# Patient Record
Sex: Male | Born: 1935 | Race: White | Hispanic: No | Smoking: Former smoker
Health system: Southern US, Community
[De-identification: ages and names within clinical notes are randomized; demographics above are authoritative.]

## PROBLEM LIST (undated history)

## (undated) DIAGNOSIS — E119 Type 2 diabetes mellitus without complications: Secondary | ICD-10-CM

## (undated) DIAGNOSIS — I10 Essential (primary) hypertension: Secondary | ICD-10-CM

## (undated) DIAGNOSIS — IMO0001 Reserved for inherently not codable concepts without codable children: Secondary | ICD-10-CM

## (undated) DIAGNOSIS — D649 Anemia, unspecified: Secondary | ICD-10-CM

## (undated) DIAGNOSIS — I350 Nonrheumatic aortic (valve) stenosis: Secondary | ICD-10-CM

## (undated) DIAGNOSIS — E785 Hyperlipidemia, unspecified: Secondary | ICD-10-CM

## (undated) DIAGNOSIS — I4891 Unspecified atrial fibrillation: Secondary | ICD-10-CM

## (undated) DIAGNOSIS — E7800 Pure hypercholesterolemia, unspecified: Secondary | ICD-10-CM

## (undated) DIAGNOSIS — I739 Peripheral vascular disease, unspecified: Secondary | ICD-10-CM

## (undated) DIAGNOSIS — M1990 Unspecified osteoarthritis, unspecified site: Secondary | ICD-10-CM

## (undated) HISTORY — PX: APPENDECTOMY: SHX54

---

## 2004-11-10 ENCOUNTER — Ambulatory Visit: Payer: Self-pay | Admitting: Family Medicine

## 2004-11-18 ENCOUNTER — Ambulatory Visit: Payer: Self-pay | Admitting: Family Medicine

## 2015-01-21 ENCOUNTER — Encounter: Payer: Self-pay | Admitting: Emergency Medicine

## 2015-01-21 ENCOUNTER — Emergency Department: Payer: Medicare Other

## 2015-01-21 ENCOUNTER — Inpatient Hospital Stay
Admission: EM | Admit: 2015-01-21 | Discharge: 2015-02-02 | DRG: 854 | Disposition: A | Discharge status: Skilled Nursing Facility | Payer: Medicare Other | Attending: Internal Medicine | Admitting: Internal Medicine

## 2015-01-21 ENCOUNTER — Inpatient Hospital Stay: Payer: Medicare Other

## 2015-01-21 DIAGNOSIS — E876 Hypokalemia: Secondary | ICD-10-CM | POA: Diagnosis not present

## 2015-01-21 DIAGNOSIS — L97512 Non-pressure chronic ulcer of other part of right foot with fat layer exposed: Secondary | ICD-10-CM | POA: Diagnosis present

## 2015-01-21 DIAGNOSIS — M1990 Unspecified osteoarthritis, unspecified site: Secondary | ICD-10-CM | POA: Diagnosis present

## 2015-01-21 DIAGNOSIS — E1140 Type 2 diabetes mellitus with diabetic neuropathy, unspecified: Secondary | ICD-10-CM | POA: Diagnosis present

## 2015-01-21 DIAGNOSIS — I10 Essential (primary) hypertension: Secondary | ICD-10-CM | POA: Diagnosis present

## 2015-01-21 DIAGNOSIS — E11621 Type 2 diabetes mellitus with foot ulcer: Secondary | ICD-10-CM | POA: Diagnosis present

## 2015-01-21 DIAGNOSIS — D329 Benign neoplasm of meninges, unspecified: Secondary | ICD-10-CM | POA: Diagnosis present

## 2015-01-21 DIAGNOSIS — E7800 Pure hypercholesterolemia, unspecified: Secondary | ICD-10-CM | POA: Diagnosis present

## 2015-01-21 DIAGNOSIS — R471 Dysarthria and anarthria: Secondary | ICD-10-CM | POA: Diagnosis present

## 2015-01-21 DIAGNOSIS — E785 Hyperlipidemia, unspecified: Secondary | ICD-10-CM | POA: Diagnosis present

## 2015-01-21 DIAGNOSIS — L02611 Cutaneous abscess of right foot: Secondary | ICD-10-CM | POA: Diagnosis present

## 2015-01-21 DIAGNOSIS — I70261 Atherosclerosis of native arteries of extremities with gangrene, right leg: Secondary | ICD-10-CM | POA: Diagnosis present

## 2015-01-21 DIAGNOSIS — Z79899 Other long term (current) drug therapy: Secondary | ICD-10-CM

## 2015-01-21 DIAGNOSIS — L03115 Cellulitis of right lower limb: Secondary | ICD-10-CM | POA: Diagnosis present

## 2015-01-21 DIAGNOSIS — R509 Fever, unspecified: Secondary | ICD-10-CM

## 2015-01-21 DIAGNOSIS — I350 Nonrheumatic aortic (valve) stenosis: Secondary | ICD-10-CM | POA: Diagnosis present

## 2015-01-21 DIAGNOSIS — Z87891 Personal history of nicotine dependence: Secondary | ICD-10-CM

## 2015-01-21 DIAGNOSIS — F329 Major depressive disorder, single episode, unspecified: Secondary | ICD-10-CM | POA: Diagnosis present

## 2015-01-21 DIAGNOSIS — Z7984 Long term (current) use of oral hypoglycemic drugs: Secondary | ICD-10-CM | POA: Diagnosis not present

## 2015-01-21 DIAGNOSIS — R296 Repeated falls: Secondary | ICD-10-CM | POA: Diagnosis present

## 2015-01-21 DIAGNOSIS — Z8042 Family history of malignant neoplasm of prostate: Secondary | ICD-10-CM | POA: Diagnosis not present

## 2015-01-21 DIAGNOSIS — R4781 Slurred speech: Secondary | ICD-10-CM

## 2015-01-21 DIAGNOSIS — A4101 Sepsis due to Methicillin susceptible Staphylococcus aureus: Secondary | ICD-10-CM | POA: Diagnosis present

## 2015-01-21 DIAGNOSIS — I70269 Atherosclerosis of native arteries of extremities with gangrene, unspecified extremity: Secondary | ICD-10-CM

## 2015-01-21 DIAGNOSIS — L03031 Cellulitis of right toe: Secondary | ICD-10-CM

## 2015-01-21 DIAGNOSIS — L0390 Cellulitis, unspecified: Secondary | ICD-10-CM | POA: Diagnosis present

## 2015-01-21 DIAGNOSIS — D638 Anemia in other chronic diseases classified elsewhere: Secondary | ICD-10-CM | POA: Diagnosis present

## 2015-01-21 DIAGNOSIS — I96 Gangrene, not elsewhere classified: Secondary | ICD-10-CM

## 2015-01-21 DIAGNOSIS — Z95828 Presence of other vascular implants and grafts: Secondary | ICD-10-CM

## 2015-01-21 DIAGNOSIS — H9190 Unspecified hearing loss, unspecified ear: Secondary | ICD-10-CM | POA: Diagnosis present

## 2015-01-21 DIAGNOSIS — E1152 Type 2 diabetes mellitus with diabetic peripheral angiopathy with gangrene: Secondary | ICD-10-CM | POA: Diagnosis present

## 2015-01-21 DIAGNOSIS — I739 Peripheral vascular disease, unspecified: Secondary | ICD-10-CM

## 2015-01-21 DIAGNOSIS — Z7982 Long term (current) use of aspirin: Secondary | ICD-10-CM

## 2015-01-21 DIAGNOSIS — L97509 Non-pressure chronic ulcer of other part of unspecified foot with unspecified severity: Secondary | ICD-10-CM

## 2015-01-21 DIAGNOSIS — I4891 Unspecified atrial fibrillation: Secondary | ICD-10-CM | POA: Diagnosis not present

## 2015-01-21 HISTORY — DX: Type 2 diabetes mellitus without complications: E11.9

## 2015-01-21 HISTORY — DX: Essential (primary) hypertension: I10

## 2015-01-21 HISTORY — DX: Pure hypercholesterolemia, unspecified: E78.00

## 2015-01-21 HISTORY — DX: Unspecified osteoarthritis, unspecified site: M19.90

## 2015-01-21 LAB — CBC WITH DIFFERENTIAL/PLATELET
BAND NEUTROPHILS: 5 %
BASOS PCT: 0 %
BLASTS: 0 %
Basophils Absolute: 0 10*3/uL (ref 0–0.1)
Eosinophils Absolute: 0 10*3/uL (ref 0–0.7)
Eosinophils Relative: 0 %
HCT: 35.8 % — ABNORMAL LOW (ref 40.0–52.0)
Hemoglobin: 10.9 g/dL — ABNORMAL LOW (ref 13.0–18.0)
LYMPHS PCT: 6 %
Lymphs Abs: 1.8 10*3/uL (ref 1.0–3.6)
MCH: 22.5 pg — AB (ref 26.0–34.0)
MCHC: 30.3 g/dL — ABNORMAL LOW (ref 32.0–36.0)
MCV: 74.3 fL — AB (ref 80.0–100.0)
METAMYELOCYTES PCT: 0 %
MYELOCYTES: 0 %
Monocytes Absolute: 0.6 10*3/uL (ref 0.2–1.0)
Monocytes Relative: 2 %
Neutro Abs: 28 10*3/uL — ABNORMAL HIGH (ref 1.4–6.5)
Neutrophils Relative %: 87 %
Other: 0 %
PLATELETS: 308 10*3/uL (ref 150–440)
PROMYELOCYTES ABS: 0 %
RBC: 4.81 MIL/uL (ref 4.40–5.90)
RDW: 18.4 % — AB (ref 11.5–14.5)
WBC: 30.4 10*3/uL — ABNORMAL HIGH (ref 3.8–10.6)
nRBC: 0 /100 WBC

## 2015-01-21 LAB — URINALYSIS COMPLETE WITH MICROSCOPIC (ARMC ONLY)
Bacteria, UA: NONE SEEN
Bilirubin Urine: NEGATIVE
GLUCOSE, UA: NEGATIVE mg/dL
Leukocytes, UA: NEGATIVE
Nitrite: NEGATIVE
Protein, ur: 100 mg/dL — AB
Specific Gravity, Urine: 1.024 (ref 1.005–1.030)
pH: 5 (ref 5.0–8.0)

## 2015-01-21 LAB — BASIC METABOLIC PANEL
ANION GAP: 13 (ref 5–15)
BUN: 23 mg/dL — AB (ref 6–20)
CALCIUM: 8.6 mg/dL — AB (ref 8.9–10.3)
CO2: 21 mmol/L — AB (ref 22–32)
CREATININE: 0.94 mg/dL (ref 0.61–1.24)
Chloride: 104 mmol/L (ref 101–111)
GFR calc Af Amer: >60 mL/min (ref >60)
GLUCOSE: 159 mg/dL — AB (ref 65–99)
Potassium: 4.2 mmol/L (ref 3.5–5.1)
Sodium: 138 mmol/L (ref 135–145)

## 2015-01-21 LAB — GLUCOSE, CAPILLARY: GLUCOSE-CAPILLARY: 154 mg/dL — AB (ref 65–99)

## 2015-01-21 MED ORDER — PIPERACILLIN-TAZOBACTAM 3.375 G IVPB
3.3750 g | Freq: Three times a day (TID) | INTRAVENOUS | Status: DC
  Administered 2015-01-22 – 2015-02-02 (×33): 3.375 g via INTRAVENOUS
  Filled 2015-01-21 (×38): qty 50

## 2015-01-21 MED ORDER — VANCOMYCIN HCL 10 G IV SOLR
1500.0000 mg | INTRAVENOUS | Status: DC
  Administered 2015-01-22: 1500 mg via INTRAVENOUS
  Filled 2015-01-21 (×3): qty 1500

## 2015-01-21 MED ORDER — ONDANSETRON HCL 4 MG PO TABS: 4.0000 mg | ORAL_TABLET | Freq: Four times a day (QID) | ORAL | Status: DC | PRN

## 2015-01-21 MED ORDER — FUROSEMIDE 40 MG PO TABS
40.0000 mg | ORAL_TABLET | Freq: Every day | ORAL | Status: DC
  Administered 2015-01-22 – 2015-02-02 (×10): 40 mg via ORAL
  Filled 2015-01-21 (×10): qty 1

## 2015-01-21 MED ORDER — DULOXETINE HCL 20 MG PO CPEP
20.0000 mg | ORAL_CAPSULE | Freq: Every day | ORAL | Status: DC
  Administered 2015-01-21 – 2015-02-02 (×12): 20 mg via ORAL
  Filled 2015-01-21 (×12): qty 1

## 2015-01-21 MED ORDER — ONDANSETRON HCL 4 MG/2ML IJ SOLN: 4.0000 mg | Freq: Once | INTRAMUSCULAR | Status: DC

## 2015-01-21 MED ORDER — ACETAMINOPHEN 650 MG RE SUPP
650.0000 mg | Freq: Four times a day (QID) | RECTAL | Status: DC | PRN
Start: 2015-01-21 — End: 2015-02-02

## 2015-01-21 MED ORDER — ASPIRIN EC 81 MG PO TBEC
81.0000 mg | DELAYED_RELEASE_TABLET | Freq: Every day | ORAL | Status: DC
  Administered 2015-01-21 – 2015-02-02 (×12): 81 mg via ORAL
  Filled 2015-01-21 (×12): qty 1

## 2015-01-21 MED ORDER — DOCUSATE SODIUM 100 MG PO CAPS
100.0000 mg | ORAL_CAPSULE | Freq: Every day | ORAL | Status: DC
  Administered 2015-01-21 – 2015-02-02 (×11): 100 mg via ORAL
  Filled 2015-01-21 (×12): qty 1

## 2015-01-21 MED ORDER — ONDANSETRON HCL 4 MG/2ML IJ SOLN: 4.0000 mg | Freq: Four times a day (QID) | INTRAMUSCULAR | Status: DC | PRN

## 2015-01-21 MED ORDER — INSULIN ASPART 100 UNIT/ML ~~LOC~~ SOLN
0.0000 [IU] | Freq: Three times a day (TID) | SUBCUTANEOUS | Status: DC
  Administered 2015-01-22 (×3): 1 [IU] via SUBCUTANEOUS
  Administered 2015-01-23 – 2015-01-25 (×4): 3 [IU] via SUBCUTANEOUS
  Administered 2015-01-25: 2 [IU] via SUBCUTANEOUS
  Administered 2015-01-26: 3 [IU] via SUBCUTANEOUS
  Administered 2015-01-26: 2 [IU] via SUBCUTANEOUS
  Administered 2015-01-27 (×2): 1 [IU] via SUBCUTANEOUS
  Administered 2015-01-28: 3 [IU] via SUBCUTANEOUS
  Administered 2015-01-28: 5 [IU] via SUBCUTANEOUS
  Administered 2015-01-28 – 2015-01-29 (×2): 1 [IU] via SUBCUTANEOUS
  Administered 2015-01-29: 3 [IU] via SUBCUTANEOUS
  Filled 2015-01-21: qty 3
  Filled 2015-01-21 (×4): qty 1
  Filled 2015-01-21: qty 5
  Filled 2015-01-21: qty 1
  Filled 2015-01-21 (×3): qty 3
  Filled 2015-01-21 (×2): qty 1
  Filled 2015-01-21: qty 2
  Filled 2015-01-21: qty 1
  Filled 2015-01-21: qty 3
  Filled 2015-01-21: qty 2
  Filled 2015-01-21 (×2): qty 3

## 2015-01-21 MED ORDER — PIPERACILLIN-TAZOBACTAM 3.375 G IVPB
3.3750 g | Freq: Once | INTRAVENOUS | Status: AC
  Administered 2015-01-21: 3.375 g via INTRAVENOUS
  Filled 2015-01-21: qty 50

## 2015-01-21 MED ORDER — VANCOMYCIN HCL IN DEXTROSE 1-5 GM/200ML-% IV SOLN
1000.0000 mg | Freq: Once | INTRAVENOUS | Status: AC
  Administered 2015-01-21: 1000 mg via INTRAVENOUS
  Filled 2015-01-21: qty 200

## 2015-01-21 MED ORDER — ACETAMINOPHEN 325 MG PO TABS: 650.0000 mg | ORAL_TABLET | Freq: Four times a day (QID) | ORAL | Status: DC | PRN

## 2015-01-21 MED ORDER — GI COCKTAIL ~~LOC~~: 30.0000 mL | Freq: Once | ORAL | Status: DC

## 2015-01-21 MED ORDER — VANCOMYCIN HCL 500 MG IV SOLR
500.0000 mg | INTRAVENOUS | Status: AC
  Administered 2015-01-22: 500 mg via INTRAVENOUS
  Filled 2015-01-21 (×2): qty 500

## 2015-01-21 MED ORDER — SIMVASTATIN 20 MG PO TABS
20.0000 mg | ORAL_TABLET | Freq: Every day | ORAL | Status: DC
  Administered 2015-01-21 – 2015-02-02 (×12): 20 mg via ORAL
  Filled 2015-01-21 (×12): qty 1

## 2015-01-21 MED ORDER — INSULIN ASPART 100 UNIT/ML ~~LOC~~ SOLN
0.0000 [IU] | Freq: Every day | SUBCUTANEOUS | Status: DC
  Administered 2015-01-28: 2 [IU] via SUBCUTANEOUS
  Filled 2015-01-21: qty 2

## 2015-01-21 MED ORDER — ENOXAPARIN SODIUM 40 MG/0.4ML ~~LOC~~ SOLN
40.0000 mg | SUBCUTANEOUS | Status: DC
Start: 2015-01-21 — End: 2015-01-24
  Administered 2015-01-21 – 2015-01-24 (×3): 40 mg via SUBCUTANEOUS
  Filled 2015-01-21 (×3): qty 0.4

## 2015-01-21 MED ORDER — LORAZEPAM 2 MG/ML IJ SOLN: 0.5000 mg | Freq: Once | INTRAMUSCULAR | Status: DC

## 2015-01-21 MED ORDER — SODIUM CHLORIDE 0.9 % IV BOLUS (SEPSIS): 1000.0000 mL | Freq: Once | INTRAVENOUS | Status: DC

## 2015-01-21 NOTE — ED Notes (Signed)
Admitting Dr. At bedside

## 2015-01-21 NOTE — H&P (Signed)
[facility] NAME: Spencer [name]    MR#:  [id number]  DATE OF BIRTH:  1917-12-19  DATE OF ADMISSION:  01/21/2015  PRIMARY CARE PHYSICIAN: Spencer Pink, MD   REQUESTING/REFERRING PHYSICIAN: Dr. Lisa [name]  CHIEF COMPLAINT:   Chief Complaint  Patient presents with  . Fall  . Weakness    HISTORY OF PRESENT ILLNESS:  Spencer [name]  is a 82 y.o. male with a known history of diabetes type 2 without complication, hypertension, osteoarthritis, hyperlipidemia who presents to the hospital due to recurrent falls and also noted to have right foot pain redness and swelling. She presented to the emergency room was noted to have a right foot cellulitis in a diabetic foot ulcer and hospitalist services were contacted further treatment and evaluation. Patient is very hard of hearing therefore the history is very limited. She did have a low-grade fever at the assisted living of 100.0. His any nausea, vomiting, abdominal pain, diarrhea, chest pain or any other associated symptoms. He did say that he's been having some right foot pain and swelling ongoing for the past week to 10 days but he did not notify anyone affect. He presented to the ER and was noted to have an acute cellulitis with a diabetic foot infection and hospice services were contacted further treatment and evaluation.  PAST MEDICAL HISTORY:   Past Medical History  Diagnosis Date  . Diabetes mellitus without complication ([place])   . Hypertension   . Arthritis   . High cholesterol     PAST SURGICAL HISTORY:   Past Surgical History  Procedure Laterality Date  . Appendectomy      SOCIAL HISTORY:   Social History  Substance Use Topics  . Smoking status: Former Research scientist (life sciences)  . Smokeless tobacco: Never Used  . Alcohol Use: No    FAMILY HISTORY:   Family History  Problem Relation Age of Onset  . Prostate cancer Father     DRUG ALLERGIES:  No Known Allergies  REVIEW  OF SYSTEMS:   Review of Systems  Constitutional: Negative for fever and weight loss.  HENT: Negative for congestion, nosebleeds and tinnitus.   Eyes: Negative for blurred vision, double vision and redness.  Respiratory: Negative for cough, hemoptysis and shortness of breath.   Cardiovascular: Negative for chest pain, orthopnea, leg swelling and PND.  Gastrointestinal: Negative for nausea, vomiting, abdominal pain, diarrhea and melena.  Genitourinary: Negative for dysuria, urgency and hematuria.  Musculoskeletal: Positive for falls. Negative for joint pain.  Neurological: Positive for weakness. Negative for dizziness, tingling, sensory change, focal weakness, seizures and headaches.  Endo/Heme/Allergies: Negative for polydipsia. Does not bruise/bleed easily.  Psychiatric/Behavioral: Negative for depression and memory loss. The patient is not nervous/anxious.     MEDICATIONS AT HOME:   Prior to Admission medications   Medication Sig Start Date End Date Taking? Authorizing Provider  aspirin EC 81 MG tablet Take 81 mg by mouth daily.   Yes Historical Provider, MD  celecoxib (CELEBREX) 200 MG capsule Take 200 mg by mouth daily as needed for mild pain.   Yes Historical Provider, MD  docusate sodium (COLACE) 100 MG capsule Take 100 mg by mouth daily.   Yes Historical Provider, MD  DULoxetine (CYMBALTA) 20 MG capsule Take 20 mg by mouth daily.   Yes Historical Provider, MD  furosemide (LASIX) 40 MG tablet Take 40 mg by mouth daily.   Yes Historical Provider, MD  metFORMIN (GLUCOPHAGE-XR) 500 MG 24 hr tablet  Take 500 mg by mouth daily.   Yes Historical Provider, MD  simvastatin (ZOCOR) 20 MG tablet Take 20 mg by mouth daily.   Yes Historical Provider, MD      VITAL SIGNS:  Blood pressure 133/54, pulse 74, temperature 97.5 F (36.4 C), temperature source Oral, resp. rate 20, height 6' (1.829 m), weight 95.255 kg (210 lb), SpO2 96 %.  PHYSICAL EXAMINATION:  Physical Exam  GENERAL:  82  y.o.-year-old patient lying in the bed with no acute distress.  EYES: Pupils equal, round, reactive to light and accommodation. No scleral icterus. Extraocular muscles intact.  HEENT: Head atraumatic, normocephalic. Oropharynx and nasopharynx clear. No oropharyngeal erythema, moist oral mucosa  NECK:  Supple, no jugular venous distention. No thyroid enlargement, no tenderness.  LUNGS: Normal breath sounds bilaterally, no wheezing, rales, rhonchi. No use of accessory muscles of respiration.  CARDIOVASCULAR: S1, S2 RRR.  2/6 systolic ejection murmur at the left sternal border, No rubs, gallops, clicks.  ABDOMEN: Soft, nontender, nondistended. Bowel sounds present. No organomegaly or mass.  EXTREMITIES: No pedal edema, cyanosis, or clubbing. + 2 pedal & radial pulses b/l. Right foot swelling and redness. There is a open sore with a fluid collection on the plantar aspect of the right foot.  NEUROLOGIC: Cranial nerves II through XII are intact. No focal Motor or sensory deficits appreciated b/l PSYCHIATRIC: The patient is alert and oriented x 3. Good affect.  SKIN: No obvious rash, lesion, right lower extremity cellulitis with a open sore on the right foot w/ some serosangenous drainage.    LABORATORY PANEL:   CBC  Recent Labs Lab 01/21/15 1700  WBC 30.4*  HGB 10.9*  HCT 35.8*  PLT 308   ------------------------------------------------------------------------------------------------------------------  Chemistries   Recent Labs Lab 01/21/15 1700  NA 138  K 4.2  CL 104  CO2 21*  GLUCOSE 159*  BUN 23*  CREATININE 0.94  CALCIUM 8.6*   ------------------------------------------------------------------------------------------------------------------  Cardiac Enzymes No results for input(s): TROPONINI in the last 168 hours. ------------------------------------------------------------------------------------------------------------------  RADIOLOGY:  Dg Foot Complete  Right  01/21/2015  CLINICAL DATA:  Diabetic foot ulcers with pain, swelling and redness. EXAM: RIGHT FOOT COMPLETE - 3+ VIEW COMPARISON:  None. FINDINGS: Diffuse soft tissue swelling/edema involving the foot and ankle. Extensive vascular calcifications. There are moderate degenerative changes but no definite destructive bony changes to suggest osteomyelitis. Pes cavus noted. IMPRESSION: Degenerative changes but no definite plain film findings for osteomyelitis. Diffuse soft tissue swelling/edema. Electronically Signed   By: Marijo Sanes M.D.   On: 01/21/2015 18:45     IMPRESSION AND PLAN:   82 year old male with past medical history of diabetes, hypertension, diabetic neuropathy, hyperlipidemia, history of arthritis of presents to the hospital due to falls and also noted to have a right foot cellulitis and diabetic foot ulcer.  #1 right foot cellulitis with diabetic foot ulcer-this is the cause of patient's redness swelling on the foot. -I will treat the patient with IV antibiotics with vancomycin and Zosyn. Follow blood cultures. -Patient's x-rays negative for any evidence of osteomyelitis.I will get a podiatry consult in the morning. -Continue supportive care.  #2 sepsis-patient was then given the right foot cellulitis, tachycardia, leukocytosis. -Continue IV antibiotics as mentioned above. Follow blood cultures.  #3 leukocytosis-secondary to the site of sepsis/cellulitis. -Follow WBC count after antibiotic therapy.  #4 diabetes type 2 without, dictation-hold metformin. Continue sliding scale insulin for now.  #5 Depression - cont. Cymbalta.   #6 Hyperlipidemia - cont. Simvastatin.    All  the records are reviewed and case discussed with ED provider. Management plans discussed with the patient, family and they are in agreement.  CODE STATUS: Full  TOTAL TIME TAKING CARE OF THIS PATIENT: 45 minutes.    Henreitta Leber M.D on 01/21/2015 at 8:28 PM  Between 7am to 6pm - Pager -  [phone]  After 6pm go to [email or web address] - password [email or web address] [facility]  [facility]  Office  [phone]  CC: Primary care physician; Spencer Pink, MD

## 2015-01-21 NOTE — ED Provider Notes (Signed)
[facility] Emergency Department Provider Note   ____________________________________________  Time seen: Approximately 6 PM I have reviewed the triage vital signs and the triage nursing note.  HISTORY  Chief Complaint Fall and Weakness   Historian Patient  HPI Spencer [name] is a 82 y.o. male who is hard of hearing, but lives at [facility], is here after he had a fall which she describes as losing his balance when he leaned forward. He was unable to get up and so EMS was called. After evaluating him and seeing the state of his right foot, he was brought to the ED for further evaluation. Patient states his foot has been red for about a week, and the area over the ball the foot has been greenish/whitest since yesterday. He does have some pain in that foot. He denies a fever or nausea. Nodes are moderate.    Past Medical History  Diagnosis Date  . Diabetes mellitus without complication ([place])   . Hypertension   . Arthritis   . High cholesterol     There are no active problems to display for this patient.   Past Surgical History  Procedure Laterality Date  . Appendectomy      No current outpatient prescriptions on file.  Allergies Review of patient's allergies indicates no known allergies.  History reviewed. No pertinent family history.  Social History Social History  Substance Use Topics  . Smoking status: Former Research scientist (life sciences)  . Smokeless tobacco: Never Used  . Alcohol Use: No    Review of Systems  Constitutional: Negative for fever. Eyes: Negative for visual changes. ENT: Negative for sore throat. Cardiovascular: Negative for chest pain. Respiratory: Negative for shortness of breath. Gastrointestinal: Negative for abdominal pain, vomiting and diarrhea. Genitourinary: Negative for dysuria. Musculoskeletal: Negative for back pain. Skin: Redness to right foot with swelling Neurological: Negative for headache. 10 point  Review of Systems otherwise negative ____________________________________________   PHYSICAL EXAM:  VITAL SIGNS: ED Triage Vitals  Enc Vitals Group     BP 01/21/15 1700 140/67 mmHg     Pulse Rate 01/21/15 1700 74     Resp 01/21/15 1700 22     Temp 01/21/15 1709 97.5 F (36.4 C)     Temp Source 01/21/15 1709 Oral     SpO2 01/21/15 1700 97 %     Weight 01/21/15 1709 210 lb (95.255 kg)     Height 01/21/15 1709 6' (1.829 m)     Head Cir --      Peak Flow --      Pain Score --      Pain Loc --      Pain Edu? --      Excl. in [place]? --      Constitutional: Alert and oriented. Heart of hearing. Well appearing overall and in no distress. Eyes: Conjunctivae are normal. PERRL. Normal extraocular movements. ENT   Head: Normocephalic and atraumatic.   Nose: No congestion/rhinnorhea.   Mouth/Throat: Mucous membranes are moist.   Neck: No stridor. Cardiovascular/Chest: Normal rate, regular rhythm.  No murmurs, rubs, or gallops. Respiratory: Normal respiratory effort without tachypnea nor retractions. Breath sounds are clear and equal bilaterally. No wheezes/rales/rhonchi. Gastrointestinal: Soft. No distention, no guarding, no rebound. Nontender.  Genitourinary/rectal:Deferred Musculoskeletal: Right foot cellulitis with erythema, warmth, and swelling from the foot up to the mid calf. Right ball of the foot with necrotic/gangrenous area. Neurologic:  Normal speech and language. No gross or focal neurologic deficits are appreciated. Skin:  See  musculoskeletal exam Psychiatric: Mood and affect are normal. Speech and behavior are normal. Patient exhibits appropriate insight and judgment.  ____________________________________________   EKG I, Lisa Roca, MD, the attending physician have personally viewed and interpreted all ECGs.  Undetermined irregular rhythm with nonspecific intraventricular conduction delay. Normal axis. Nonspecific ST and  T-wave ____________________________________________  LABS (pertinent positives/negatives)  Urinalysis negative for UTI Basic metabolic panel without significant abnormality  White blood count 30.4, hemoglobin 10.9 ____________________________________________  RADIOLOGY All Xrays were viewed by me. Imaging interpreted by Radiologist.  X-ray right foot: Degenerative changes but no definite plain film findings prostate myelitis. Diffuse soft tissue swelling/edema  Portable chest 1 view: No active disease __________________________________________  PROCEDURES  Procedure(s) performed: None  Critical Care performed: None  ____________________________________________   ED COURSE / ASSESSMENT AND PLAN  CONSULTATIONS: Hospitalist for admission  Pertinent labs & imaging results that were available during my care of the patient were reviewed by me and considered in my medical decision making (see chart for details).  The initial complaint was multiple falls, but no reported head injuries, nor evidence of traumatic injury on exam.  Of more concern is the right foot cellulitis with an area of necrosis/gangrene. Low-grade temperature on exam. Patient was treated with vancomycin and Zosyn IV and will be admitted for further treatment and management. He will need a podiatry consultation tomorrow with likely debridement. He does not appear to be septic, but blood cultures were sent.    Patient / Family / Caregiver informed of clinical course, medical decision-making process, and agree with plan.  ___________________________________________   FINAL CLINICAL IMPRESSION(S) / ED DIAGNOSES   Final diagnoses:  Cellulitis of right foot  Gangrene of foot ([place])              Note: This dictation was prepared with Dragon dictation. Any transcriptional errors that result from this process are unintentional   Lisa Roca, MD 01/21/15 2336

## 2015-01-21 NOTE — ED Notes (Signed)
Tried to pull Zosyn from pyxis and was unable. Called pharmacy and they are sending it to ED.

## 2015-01-21 NOTE — ED Notes (Signed)
Pt presents via [facility] from [facility]. Per EMS pt fell today and has had several recent episodes of falls. EMS reports a temp of 100 degrees. EMS states that per [place] pt has redness noted to R leg and foot as well as weakness. Pt is hard of hearing at this time and has 2 hearing aids.

## 2015-01-21 NOTE — ED Notes (Signed)
Report given to Kate, RN

## 2015-01-21 NOTE — Progress Notes (Signed)
ANTIBIOTIC CONSULT NOTE - INITIAL  Pharmacy Consult for Vancomycin/Zosyn  Indication: cellulitis, sepsis  No Known Allergies  Patient Measurements: Height: 5\' 6"  (167.6 cm) Weight: 161 lb 12.8 oz (73.392 kg) IBW/kg (Calculated) : 63.8 Adjusted Body Weight: 84.7 kg   Vital Signs: Temp: 98.6 F (37 C) (01/11 2119) Temp Source: Oral (01/11 1709) BP: 161/40 mmHg (01/11 2119) Pulse Rate: 78 (01/11 2119) Intake/Output from previous day:   Intake/Output from this shift: Total I/O In: -  Out: 175 [Urine:175]  Labs:  Recent Labs  01/21/15 1700  WBC 30.4*  HGB 10.9*  PLT 308  CREATININE 0.94   Estimated Creatinine Clearance: 42.4 mL/min (by C-G formula based on Cr of 0.94). No results for input(s): VANCOTROUGH, VANCOPEAK, VANCORANDOM, GENTTROUGH, GENTPEAK, GENTRANDOM, TOBRATROUGH, TOBRAPEAK, TOBRARND, AMIKACINPEAK, AMIKACINTROU, AMIKACIN in the last 72 hours.   Microbiology: No results found for this or any previous visit (from the past 720 hour(s)).  Medical History: Past Medical History  Diagnosis Date  . Diabetes mellitus without complication ([place])   . Hypertension   . Arthritis   . High cholesterol     Medications:  Prescriptions prior to admission  Medication Sig Dispense Refill Last Dose  . aspirin EC 81 MG tablet Take 81 mg by mouth daily.   unknown at unknown  . celecoxib (CELEBREX) 200 MG capsule Take 200 mg by mouth daily as needed for mild pain.   PRN at PRN  . docusate sodium (COLACE) 100 MG capsule Take 100 mg by mouth daily.   unknown at unknown  . DULoxetine (CYMBALTA) 20 MG capsule Take 20 mg by mouth daily.   unknown at unknown  . furosemide (LASIX) 40 MG tablet Take 40 mg by mouth daily.   unknown at unknown   . metFORMIN (GLUCOPHAGE-XR) 500 MG 24 hr tablet Take 500 mg by mouth daily.   unknown at unknown  . simvastatin (ZOCOR) 20 MG tablet Take 20 mg by mouth daily.   unknown at unknown   Assessment: No Pseudomonas risk factors noted.  CrCl =  42.4 ml/min Ke = 0.05hr-1 T1/2 = 13.9 hrs Vd = 66.7 hrs  Goal of Therapy:  Vancomycin trough level 15-20 mcg/ml  Plan:  Expected duration 7 days with resolution of temperature and/or normalization of WBC   Zosyn 3.375 gm IV X 1 given on 1/11 @ 20:00. Zosyn 3.375 gm IV Q8H EI ordered to start 1/12 @ 4:00.  Vancomycin 1 gm IV X 1 given on 1/11 @ 19:00. Vancomycin 500 mg IV X 1 ordered to be given following initial 1 gm dose to make total loading dose of 1500 mg. Vancomycin 1500 mg IV X Q18H ordered to start on 1/12 @ 4:00, ~ 9 hrs after 1st dose (stacked dosing). This pt will reach Css by 1/14 @ 19:00. Will draw 1st trough on 1/14 @ 9:30, which will be very close to Css.   Timesha Cervantez D 01/21/2015,9:38 PM

## 2015-01-22 LAB — GLUCOSE, CAPILLARY
GLUCOSE-CAPILLARY: 132 mg/dL — AB (ref 65–99)
GLUCOSE-CAPILLARY: 140 mg/dL — AB (ref 65–99)
GLUCOSE-CAPILLARY: 154 mg/dL — AB (ref 65–99)
Glucose-Capillary: 142 mg/dL — ABNORMAL HIGH (ref 65–99)

## 2015-01-22 LAB — BASIC METABOLIC PANEL
ANION GAP: 7 (ref 5–15)
BUN: 19 mg/dL (ref 6–20)
CALCIUM: 7.9 mg/dL — AB (ref 8.9–10.3)
CO2: 22 mmol/L (ref 22–32)
CREATININE: 0.87 mg/dL (ref 0.61–1.24)
Chloride: 109 mmol/L (ref 101–111)
GLUCOSE: 128 mg/dL — AB (ref 65–99)
Potassium: 4 mmol/L (ref 3.5–5.1)
Sodium: 138 mmol/L (ref 135–145)

## 2015-01-22 LAB — CBC
HCT: 31.4 % — ABNORMAL LOW (ref 40.0–52.0)
HEMOGLOBIN: 9.6 g/dL — AB (ref 13.0–18.0)
MCH: 22.5 pg — AB (ref 26.0–34.0)
MCHC: 30.7 g/dL — AB (ref 32.0–36.0)
MCV: 73.2 fL — ABNORMAL LOW (ref 80.0–100.0)
PLATELETS: 299 10*3/uL (ref 150–440)
RBC: 4.28 MIL/uL — AB (ref 4.40–5.90)
RDW: 17.6 % — ABNORMAL HIGH (ref 11.5–14.5)
WBC: 22.9 10*3/uL — ABNORMAL HIGH (ref 3.8–10.6)

## 2015-01-22 MED ORDER — ENSURE ENLIVE PO LIQD
237.0000 mL | Freq: Three times a day (TID) | ORAL | Status: DC
  Administered 2015-01-22 – 2015-02-02 (×25): 237 mL via ORAL

## 2015-01-22 MED ORDER — SODIUM CHLORIDE 0.9 % IV SOLN
1500.0000 mg | INTRAVENOUS | Status: DC
  Administered 2015-01-23: 1500 mg via INTRAVENOUS
  Filled 2015-01-22: qty 1500

## 2015-01-22 NOTE — Care Management Note (Signed)
Case Management Note  Patient Details  Name: Spencer [name] MRN: [id number] Date of Birth: 12/31/1917  Subjective/Objective:                  Met with patient to discuss discharge planning. He is admitted for cellulitis so RNCM/CSW will follow for wound care and IV ABX at discharge. He states he is "independent with one cane in his car (he drives) and one cane in the house". His PCP is Dr. Verneda Skill. He lives at [facility] alone- wife passed away in 03/17/11 per patient. He has a son that visited last night he said and he lives in [place].   Action/Plan: RNCM will continue to follow. I have updated CSW.   Expected Discharge Date:                  Expected Discharge Plan:     In-House Referral:  Clinical Social Work  Discharge planning Services  CM Consult  Post Acute Care Choice:    Choice offered to:  Patient  DME Arranged:    DME Agency:     HH Arranged:    [place] Agency:     Status of Service:  In process, will continue to follow  Medicare Important Message Given:    Date Medicare IM Given:    Medicare IM give by:    Date Additional Medicare IM Given:    Additional Medicare Important Message give by:     If discussed at [place] of Stay Meetings, dates discussed:    Additional Comments:  Marshell Garfinkel, RN 01/22/2015, 9:48 AM

## 2015-01-22 NOTE — Consult Note (Signed)
ORTHOPAEDIC CONSULTATION  REQUESTING PHYSICIAN: Bettey Costa, MD  Chief Complaint: Right great toe abscess  HPI: Spencer [name] is a 82 y.o. male who complains of  redness and swelling to his right great toe. Son presents with him today and provides the review of system. Noticed a couple days ago pain and swelling to his right great toe joint. A recent shortness of breath occurred and he was brought to the ER. At that Time they noticed the redness and swelling to his right great toe and admitted for diabetic foot infection.  Past Medical History  Diagnosis Date  . Diabetes mellitus without complication ([place])   . Hypertension   . Arthritis   . High cholesterol    Past Surgical History  Procedure Laterality Date  . Appendectomy     Social History   Social History  . Marital Status: Widowed    Spouse Name: N/A  . Number of Children: N/A  . Years of Education: N/A   Social History Main Topics  . Smoking status: Former Research scientist (life sciences)  . Smokeless tobacco: Never Used  . Alcohol Use: No  . Drug Use: No  . Sexual Activity: Not Asked   Other Topics Concern  . None   Social History Narrative   Family History  Problem Relation Age of Onset  . Prostate cancer Father    No Known Allergies Prior to Admission medications   Medication Sig Start Date End Date Taking? Authorizing Provider  aspirin EC 81 MG tablet Take 81 mg by mouth daily.   Yes Historical Provider, MD  celecoxib (CELEBREX) 200 MG capsule Take 200 mg by mouth daily as needed for mild pain.   Yes Historical Provider, MD  docusate sodium (COLACE) 100 MG capsule Take 100 mg by mouth daily.   Yes Historical Provider, MD  DULoxetine (CYMBALTA) 20 MG capsule Take 20 mg by mouth daily.   Yes Historical Provider, MD  furosemide (LASIX) 40 MG tablet Take 40 mg by mouth daily.   Yes Historical Provider, MD  metFORMIN (GLUCOPHAGE-XR) 500 MG 24 hr tablet Take 500 mg by mouth daily.   Yes Historical Provider, MD  simvastatin (ZOCOR)  20 MG tablet Take 20 mg by mouth daily.   Yes Historical Provider, MD   Dg Chest [facility] 1 View  01/21/2015  CLINICAL DATA:  Chest pain. Fever. Recent fall. Diabetes and hypertension. EXAM: PORTABLE CHEST 1 VIEW COMPARISON:  None. FINDINGS: The heart size and mediastinal contours are within normal limits. Both lungs are clear. No evidence of pneumothorax or pleural effusion. IMPRESSION: No active disease. Electronically Signed   By: Earle Gell M.D.   On: 01/21/2015 21:14   Dg Foot Complete Right  01/21/2015  CLINICAL DATA:  Diabetic foot ulcers with pain, swelling and redness. EXAM: RIGHT FOOT COMPLETE - 3+ VIEW COMPARISON:  None. FINDINGS: Diffuse soft tissue swelling/edema involving the foot and ankle. Extensive vascular calcifications. There are moderate degenerative changes but no definite destructive bony changes to suggest osteomyelitis. Pes cavus noted. IMPRESSION: Degenerative changes but no definite plain film findings for osteomyelitis. Diffuse soft tissue swelling/edema. Electronically Signed   By: Marijo Sanes M.D.   On: 01/21/2015 18:45    Positive ROS: All other systems have been reviewed and were otherwise negative with the exception of those mentioned in the HPI and as above.  12 point ROS was performed.  Physical Exam: General: Alert and oriented.  No apparent distress.  Vascular:  Left foot:Dorsalis Pedis:  present Posterior Tibial:  present  Right foot: Dorsalis Pedis:  present Posterior Tibial:  diminished secondary to edema  Neuro:absent protective sensation.  Derm: There was a noted blister with ulceration to the plantar right first MTPJ. I was able to remove and do roof the blister sharply today with a 15 blade and tissue nipper down to the normal epidermis. At that time there was noted a abscessed area at the plantar medial aspect of the first MTPJ. I was able to sharply incise this and purulent drainage was noted. I took a wound culture at this time. Excisional  debridement with a 15 blade and scissors was performed to the plantar medial right first MTPJ. This was down to the subcutaneous tissue. There was noted surrounding cellulitis to the right foot.  Ortho/MS: Left foot has full muscle strength and full range of motion to all major muscles.  Right lower extremity has noted diffuse edema.   X-rays were evaluated and negative for obvious osteomyelitis.  Assessment: Diabetic foot ulcer with abscess.  Plan: Excisional debridement with I&D of the abscess was performed at bedside today without anesthesia. He does need formal debridement as there was further residual necrotic and infected tissue. I discussed surgical procedure in detail with the father and son. The risks benefits alternatives and competitions were discussed with the patient for an consent has been given. I will see the patient tomorrow. The patient is nothing by mouth for now. Culture was sent for routine ulcer and sensitivities.    Elesa Hacker, DPM Cell [phone]   01/22/2015 5:53 PM

## 2015-01-22 NOTE — NC FL2 (Signed)
[place] LEVEL OF CARE SCREENING TOOL     IDENTIFICATION  Patient Name: Spencer [name] Birthdate: 07-10-1917 Sex: male Admission Date (Current Location): 01/21/2015  [facility] and [place] Number:  Engineering geologist and Address:  [facility], [address], [place], [place] [address]      Provider Number: [id number]  Attending Physician Name and Address:  Bettey Costa, MD  Relative Name and Phone Number:       Current Level of Care: Hospital Recommended Level of Care: [place] Prior Approval Number:    Date Approved/Denied:   PASRR Number: pending  Discharge Plan: SNF    Current Diagnoses: Patient Active Problem List   Diagnosis Date Noted  . Cellulitis 01/21/2015    Orientation RESPIRATION BLADDER Height & Weight    Self, Time, Situation, Place  Normal Incontinent   161 lbs.  BEHAVIORAL SYMPTOMS/MOOD NEUROLOGICAL BOWEL NUTRITION STATUS   (none)  (none) Incontinent Diet  AMBULATORY STATUS COMMUNICATION OF NEEDS Skin   Extensive Assist Verbally Normal                       Personal Care Assistance Level of Assistance  Bathing, Dressing, Feeding Bathing Assistance: Limited assistance Feeding assistance: Limited assistance Dressing Assistance: Limited assistance     Functional Limitations Info  Hearing   Hearing Info: Impaired      SPECIAL CARE FACTORS FREQUENCY  PT (By licensed PT)                    Contractures Contractures Info: Not present    Additional Factors Info  Code Status, Allergies Code Status Info: Full Allergies Info: NKA           Current Medications (01/22/2015):  This is the current hospital active medication list Current Facility-Administered Medications  Medication Dose Route Frequency Provider Last Rate Last Dose  . acetaminophen (TYLENOL) tablet 650 mg  650 mg Oral Q6H PRN Henreitta Leber, MD       Or  . acetaminophen (TYLENOL) suppository 650 mg  650 mg  Rectal Q6H PRN Henreitta Leber, MD      . aspirin EC tablet 81 mg  81 mg Oral Daily Henreitta Leber, MD   81 mg at 01/22/15 [id number]  . docusate sodium (COLACE) capsule 100 mg  100 mg Oral Daily Henreitta Leber, MD   100 mg at 01/22/15 [id number]  . DULoxetine (CYMBALTA) DR capsule 20 mg  20 mg Oral Daily Henreitta Leber, MD   20 mg at 01/22/15 [id number]  . enoxaparin (LOVENOX) injection 40 mg  40 mg Subcutaneous Q24H Henreitta Leber, MD   40 mg at 01/21/15 2147  . furosemide (LASIX) tablet 40 mg  40 mg Oral Daily Henreitta Leber, MD   40 mg at 01/22/15 [id number]  . insulin aspart (novoLOG) injection 0-5 Units  0-5 Units Subcutaneous QHS Henreitta Leber, MD   0 Units at 01/21/15 2200  . insulin aspart (novoLOG) injection 0-9 Units  0-9 Units Subcutaneous TID WC Henreitta Leber, MD   1 Units at 01/22/15 [phone]  . ondansetron (ZOFRAN) tablet 4 mg  4 mg Oral Q6H PRN Henreitta Leber, MD       Or  . ondansetron (ZOFRAN) injection 4 mg  4 mg Intravenous Q6H PRN Henreitta Leber, MD      . piperacillin-tazobactam (ZOSYN) IVPB 3.375 g  3.375 g Intravenous 3 times per day Sital  Mody, MD   3.375 g at 01/22/15 0545  . simvastatin (ZOCOR) tablet 20 mg  20 mg Oral Daily Henreitta Leber, MD   20 mg at 01/22/15 0923  . [START ON 01/23/2015] vancomycin (VANCOCIN) 1,500 mg in sodium chloride 0.9 % 500 mL IVPB  1,500 mg Intravenous Q24H Bettey Costa, MD         Discharge Medications: Please see discharge summary for a list of discharge medications.  Relevant Imaging Results:  Relevant Lab Results:   Additional Information SS: [id number]  Shela Leff, LCSW

## 2015-01-22 NOTE — Progress Notes (Signed)
ANTIBIOTIC CONSULT NOTE - INITIAL  Pharmacy Consult for Vancomycin/Zosyn  Indication: cellulitis, sepsis  No Known Allergies  Patient Measurements: Height: 5\' 6"  (167.6 cm) Weight: 161 lb 12.8 oz (73.392 kg) IBW/kg (Calculated) : 63.8 Adjusted Body Weight: 84.7 kg   Vital Signs: Temp: 99.1 F (37.3 C) (01/12 0742) Temp Source: Oral (01/12 0742) BP: 144/68 mmHg (01/12 0742) Pulse Rate: 80 (01/12 0742) Intake/Output from previous day: 01/11 0701 - 01/12 0700 In: -  Out: 175 [Urine:175] Intake/Output from this shift:    Labs:  Recent Labs  01/21/15 1700 01/22/15 0452  WBC 30.4* 22.9*  HGB 10.9* 9.6*  PLT 308 299  CREATININE 0.94 0.87   Estimated Creatinine Clearance: 45.8 mL/min (by C-G formula based on Cr of 0.87). No results for input(s): VANCOTROUGH, VANCOPEAK, VANCORANDOM, GENTTROUGH, GENTPEAK, GENTRANDOM, TOBRATROUGH, TOBRAPEAK, TOBRARND, AMIKACINPEAK, AMIKACINTROU, AMIKACIN in the last 72 hours.   Microbiology: No results found for this or any previous visit (from the past 720 hour(s)).  Medical History: Past Medical History  Diagnosis Date  . Diabetes mellitus without complication ([place])   . Hypertension   . Arthritis   . High cholesterol     Medications:  Prescriptions prior to admission  Medication Sig Dispense Refill Last Dose  . aspirin EC 81 MG tablet Take 81 mg by mouth daily.   unknown at unknown  . celecoxib (CELEBREX) 200 MG capsule Take 200 mg by mouth daily as needed for mild pain.   PRN at PRN  . docusate sodium (COLACE) 100 MG capsule Take 100 mg by mouth daily.   unknown at unknown  . DULoxetine (CYMBALTA) 20 MG capsule Take 20 mg by mouth daily.   unknown at unknown  . furosemide (LASIX) 40 MG tablet Take 40 mg by mouth daily.   unknown at unknown   . metFORMIN (GLUCOPHAGE-XR) 500 MG 24 hr tablet Take 500 mg by mouth daily.   unknown at unknown  . simvastatin (ZOCOR) 20 MG tablet Take 20 mg by mouth daily.   unknown at unknown    Assessment: 82 y/o F with sepsis and diabetic foot ulcer without osteomyelitis. Patient received 3000 mg of vancomycin in last 24 hours due to stacked dosing. Vancomycin currently dosed 1500 mg iv q 18 hours.   Ke= 0.045 h-1 Vd= 51.4 L  Goal of Therapy:  Vancomycin trough level 15-20 mcg/ml  Plan:  Continue Zosyn 3.375 g EI q 8 hours.   Will change vancomycin dosing to 1500 mg iv q 24 hours. Will check a trough with the 4th dose of this regimens.   Will continue to follow renal function and culture results.   Ulice Dash D 01/22/2015,9:01 AM

## 2015-01-22 NOTE — Clinical Social Work Note (Signed)
Clinical Social Work Assessment  Patient Details  Name: Spencer [name] MRN: [id number] Date of Birth: November 01, 1917  Date of referral:  01/22/15               Reason for consult:  Facility Placement                Permission sought to share information with:    Permission granted to share information::     Name::        Agency::     Relationship::     Contact Information:     Housing/Transportation Living arrangements for the past 2 months:  [place] of Information:  Adult Children Patient Interpreter Needed:  None Criminal Activity/Legal Involvement Pertinent to Current Situation/Hospitalization:  No - Comment as needed Significant Relationships:  Adult Children Lives with:  Self Do you feel safe going back to the place where you live?    Need for family participation in patient care:     Care giving concerns:  Patient resides alone at [place] [facility] [place]   Social Worker assessment / plan:  CSW notified by Spencer [name] at [facility] that patient is in their independent living section and that patient will have to transition to their healthcare building. Spencer [name] stated that patient was found in feces on the floor and that patient's apartment was not clean and food had been left out since what appeared to be Christmas time.   CSW attempted to speak with patient but he is very hard of hearing and was sleeping soundly. CSW contacted patient's son: Spencer [id number] and Spencer [name] stated that he would be in agreement with patient transitioning to the healthcare building at [facility]. Spencer [name] was talking to me as he was arriving to visit his dad. When he arrived, I showed him to patient's room so he could visit.   Employment status:  Retired Forensic scientist:  Medicare PT Recommendations:  Not assessed at this time Information / Referral to community resources:     Patient/Family's Response to care:  Patient's son expressed  appreciation for CSW assistance.  Patient/Family's Understanding of and Emotional Response to Diagnosis, Current Treatment, and Prognosis:  Patient's son is open to recommendations for transition to [facility] at discharge from hospital.  Emotional Assessment Appearance:  Appears stated age Attitude/Demeanor/Rapport:   (sleeping and unable to assess) Affect (typically observed):   (sleeping and unable to assess) Orientation:    Alcohol / Substance use:  Not Applicable Psych involvement (Current and /or in the community):  No (Comment)  Discharge Needs  Concerns to be addressed:  No discharge needs identified Readmission within the last 30 days:  No Current discharge risk:  None Barriers to Discharge:  No Barriers Identified   Spencer Leff, LCSW 01/22/2015, 11:26 AM

## 2015-01-22 NOTE — Progress Notes (Signed)
Initial Nutrition Assessment   INTERVENTION:   Meals and Snacks: Cater to patient preferences; will recommend liberalizing diet order to regular if po intake remains poor Medical Food Supplement Therapy: will recommend Ensure Enlive po TID, each supplement provides 350 kcal and 20 grams of protein, and will watch blood glucose if consuming well, may need to switch to Glucerna however Glucerna is only 10g protein Coordination of Care: will continue to assess for malnutrition as able using ASPEN/AND guidelines   NUTRITION DIAGNOSIS:   Inadequate oral intake related to acute illness as evidenced by per patient/family report.  GOAL:   Patient will meet greater than or equal to 90% of their needs  MONITOR:    (Energy Intake, Electrolyte and Renal Profile, Anthropometrics, Digestive system, Glucose Profile)  REASON FOR ASSESSMENT:   Malnutrition Screening Tool    ASSESSMENT:   Pt admitted with right foot cellulitis seconary to diabetic foot ulcer and sepsis. Pt HOH.  Past Medical History  Diagnosis Date  . Diabetes mellitus without complication ([place])   . Hypertension   . Arthritis   . High cholesterol      Diet Order:  Diet heart healthy/carb modified Room service appropriate?: Yes; Fluid consistency:: Thin    Current Nutrition: Pt reports eating bites of lunch and breakfast this am.   Food/Nutrition-Related History: Pt's son reports appetite has been less recently for example pt's daughter would fix pt chicken and he used to eat half and save half now he eats 1/3 and saves the other 2/3. Pt reports drinking Boost at home and liking.   Scheduled Medications:  . aspirin EC  81 mg Oral Daily  . docusate sodium  100 mg Oral Daily  . DULoxetine  20 mg Oral Daily  . enoxaparin (LOVENOX) injection  40 mg Subcutaneous Q24H  . feeding supplement (ENSURE ENLIVE)  237 mL Oral TID WC  . furosemide  40 mg Oral Daily  . insulin aspart  0-5 Units Subcutaneous QHS  . insulin aspart   0-9 Units Subcutaneous TID WC  . piperacillin-tazobactam (ZOSYN)  IV  3.375 g Intravenous 3 times per day  . simvastatin  20 mg Oral Daily  . [START ON 01/23/2015] vancomycin  1,500 mg Intravenous Q24H     Electrolyte/Renal Profile and Glucose Profile:   Recent Labs Lab 01/21/15 1700 01/22/15 0452  NA 138 138  K 4.2 4.0  CL 104 109  CO2 21* 22  BUN 23* 19  CREATININE 0.94 0.87  CALCIUM 8.6* 7.9*  GLUCOSE 159* 128*   Protein Profile: No results for input(s): ALBUMIN in the last 168 hours.  Gastrointestinal Profile: Last BM:   Nutrition-Focused Physical Exam Findings:  Unable to complete Nutrition-Focused physical exam at this time.    Weight Change: Pt reports weight recently of 160lbs, current measured weight of 161lbs. Pt's son reports pt lost around 10lbs in one month possibly, potentially 6% in one month   Height:   Ht Readings from Last 1 Encounters:  01/21/15 5\' 6"  (1.676 m)    Weight:   Wt Readings from Last 1 Encounters:  01/21/15 161 lb 12.8 oz (73.392 kg)     BMI:  Body mass index is 26.13 kg/(m^2).  Estimated Nutritional Needs:   Kcal:  BEE: 1302kcals, TEE: (IF 1.1-1.3)(AF 1.2) 1720-2023kcals  Protein:  73-88g protein (1.0-1.2g/kg)  Fluid:  1825-2142mL of fluid (25-61mL/kg)  EDUCATION NEEDS:   No education needs identified at this time    [place], RD, LDN Pager (  [phone] Weekend/On-Call Pager [phone]

## 2015-01-22 NOTE — Progress Notes (Signed)
[place] [facility] [place] NAME: Spencer [name]    MR#:  [id number]  DATE OF BIRTH:  08/31/1917  SUBJECTIVE:   Patient is hard of hearing   REVIEW OF SYSTEMS:    Review of Systems  Constitutional: Negative for fever, chills and malaise/fatigue.  HENT: Negative for sore throat.   Eyes: Negative for blurred vision.  Respiratory: Negative for cough, hemoptysis, shortness of breath and wheezing.   Cardiovascular: Negative for chest pain, palpitations and leg swelling.  Gastrointestinal: Negative for nausea, vomiting, abdominal pain, diarrhea and blood in stool.  Genitourinary: Negative for dysuria.  Musculoskeletal: Negative for back pain.  Skin:       Right leg cellulitis with gangrenous changes   Neurological: Negative for dizziness, tremors and headaches.  Endo/Heme/Allergies: Does not bruise/bleed easily.    Tolerating Diet:yes      DRUG ALLERGIES:  No Known Allergies  VITALS:  Blood pressure 144/68, pulse 80, temperature 99.1 F (37.3 C), temperature source Oral, resp. rate 18, height 5\' 6"  (1.676 m), weight 73.392 kg (161 lb 12.8 oz), SpO2 93 %.  PHYSICAL EXAMINATION:   Physical Exam    LABORATORY PANEL:   CBC  Recent Labs Lab 01/22/15 0452  WBC 22.9*  HGB 9.6*  HCT 31.4*  PLT 299   ------------------------------------------------------------------------------------------------------------------  Chemistries   Recent Labs Lab 01/22/15 0452  NA 138  K 4.0  CL 109  CO2 22  GLUCOSE 128*  BUN 19  CREATININE 0.87  CALCIUM 7.9*   ------------------------------------------------------------------------------------------------------------------  Cardiac Enzymes No results for input(s): TROPONINI in the last 168 hours. ------------------------------------------------------------------------------------------------------------------  RADIOLOGY:  Dg Chest Port 1 View  01/21/2015  CLINICAL DATA:   Chest pain. Fever. Recent fall. Diabetes and hypertension. EXAM: PORTABLE CHEST 1 VIEW COMPARISON:  None. FINDINGS: The heart size and mediastinal contours are within normal limits. Both lungs are clear. No evidence of pneumothorax or pleural effusion. IMPRESSION: No active disease. Electronically Signed   By: Earle Gell M.D.   On: 01/21/2015 21:14   Dg Foot Complete Right  01/21/2015  CLINICAL DATA:  Diabetic foot ulcers with pain, swelling and redness. EXAM: RIGHT FOOT COMPLETE - 3+ VIEW COMPARISON:  None. FINDINGS: Diffuse soft tissue swelling/edema involving the foot and ankle. Extensive vascular calcifications. There are moderate degenerative changes but no definite destructive bony changes to suggest osteomyelitis. Pes cavus noted. IMPRESSION: Degenerative changes but no definite plain film findings for osteomyelitis. Diffuse soft tissue swelling/edema. Electronically Signed   By: Marijo Sanes M.D.   On: 01/21/2015 18:45     ASSESSMENT AND PLAN:   82 year old male with past medical history of diabetes, hypertension, diabetic neuropathy, hyperlipidemia, history of arthritis of presents to the hospital due to falls and also noted to have a right foot cellulitis and diabetic foot ulcer.  1 Right foot cellulitis with diabetic foot ulcer: This is the cause of patient's redness swelling on the foot. Continue IV antibiotics with vancomycin and Zosyn. Follow blood cultures. Patient's x-rays negative for any evidence of osteomyelitis. Podiatry consult pending  2 Sepsis, present on admission:patient presented with tachycardia and leukocytosis on admission Continue IV antibiotics as mentioned above. Follow blood cultures.  3 leukocytosis: This is secondary to cellulitis with diabetic foot ulcer. White blood cell count has improved with antibiotics. Continue current treatment.  4 diabetes type 2 without complication: Continue sliding scale insulin and ADA diet. Metformin is on hold.  5 Depression  - cont. Cymbalta.   6 Hyperlipidemia -  cont. Simvastatin.        Management plans discussed with the patient and he is in agreement.  CODE STATUS: FULL  TOTAL TIME TAKING CARE OF THIS PATIENT: 35 minutes.     POSSIBLE D/C 4 days, DEPENDING ON CLINICAL CONDITION.   Eulis Salazar M.D on 01/22/2015 at 11:22 AM  Between 7am to 6pm - Pager - [phone] After 6pm go to [email or web address] - password [email or web address] [place] [facility]  Office  [phone]  CC: Primary care physician; [name], MD  Note: This dictation was prepared with Dragon dictation along with smaller phrase technology. Any transcriptional errors that result from this process are unintentional.

## 2015-01-23 ENCOUNTER — Inpatient Hospital Stay: Payer: Medicare Other

## 2015-01-23 ENCOUNTER — Encounter: Payer: Self-pay | Admitting: *Deleted

## 2015-01-23 ENCOUNTER — Inpatient Hospital Stay: Payer: Medicare Other | Admitting: Anesthesiology

## 2015-01-23 ENCOUNTER — Encounter
Admission: EM | Disposition: A | Discharge status: Skilled Nursing Facility | Payer: Self-pay | Source: Home / Self Care | Attending: Internal Medicine

## 2015-01-23 HISTORY — PX: IRRIGATION AND DEBRIDEMENT FOOT: SHX6602

## 2015-01-23 LAB — GLUCOSE, CAPILLARY
GLUCOSE-CAPILLARY: 115 mg/dL — AB (ref 65–99)
GLUCOSE-CAPILLARY: 201 mg/dL — AB (ref 65–99)
Glucose-Capillary: 115 mg/dL — ABNORMAL HIGH (ref 65–99)
Glucose-Capillary: 124 mg/dL — ABNORMAL HIGH (ref 65–99)
Glucose-Capillary: 173 mg/dL — ABNORMAL HIGH (ref 65–99)

## 2015-01-23 LAB — CREATININE, SERUM
CREATININE: 1.08 mg/dL (ref 0.61–1.24)
GFR calc Af Amer: >60 mL/min (ref >60)
GFR calc non Af Amer: 56 mL/min — ABNORMAL LOW (ref >60)

## 2015-01-23 LAB — PHOSPHORUS: PHOSPHORUS: 2.3 mg/dL — AB (ref 2.5–4.6)

## 2015-01-23 SURGERY — IRRIGATION AND DEBRIDEMENT FOOT
Anesthesia: General | Laterality: Right | Wound class: Dirty or Infected

## 2015-01-23 MED ORDER — LIDOCAINE-EPINEPHRINE 1 %-1:100000 IJ SOLN
INTRAMUSCULAR | Status: DC | PRN
Start: 2015-01-23 — End: 2015-01-23
  Administered 2015-01-23: 5 mL

## 2015-01-23 MED ORDER — ONDANSETRON HCL 4 MG/2ML IJ SOLN: 4.0000 mg | Freq: Once | INTRAMUSCULAR | Status: DC | PRN

## 2015-01-23 MED ORDER — LACTATED RINGERS IV SOLN
INTRAVENOUS | Status: DC | PRN
Start: 2015-01-23 — End: 2015-01-23
  Administered 2015-01-23: 10:00:00 via INTRAVENOUS

## 2015-01-23 MED ORDER — LIDOCAINE HCL 1 % IJ SOLN
INTRAMUSCULAR | Status: DC | PRN
  Administered 2015-01-23: 5 mL

## 2015-01-23 MED ORDER — LIDOCAINE-EPINEPHRINE 1 %-1:100000 IJ SOLN
INTRAMUSCULAR | Status: AC
  Filled 2015-01-23: qty 1

## 2015-01-23 MED ORDER — PROPOFOL 500 MG/50ML IV EMUL
INTRAVENOUS | Status: DC | PRN
  Administered 2015-01-23: 10 ug/kg/min via INTRAVENOUS

## 2015-01-23 MED ORDER — LIDOCAINE HCL (CARDIAC) 20 MG/ML IV SOLN
INTRAVENOUS | Status: DC | PRN
  Administered 2015-01-23: 20 mg via INTRAVENOUS

## 2015-01-23 MED ORDER — VANCOMYCIN HCL IN DEXTROSE 750-5 MG/150ML-% IV SOLN: 750.0000 mg | INTRAVENOUS | Status: DC

## 2015-01-23 MED ORDER — MORPHINE SULFATE (PF) 2 MG/ML IV SOLN
1.0000 mg | INTRAVENOUS | Status: DC | PRN
  Administered 2015-01-27: 1 mg via INTRAVENOUS
  Filled 2015-01-23 (×2): qty 1

## 2015-01-23 MED ORDER — FENTANYL CITRATE (PF) 100 MCG/2ML IJ SOLN
INTRAMUSCULAR | Status: DC | PRN
  Administered 2015-01-23: 25 ug via INTRAVENOUS
  Administered 2015-01-23: 50 ug via INTRAVENOUS

## 2015-01-23 MED ORDER — BUPIVACAINE HCL (PF) 0.5 % IJ SOLN
INTRAMUSCULAR | Status: AC
  Filled 2015-01-23: qty 30

## 2015-01-23 MED ORDER — FENTANYL CITRATE (PF) 100 MCG/2ML IJ SOLN: 25.0000 ug | INTRAMUSCULAR | Status: DC | PRN

## 2015-01-23 MED ORDER — BUPIVACAINE HCL 0.5 % IJ SOLN
INTRAMUSCULAR | Status: DC | PRN
  Administered 2015-01-23: 12 mL

## 2015-01-23 MED ORDER — HYDROCODONE-ACETAMINOPHEN 5-325 MG PO TABS
1.0000 | ORAL_TABLET | Freq: Four times a day (QID) | ORAL | Status: DC | PRN
  Administered 2015-01-23 – 2015-01-31 (×8): 1 via ORAL
  Filled 2015-01-23 (×8): qty 1

## 2015-01-23 MED ORDER — LIDOCAINE HCL (PF) 1 % IJ SOLN
INTRAMUSCULAR | Status: AC
  Filled 2015-01-23: qty 30

## 2015-01-23 SURGICAL SUPPLY — 54 items
BANDAGE ACE 4X5 VEL STRL LF (GAUZE/BANDAGES/DRESSINGS) ×2 IMPLANT
BANDAGE STRETCH 3X4.1 STRL (GAUZE/BANDAGES/DRESSINGS) ×2 IMPLANT
BLADE OSC/SAGITTAL MD 5.5X18 (BLADE) IMPLANT
BLADE OSCILLATING/SAGITTAL (BLADE)
BLADE SW THK.38XMED LNG THN (BLADE) IMPLANT
BNDG COHESIVE 4X5 TAN STRL (GAUZE/BANDAGES/DRESSINGS) ×2 IMPLANT
BNDG COHESIVE 6X5 TAN STRL LF (GAUZE/BANDAGES/DRESSINGS) ×2 IMPLANT
BNDG ESMARK 4X12 TAN STRL LF (GAUZE/BANDAGES/DRESSINGS) IMPLANT
BNDG GAUZE 4.5X4.1 6PLY STRL (MISCELLANEOUS) ×2 IMPLANT
CANISTER SUCT 1200ML W/VALVE (MISCELLANEOUS) ×2 IMPLANT
CANISTER SUCT 3000ML (MISCELLANEOUS) ×2 IMPLANT
CUFF TOURN 18 STER (MISCELLANEOUS) IMPLANT
CUFF TOURN DUAL PL 12 NO SLV (MISCELLANEOUS) IMPLANT
DRAPE FLUOR MINI C-ARM 54X84 (DRAPES) IMPLANT
DRAPE XRAY CASSETTE 23X24 (DRAPES) ×2 IMPLANT
DRESSING ALLEVYN 4X4 (MISCELLANEOUS) ×2 IMPLANT
DURAPREP 26ML APPLICATOR (WOUND CARE) ×2 IMPLANT
GAUZE PACKING 1/4X5YD (GAUZE/BANDAGES/DRESSINGS) IMPLANT
GAUZE PACKING IODOFORM 1X5 (MISCELLANEOUS) IMPLANT
GAUZE PETRO XEROFOAM 1X8 (MISCELLANEOUS) ×2 IMPLANT
GAUZE SPONGE 4X4 12PLY STRL (GAUZE/BANDAGES/DRESSINGS) ×2 IMPLANT
GAUZE STRETCH 2X75IN STRL (MISCELLANEOUS) ×2 IMPLANT
GLOVE BIO SURGEON STRL SZ7.5 (GLOVE) ×2 IMPLANT
GLOVE INDICATOR 8.0 STRL GRN (GLOVE) ×2 IMPLANT
GOWN STRL REUS W/TWL MED LVL3 (GOWN DISPOSABLE) ×4 IMPLANT
HANDPIECE SUCTION TUBG SURGILV (MISCELLANEOUS) IMPLANT
HANDPIECE VERSAJET DEBRIDEMENT (MISCELLANEOUS) ×2 IMPLANT
IV NS 1000ML (IV SOLUTION) ×1
IV NS 1000ML BAXH (IV SOLUTION) ×1 IMPLANT
KIT RM TURNOVER STRD PROC AR (KITS) ×2 IMPLANT
LABEL OR SOLS (LABEL) ×2 IMPLANT
NEEDLE FILTER BLUNT 18X 1/2SAF (NEEDLE) ×1
NEEDLE FILTER BLUNT 18X1 1/2 (NEEDLE) ×1 IMPLANT
NEEDLE HYPO 25X1 1.5 SAFETY (NEEDLE) ×2 IMPLANT
NS IRRIG 500ML POUR BTL (IV SOLUTION) ×2 IMPLANT
PACK EXTREMITY ARMC (MISCELLANEOUS) ×2 IMPLANT
PAD ABD DERMACEA PRESS 5X9 (GAUZE/BANDAGES/DRESSINGS) ×2 IMPLANT
PAD GROUND ADULT SPLIT (MISCELLANEOUS) ×2 IMPLANT
RASP SM TEAR CROSS CUT (RASP) IMPLANT
SOL .9 NS 3000ML IRR  AL (IV SOLUTION) ×1
SOL .9 NS 3000ML IRR UROMATIC (IV SOLUTION) ×1 IMPLANT
SOL PREP PVP 2OZ (MISCELLANEOUS) ×2
SOLUTION PREP PVP 2OZ (MISCELLANEOUS) ×1 IMPLANT
STOCKINETTE IMPERVIOUS 9X36 MD (GAUZE/BANDAGES/DRESSINGS) ×2 IMPLANT
SUT ETHILON 2 0 FS 18 (SUTURE) IMPLANT
SUT ETHILON 4-0 (SUTURE) ×1
SUT ETHILON 4-0 FS2 18XMFL BLK (SUTURE) ×1
SUT VIC AB 3-0 SH 27 (SUTURE)
SUT VIC AB 3-0 SH 27X BRD (SUTURE) IMPLANT
SUT VIC AB 4-0 FS2 27 (SUTURE) IMPLANT
SUTURE ETHLN 4-0 FS2 18XMF BLK (SUTURE) ×1 IMPLANT
SWAB CULTURE AMIES ANAERIB BLU (MISCELLANEOUS) IMPLANT
SYR 3ML LL SCALE MARK (SYRINGE) IMPLANT
SYRINGE 10CC LL (SYRINGE) ×2 IMPLANT

## 2015-01-23 NOTE — Op Note (Signed)
Operative note   Surgeon:Ghali Morissette Lawyer: None    Preop diagnosis: Abscess right foot    Postop diagnosis: Same    Procedure: Incision and drainage abscess right foot    EBL: Minimal    Anesthesia:local and IV sedation    Hemostasis: Epinephrine infiltrated along incision site    Specimen: Wound culture    Complications: None    Operative indications: 82 year old gentleman with an abscess noted to his right great toe joint with marked purulent drainage and cellulitis. Presents today for surgery. The risks benefits alternatives and, indications were discussed with the patient and family and consent has been given    Procedure:  Prior to starting the case in the OR a Doppler was used to evaluate his pulses to his lower extremity. There were monophasic dorsalis pedis and posterior tibial pulses on the right lower term.  Patient was brought into the OR and placed on the operating table in thesupine position. After anesthesia was obtained theright lower extremity was prepped and draped in usual sterile fashion.  Attention was directed to the plantar medial aspect of the right first MTPJ where the abscessed region was noted. An area of superficial skin necrosis was noted inferior to this site was a change from last evening to this morning. The wound was probed and an area distal and lateral to the wound and proximal into the arch was noted to have purulent drainage. Incision was made down to the fascial layer both plantar as well as into the medial arch. The long flexor tendon and plantar fascia was noted. Further dissection was carried into the central and proximal arch region on the right medial foot.. Purulent drainage was noted along this entire incision site. A wound culture was performed. At This time the wound was flushed and excisional debridement was performed with versa jet down to the fascial layer and tendon region. No further infection was noted. The plantar first MTPJ  capsule was intact and no exposure of bone was noted. The wound was packed with iodoform packing and closure was performed with areas left open at the proximal central and distal aspect of the incision site.    Patient tolerated the procedure and anesthesia well.  Was transported from the OR to the PACU with all vital signs stable. Will be readmitted to the floor.

## 2015-01-23 NOTE — Anesthesia Preprocedure Evaluation (Signed)
Anesthesia Evaluation  Patient identified by MRN, date of birth, ID band Patient awake    Reviewed: Allergy & Precautions, H&P , NPO status , Patient's Chart, lab work & pertinent test results, reviewed documented beta blocker date and time   Airway Mallampati: II  TM Distance: >3 FB Neck ROM: full    Dental no notable dental hx.    Pulmonary neg pulmonary ROS, former smoker,    Pulmonary exam normal breath sounds clear to auscultation       Cardiovascular Exercise Tolerance: Good hypertension, negative cardio ROS   Rhythm:regular Rate:Normal     Neuro/Psych negative neurological ROS  negative psych ROS   GI/Hepatic negative GI ROS, Neg liver ROS,   Endo/Other  negative endocrine ROSdiabetes  Renal/GU negative Renal ROS  negative genitourinary   Musculoskeletal   Abdominal   Peds  Hematology negative hematology ROS (+)   Anesthesia Other Findings   Reproductive/Obstetrics negative OB ROS                             Anesthesia Physical Anesthesia Plan  ASA: III  Anesthesia Plan: MAC   Post-op Pain Management:    Induction:   Airway Management Planned:   Additional Equipment:   Intra-op Plan:   Post-operative Plan:   Informed Consent: I have reviewed the patients History and Physical, chart, labs and discussed the procedure including the risks, benefits and alternatives for the proposed anesthesia with the patient or authorized representative who has indicated his/her understanding and acceptance.   Dental Advisory Given  Plan Discussed with: CRNA  Anesthesia Plan Comments:         Anesthesia Quick Evaluation

## 2015-01-23 NOTE — Progress Notes (Signed)
Called [facility] to schedule PICC line, this patient is 3rd in line for PICC placement today.

## 2015-01-23 NOTE — Progress Notes (Signed)
Patient returned from Irrigation and Debridement done by Dr. Vickki Muff around lunchtime.  Patient speech was slightly slurred in recovery and following and CT was ordered.  Speech has improved and patient is much more alert in comparison to this morning.  Family at bedside.

## 2015-01-23 NOTE — Progress Notes (Signed)
Called Dr. Benjie Karvonen to advise cardiac monitoring showed questionable a-fib or wondering atrial pacer.  Patient EKG on 01/21/2015 did not show afib, unsure if new onset.

## 2015-01-23 NOTE — Plan of Care (Signed)
Problem: Skin Integrity: Goal: Risk for impaired skin integrity will decrease Outcome: Progressing Has Irrigation and Debridement today.

## 2015-01-23 NOTE — Transfer of Care (Signed)
Immediate Anesthesia Transfer of Care Note  Patient: Spencer [name]  Procedure(s) Performed: Procedure(s): IRRIGATION AND DEBRIDEMENT FOOT (Right)  Patient Location: PACU  Anesthesia Type:General  Level of Consciousness: awake, alert  and oriented  Airway & Oxygen Therapy: Patient Spontanous Breathing and Patient connected to nasal cannula oxygen  Post-op Assessment: Report given to RN and Post -op Vital signs reviewed and stable  Post vital signs: Reviewed and stable  Last Vitals:  Filed Vitals:   01/23/15 0929 01/23/15 1054  BP: 140/72 130/60  Pulse: 73 82  Temp: 37.3 C 37 C  Resp: 16 20    Complications: No apparent anesthesia complications

## 2015-01-23 NOTE — Consult Note (Signed)
[place]  CARDIOLOGY CONSULT NOTE  Patient ID: Spencer [name] MRN: [id number] DOB/AGE: [birth year] 82 y.o.  Admit date: 01/21/2015 Referring Physician Dr. Benjie Karvonen Primary Physician Dr. Kary Kos Primary Cardiologist Dr. Saralyn Pilar Reason for Consultation afib  HPI: Pt is a 82 yo male with history of moderate aortic stenosis with ava  1.1 and mean gradient 32.3.  Who also has dm, htn and hyperlipidemia. He was admitted after debridement of a diaeteic foot ulcer today and noted to have irregular heart rate c/w afib. Admission ekg showed similar findings. Pt is a difficult historian but his family does not recall any history of afib. He had some slurred speech today and head ct was negative for acute cva. Pt is somewhat unsteady on his feet and has some falls but has not hit his head recently. His chads 2 vasc score is at least 6. Poor peripheral circulation with vascular surgery consult pending.   ROS Review of Systems - History obtained from child and chart review General ROS: positive for  - foot pain Respiratory ROS: positive for - shortness of breath Cardiovascular ROS: positive for - irregular heartbeat Gastrointestinal ROS: no abdominal pain, change in bowel habits, or black or bloody stools Neurological ROS: positive for - speech problems   Past Medical History  Diagnosis Date  . Diabetes mellitus without complication ([place])   . Hypertension   . Arthritis   . High cholesterol     Family History  Problem Relation Age of Onset  . Prostate cancer Father     Social History   Social History  . Marital Status: Widowed    Spouse Name: N/A  . Number of Children: N/A  . Years of Education: N/A   Occupational History  . Not on file.   Social History Main Topics  . Smoking status: Former Research scientist (life sciences)  . Smokeless tobacco: Never Used  . Alcohol Use: No  . Drug Use: No  . Sexual Activity: Not on file   Other Topics Concern  . Not on file    Social History Narrative    Past Surgical History  Procedure Laterality Date  . Appendectomy    . Irrigation and debridement foot Right 01/23/2015    Procedure: IRRIGATION AND DEBRIDEMENT FOOT;  Surgeon: Samara Deist, DPM;  Location: [facility] ORS;  Service: Podiatry;  Laterality: Right;     Prescriptions prior to admission  Medication Sig Dispense Refill Last Dose  . aspirin EC 81 MG tablet Take 81 mg by mouth daily.   unknown at unknown  . celecoxib (CELEBREX) 200 MG capsule Take 200 mg by mouth daily as needed for mild pain.   PRN at PRN  . docusate sodium (COLACE) 100 MG capsule Take 100 mg by mouth daily.   unknown at unknown  . DULoxetine (CYMBALTA) 20 MG capsule Take 20 mg by mouth daily.   unknown at unknown  . metFORMIN (GLUCOPHAGE-XR) 500 MG 24 hr tablet Take 500 mg by mouth daily.   unknown at unknown  . simvastatin (ZOCOR) 20 MG tablet Take 20 mg by mouth daily.   unknown at unknown  . furosemide (LASIX) 40 MG tablet Take 40 mg by mouth daily. Reported on 01/23/2015   Not Taking at unknown     Physical Exam: Blood pressure 142/53, pulse 78, temperature 98.4 F (36.9 C), temperature source Oral, resp. rate 18, height 5\' 6"  (1.676 m), weight 73.029 kg (161 lb), SpO2 91 %.    General appearance: cooperative Resp: clear to  auscultation bilaterally Cardio: irregularly irregular rhythm GI: soft, non-tender; bowel sounds normal; no masses,  no organomegaly Extremities: s/p debridement Pulses: poorly palpable pulses in le Neurologic: Grossly normal Incision/Wound: Labs:   Lab Results  Component Value Date   WBC 22.9* 01/22/2015   HGB 9.6* 01/22/2015   HCT 31.4* 01/22/2015   MCV 73.2* 01/22/2015   PLT 299 01/22/2015    Recent Labs Lab 01/22/15 0452 01/23/15 0523  NA 138  --   K 4.0  --   CL 109  --   CO2 22  --   BUN 19  --   CREATININE 0.87 1.08  CALCIUM 7.9*  --   GLUCOSE 128*  --    No results found for: CKTOTAL, CKMB, CKMBINDEX, TROPONINI    Radiology:  Head ct negative for acute cva, mri pending.  EKG: Probable afib with controlled vr.  ASSESSMENT AND PLAN:  82 yo male with moderate aortic stenosis, dm, htn and pvd s/p debridement of diabetic foot ulcer noted to have afib with controlled vr. Not previously described. CHADS2VASc score is at least 6 for age, htn, tia,dm and pvd. Discussed risk and benefits of chronic anticoagulation with patient and family member. No absolute contraindications for anticoagulation. Would start eliquis a 5 mg bid. Pt is greataer than 82 but has normal renal function and weight greater than 60 kg. Discussed with dr. Vickki Muff. OK to start from his standpoint but will await vascular surgery evaluation prior to starting in case an invasive procedure is required. Rate is controlled at present.  Signed: Teodoro Spray MD, [facility] 01/23/2015, 4:46 PM

## 2015-01-23 NOTE — Progress Notes (Signed)
Patient prepped and left for surgery.  Bilateral hearing aids left in place so that patient could hear in preop.  Glasses remained in room.  No family present when left for surgery.  Same day surgery and OR notified of low grade temp, patient npo and has not met criteria for tylenol as of yet.  They will advise Dr. Vickki Muff.

## 2015-01-23 NOTE — Care Management Important Message (Signed)
Important Message  Patient Details  Name: Spencer [name] MRN: [id number] Date of Birth: Mar 21, 1917   Medicare Important Message Given:  Yes    [name] 01/23/2015, 8:57 AM

## 2015-01-23 NOTE — Progress Notes (Signed)
[facility] [place] NAME: Spencer [name]    MR#:  [id number]  DATE OF BIRTH:  06-30-1917  SUBJECTIVE:  Patient had bedside debridement  plan is for the patient to go to or today for further debridement.  REVIEW OF SYSTEMS:    Review of Systems  Constitutional: Negative for fever, chills and malaise/fatigue.  HENT: Negative for sore throat.   Eyes: Negative for blurred vision.  Respiratory: Negative for cough, hemoptysis, shortness of breath and wheezing.   Cardiovascular: Negative for chest pain, palpitations and leg swelling.  Gastrointestinal: Negative for nausea, vomiting, abdominal pain, diarrhea and blood in stool.  Genitourinary: Negative for dysuria.  Musculoskeletal: Negative for back pain.  Skin:       RLE cellulitis wrapped  Neurological: Negative for dizziness, tremors and headaches.  Endo/Heme/Allergies: Does not bruise/bleed easily.    Tolerating Diet:yes      DRUG ALLERGIES:  No Known Allergies  VITALS:  Blood pressure 130/60, pulse 82, temperature 98.6 F (37 C), temperature source Axillary, resp. rate 20, height 5\' 6"  (1.676 m), weight 73.029 kg (161 lb), SpO2 97 %.  PHYSICAL EXAMINATION:   Physical Exam  Constitutional: He is oriented to person, place, and time and well-developed, well-nourished, and in no distress. No distress.  HENT:  Head: Normocephalic.  Eyes: No scleral icterus.  Neck: Normal range of motion. Neck supple. No JVD present. No tracheal deviation present.  Cardiovascular: Normal rate, regular rhythm and normal heart sounds.  Exam reveals no gallop and no friction rub.   No murmur heard. Pulmonary/Chest: Effort normal and breath sounds normal. No respiratory distress. He has no wheezes. He has no rales. He exhibits no tenderness.  Abdominal: Soft. Bowel sounds are normal. He exhibits no distension and no mass. There is no tenderness. There is no rebound and no guarding.   Musculoskeletal: Normal range of motion. He exhibits no edema.  Neurological: He is alert and oriented to person, place, and time.  Skin: Skin is warm. No rash noted. No erythema.  His foot is wrapped  Psychiatric: Affect and judgment normal.      LABORATORY PANEL:   CBC  Recent Labs Lab 01/22/15 0452  WBC 22.9*  HGB 9.6*  HCT 31.4*  PLT 299   ------------------------------------------------------------------------------------------------------------------  Chemistries   Recent Labs Lab 01/22/15 0452 01/23/15 0523  NA 138  --   K 4.0  --   CL 109  --   CO2 22  --   GLUCOSE 128*  --   BUN 19  --   CREATININE 0.87 1.08  CALCIUM 7.9*  --    ------------------------------------------------------------------------------------------------------------------  Cardiac Enzymes No results for input(s): TROPONINI in the last 168 hours. ------------------------------------------------------------------------------------------------------------------  RADIOLOGY:  Dg Chest Port 1 View  01/21/2015  CLINICAL DATA:  Chest pain. Fever. Recent fall. Diabetes and hypertension. EXAM: PORTABLE CHEST 1 VIEW COMPARISON:  None. FINDINGS: The heart size and mediastinal contours are within normal limits. Both lungs are clear. No evidence of pneumothorax or pleural effusion. IMPRESSION: No active disease. Electronically Signed   By: Earle Gell M.D.   On: 01/21/2015 21:14   Dg Foot Complete Right  01/21/2015  CLINICAL DATA:  Diabetic foot ulcers with pain, swelling and redness. EXAM: RIGHT FOOT COMPLETE - 3+ VIEW COMPARISON:  None. FINDINGS: Diffuse soft tissue swelling/edema involving the foot and ankle. Extensive vascular calcifications. There are moderate degenerative changes but no definite destructive bony changes to suggest osteomyelitis. Pes cavus noted. IMPRESSION:  Degenerative changes but no definite plain film findings for osteomyelitis. Diffuse soft tissue swelling/edema.  Electronically Signed   By: Marijo Sanes M.D.   On: 01/21/2015 18:45     ASSESSMENT AND PLAN:   82 year old male with past medical history of diabetes, hypertension, diabetic neuropathy, hyperlipidemia, history of arthritis of presents to the hospital due to falls and also noted to have a right foot cellulitis and diabetic foot ulcer.  1 Right foot cellulitis with diabetic foot ulcer/abscess: This is the cause of patient's redness swelling on the foot. Continue IV antibiotics with vancomycin and Zosyn.  Blood cultures are negative today. Wound culture was taken yesterday during bedside I and D. Patient's x-rays negative for any evidence of osteomyelitis. Podiatry consult appreciated. Plan is for patient to go to the operating room for further debridement.  2 Sepsis, present on admission:patient presented with tachycardia and leukocytosis on admission Continue IV antibiotics as mentioned above. Follow up on blood cultures.  3 leukocytosis: This is secondary to cellulitis with diabetic foot ulcer. White blood cell count has improved with antibiotics. Continue current treatment.  4 diabetes type 2 without complication: Continue sliding scale insulin and ADA diet. Metformin is on hold.  5 Depression - cont. Cymbalta.   6 Hyperlipidemia - cont. Simvastatin.        Management plans discussed with the patient and he is in agreement.  CODE STATUS: FULL  TOTAL TIME TAKING CARE OF THIS PATIENT: 28 minutes.     POSSIBLE D/C 2-3 days, DEPENDING ON CLINICAL CONDITION.   Evona Westra M.D on 01/23/2015 at 10:58 AM  Between 7am to 6pm - Pager - [phone] After 6pm go to [email or web address] - password [email or web address] [facility]  Office  [phone]  CC: Primary care physician; [name], MD  Note: This dictation was prepared with Dragon dictation along with smaller phrase technology. Any transcriptional errors that result from this process are unintentional.

## 2015-01-23 NOTE — Anesthesia Procedure Notes (Signed)
Date/Time: 01/23/2015 10:02 AM Performed by: Johnna Acosta Pre-anesthesia Checklist: Patient identified, Emergency Drugs available, Suction available and Patient being monitored Patient Re-evaluated:Patient Re-evaluated prior to inductionOxygen Delivery Method: Nasal cannula

## 2015-01-23 NOTE — Consult Note (Signed)
[facility] Infectious Disease     Reason for Consult:Foot infection   Referring Physician: Bettey Costa Date of Admission:  01/21/2015   Active Problems:   Cellulitis  HPI: Spencer [name] is a 82 y.o. male with DM, PN admitted with redness swelling of R great toe.  Underwent debridement by Dr Vickki Muff. [place] pending.   Past Medical History  Diagnosis Date  . Diabetes mellitus without complication ([place])   . Hypertension   . Arthritis   . High cholesterol    Past Surgical History  Procedure Laterality Date  . Appendectomy    . Irrigation and debridement foot Right 01/23/2015    Procedure: IRRIGATION AND DEBRIDEMENT FOOT;  Surgeon: Samara Deist, DPM;  Location: [facility] ORS;  Service: Podiatry;  Laterality: Right;   Social History  Substance Use Topics  . Smoking status: Former Research scientist (life sciences)  . Smokeless tobacco: Never Used  . Alcohol Use: No   Family History  Problem Relation Age of Onset  . Prostate cancer Father     Allergies: No Known Allergies  Current antibiotics: Antibiotics Given (last 72 hours)    Date/Time Action Medication Dose Rate   01/22/15 0023 Given   vancomycin (VANCOCIN) 500 mg in sodium chloride 0.9 % 100 mL IVPB 500 mg 100 mL/hr   01/22/15 6213 Given   vancomycin (VANCOCIN) 1,500 mg in sodium chloride 0.9 % 500 mL IVPB 1,500 mg 250 mL/hr   01/22/15 0545 Given   piperacillin-tazobactam (ZOSYN) IVPB 3.375 g 3.375 g 12.5 mL/hr   01/22/15 1341 Given   piperacillin-tazobactam (ZOSYN) IVPB 3.375 g 3.375 g 12.5 mL/hr   01/22/15 2122 Given   piperacillin-tazobactam (ZOSYN) IVPB 3.375 g 3.375 g 12.5 mL/hr   01/23/15 0317 Given   vancomycin (VANCOCIN) 1,500 mg in sodium chloride 0.9 % 500 mL IVPB 1,500 mg 250 mL/hr   01/23/15 0518 Given   piperacillin-tazobactam (ZOSYN) IVPB 3.375 g 3.375 g 12.5 mL/hr   01/23/15 1427 Given   piperacillin-tazobactam (ZOSYN) IVPB 3.375 g 3.375 g 12.5 mL/hr      MEDICATIONS: . aspirin EC  81 mg Oral Daily  . docusate sodium   100 mg Oral Daily  . DULoxetine  20 mg Oral Daily  . enoxaparin (LOVENOX) injection  40 mg Subcutaneous Q24H  . feeding supplement (ENSURE ENLIVE)  237 mL Oral TID WC  . furosemide  40 mg Oral Daily  . insulin aspart  0-5 Units Subcutaneous QHS  . insulin aspart  0-9 Units Subcutaneous TID WC  . piperacillin-tazobactam (ZOSYN)  IV  3.375 g Intravenous 3 times per day  . simvastatin  20 mg Oral Daily  . [START ON 01/24/2015] vancomycin  750 mg Intravenous Q24H    Review of Systems - 11 systems reviewed and negative per HPI   OBJECTIVE: Temp:  [97.4 F (36.3 C)-99.9 F (37.7 C)] 98.6 F (37 C) (01/13 1932) Pulse Rate:  [64-87] 79 (01/13 1932) Resp:  [16-20] 20 (01/13 1932) BP: (126-156)/(53-72) 139/56 mmHg (01/13 1932) SpO2:  [90 %-98 %] 90 % (01/13 1932) Weight:  [73.029 kg (161 lb)] 73.029 kg (161 lb) (01/13 0929) Physical Exam  Constitutional: He is oriented to person, place, and time. Frail HENT: perrla  Mouth/Throat: Oropharynx is clear and dry. No oropharyngeal exudate.  Cardiovascular: Normal rate, 3/6 sm  Pulmonary/Chest: bil rhonchi  Abdominal: Soft. Bowel sounds are normal. He exhibits no distension. There is no tenderness.  Lymphadenopathy:  He has no cervical adenopathy.  Neurological: He is alert and oriented to person, place, and  time.  Skin: Skin is warm and dry. No rash noted. No erythema.  Psychiatric: He has a normal mood and affect. His behavior is normal.  Ext r foot wrapped  LABS: Results for orders placed or performed during the hospital encounter of 01/21/15 (from the past 48 hour(s))  Basic metabolic panel     Status: Abnormal   Collection Time: 01/22/15  4:52 AM  Result Value Ref Range   Sodium 138 135 - 145 mmol/L   Potassium 4.0 3.5 - 5.1 mmol/L   Chloride 109 101 - 111 mmol/L   CO2 22 22 - 32 mmol/L   Glucose, Bld 128 (H) 65 - 99 mg/dL   BUN 19 6 - 20 mg/dL   Creatinine, Ser 0.87 0.61 - 1.24 mg/dL   Calcium 7.9 (L) 8.9 - 10.3 mg/dL   GFR  calc non Af Amer >60 >60 mL/min   GFR calc Af Amer >60 >60 mL/min    Comment: (NOTE) The eGFR has been calculated using the CKD EPI equation. This calculation has not been validated in all clinical situations. eGFR's persistently <60 mL/min signify possible Chronic Kidney Disease.    Anion gap 7 5 - 15  CBC     Status: Abnormal   Collection Time: 01/22/15  4:52 AM  Result Value Ref Range   WBC 22.9 (H) 3.8 - 10.6 K/uL   RBC 4.28 (L) 4.40 - 5.90 MIL/uL   Hemoglobin 9.6 (L) 13.0 - 18.0 g/dL   HCT 31.4 (L) 40.0 - 52.0 %   MCV 73.2 (L) 80.0 - 100.0 fL   MCH 22.5 (L) 26.0 - 34.0 pg   MCHC 30.7 (L) 32.0 - 36.0 g/dL   RDW 17.6 (H) 11.5 - 14.5 %   Platelets 299 150 - 440 K/uL  Glucose, capillary     Status: Abnormal   Collection Time: 01/22/15  8:30 AM  Result Value Ref Range   Glucose-Capillary 132 (H) 65 - 99 mg/dL   Comment 1 Notify RN   Glucose, capillary     Status: Abnormal   Collection Time: 01/22/15 11:11 AM  Result Value Ref Range   Glucose-Capillary 142 (H) 65 - 99 mg/dL   Comment 1 Notify RN   Glucose, capillary     Status: Abnormal   Collection Time: 01/22/15  3:30 PM  Result Value Ref Range   Glucose-Capillary 140 (H) 65 - 99 mg/dL   Comment 1 Notify RN   Wound culture     Status: None (Preliminary result)   Collection Time: 01/22/15  5:50 PM  Result Value Ref Range   Specimen Description ABSCESS    Special Requests Immunocompromised    Gram Stain RARE WBC SEEN FEW GRAM POSITIVE COCCI     Culture NO GROWTH < 24 HOURS    Report Status PENDING   Glucose, capillary     Status: Abnormal   Collection Time: 01/22/15  8:59 PM  Result Value Ref Range   Glucose-Capillary 154 (H) 65 - 99 mg/dL  Creatinine, serum     Status: Abnormal   Collection Time: 01/23/15  5:23 AM  Result Value Ref Range   Creatinine, Ser 1.08 0.61 - 1.24 mg/dL   GFR calc non Af Amer 56 (L) >60 mL/min   GFR calc Af Amer >60 >60 mL/min    Comment: (NOTE) The eGFR has been calculated using the  CKD EPI equation. This calculation has not been validated in all clinical situations. eGFR's persistently <60 mL/min signify possible Chronic Kidney Disease.  Glucose, capillary     Status: Abnormal   Collection Time: 01/23/15  7:35 AM  Result Value Ref Range   Glucose-Capillary 115 (H) 65 - 99 mg/dL   Comment 1 Notify RN   Glucose, capillary     Status: Abnormal   Collection Time: 01/23/15 11:42 AM  Result Value Ref Range   Glucose-Capillary 124 (H) 65 - 99 mg/dL  Glucose, capillary     Status: Abnormal   Collection Time: 01/23/15 12:21 PM  Result Value Ref Range   Glucose-Capillary 115 (H) 65 - 99 mg/dL   Comment 1 Notify RN   Phosphorus     Status: Abnormal   Collection Time: 01/23/15  4:20 PM  Result Value Ref Range   Phosphorus 2.3 (L) 2.5 - 4.6 mg/dL  Glucose, capillary     Status: Abnormal   Collection Time: 01/23/15  4:24 PM  Result Value Ref Range   Glucose-Capillary 201 (H) 65 - 99 mg/dL   Comment 1 Notify RN   Glucose, capillary     Status: Abnormal   Collection Time: 01/23/15  8:17 PM  Result Value Ref Range   Glucose-Capillary 173 (H) 65 - 99 mg/dL   Comment 1 Notify RN    No components found for: ESR, C REACTIVE PROTEIN MICRO: Recent Results (from the past 720 hour(s))  Culture, blood (routine x 2)     Status: None (Preliminary result)   Collection Time: 01/21/15  6:48 PM  Result Value Ref Range Status   Specimen Description BLOOD RIGHT WRIST  Final   Special Requests BAA,ANA,AER,6ML  Final   Culture NO GROWTH 2 DAYS  Final   Report Status PENDING  Incomplete  Culture, blood (routine x 2)     Status: None (Preliminary result)   Collection Time: 01/21/15  6:48 PM  Result Value Ref Range Status   Specimen Description BLOOD LEFT ARM  Final   Special Requests BAA,ANA,AER,7ML  Final   Culture NO GROWTH 2 DAYS  Final   Report Status PENDING  Incomplete  Wound culture     Status: None (Preliminary result)   Collection Time: 01/22/15  5:50 PM  Result Value  Ref Range Status   Specimen Description ABSCESS  Final   Special Requests Immunocompromised  Final   Gram Stain RARE WBC SEEN FEW GRAM POSITIVE COCCI   Final   Culture NO GROWTH < 24 HOURS  Final   Report Status PENDING  Incomplete    IMAGING: Ct Head Wo Contrast  01/23/2015  CLINICAL DATA:  Slurred speech and decreased cognitive function recently. EXAM: CT HEAD WITHOUT CONTRAST TECHNIQUE: Contiguous axial images were obtained from the base of the skull through the vertex without intravenous contrast. COMPARISON:  None currently available FINDINGS: Skull and Sinuses:Negative for fracture or destructive process. The visualized mastoids, middle ears, and imaged paranasal sinuses are clear. Visualized orbits: Left cataract resection. Brain: No evidence of acute infarction, hemorrhage, hydrocephalus, or swelling. No extra-axial collection. There is moderate generalized atrophy with commensurate ventriculomegaly. Hazy low-density in the cerebral white matter consistent with chronic small vessel disease, with superimposed small vessel infarcts in the inferior right occipital cortex and bifrontal white matter. Lobulated soft tissue density at the upper anterior falx measuring up to 16 mm (including the superior sagittal sinus). Neighboring calcification could be dural ossification or lesional. There is no brain contact. No associated bone changes. This usually reflects meningioma in a patient without malignancy history. IMPRESSION: 1. No acute finding. 2. Moderate atrophy and chronic small vessel  disease for age. 3. 13 mm mass at the anterior falx, usually reflecting meningioma. No associated mass effect. Electronically Signed   By: Monte Fantasia M.D.   On: 01/23/2015 12:19   Mr Brain Wo Contrast  01/23/2015  CLINICAL DATA:  Multiple falls, slurred speech, decreased cognitive function. EXAM: MRI HEAD WITHOUT CONTRAST TECHNIQUE: Multiplanar, multiecho pulse sequences of the brain and surrounding structures  were obtained without intravenous contrast. COMPARISON:  CT head earlier in the day. FINDINGS: No evidence for acute stroke, acute hemorrhage, or extra-axial fluid. Marked ventricular enlargement, which on first glance, has features suggestive of normal pressure hydrocephalus, with markedly dilated atria of the lateral ventricles, prominent temporal horns, and periventricular white matter signal abnormality. Given the cortical atrophy, other white matter lesions suggestive of chronic microvascular ischemic change, and chronic lacunar infarctions present, ventricular dilatation is favored to represent central atrophy and hydrocephalus ex vacuo rather than normal pressure hydrocephalus. No midline abnormality. Cervical spondylosis is suspected, with disc space narrowing and cord displacement at C4-C5. Flow voids are maintained in the carotid, basilar, and both vertebral arteries. Noncontrast examination was performed. An anterior falcine extra-axial mass, 14 x 14 mm cross-section is redemonstrated. The mass has invaded the superior sagittal sinus but otherwise appears non worrisome. No osseous erosion. This is consistent with an incidental meningioma. LEFT cataract extraction. No layering fluid in the sinuses. Unremarkable mastoids. Extracranial soft tissues unremarkable. IMPRESSION: Global atrophy with chronic microvascular ischemic change, moderately advanced. No acute intracranial findings. 14 x 14 mm extra-axial mass in the midline frontal falx. Despite the noncontrast exam, one can be reasonably certain that this represents a meningioma, an incidental finding. Suspected cervical spondylosis, incompletely evaluated on this motion degraded brain study. When the patient is better able to hold still, and if there are signs and symptoms of cervical spondylosis or myelopathy, consider MRI cervical spine for further evaluation on an elective basis. Electronically Signed   By: Staci Righter M.D.   On: 01/23/2015 19:15    Dg Chest Port 1 View  01/23/2015  CLINICAL DATA:  Central catheter placement EXAM: PORTABLE CHEST 1 VIEW COMPARISON:  Study obtained earlier in the day FINDINGS: Central catheter tip is in the superior vena cava. No pneumothorax. There is patchy airspace consolidation in each lung base. Lungs elsewhere clear. Heart is mildly enlarged with pulmonary vascular within normal limits. No adenopathy. There is degenerative change in each shoulder with superior migration of each humeral head. IMPRESSION: Central catheter tip in superior vena cava. No pneumothorax. Patchy infiltrate in the lung bases, likely multifocal pneumonia. Lungs otherwise clear. Stable cardiac prominence. Probable chronic rotator cuff tears bilaterally. Electronically Signed   By: Lowella Grip III M.D.   On: 01/23/2015 21:31   Dg Chest Port 1 View  01/23/2015  CLINICAL DATA:  PICC line placement.  Cellulitis. EXAM: PORTABLE CHEST 1 VIEW COMPARISON:  01/21/2015 FINDINGS: New right arm PICC line is seen with tip overlying the proximal right atrium. Low lung volumes are seen with mild bibasilar atelectasis versus developing infiltrates. No evidence of pleural effusion. Heart size is within normal limits. IMPRESSION: Right arm PICC line tip overlies the proximal right atrium. Low lung volumes with development of mild bibasilar atelectasis versus infiltrates. Electronically Signed   By: Earle Gell M.D.   On: 01/23/2015 21:12   Dg Chest Port 1 View  01/21/2015  CLINICAL DATA:  Chest pain. Fever. Recent fall. Diabetes and hypertension. EXAM: PORTABLE CHEST 1 VIEW COMPARISON:  None. FINDINGS: The heart size and  mediastinal contours are within normal limits. Both lungs are clear. No evidence of pneumothorax or pleural effusion. IMPRESSION: No active disease. Electronically Signed   By: Earle Gell M.D.   On: 01/21/2015 21:14   Dg Foot Complete Right  01/21/2015  CLINICAL DATA:  Diabetic foot ulcers with pain, swelling and redness. EXAM:  RIGHT FOOT COMPLETE - 3+ VIEW COMPARISON:  None. FINDINGS: Diffuse soft tissue swelling/edema involving the foot and ankle. Extensive vascular calcifications. There are moderate degenerative changes but no definite destructive bony changes to suggest osteomyelitis. Pes cavus noted. IMPRESSION: Degenerative changes but no definite plain film findings for osteomyelitis. Diffuse soft tissue swelling/edema. Electronically Signed   By: Marijo Sanes M.D.   On: 01/21/2015 18:45    Assessment:   Damere Brandenburg is a 82 y.o. male with  DM (A1c 6.9) associated with PN admitted with R foot infection no s/p debridement 1/13with extensive soft tissue infection but no exposed bone.  He will likely need 2-4 weeks IV therapy followed by oral tail  Recommendations Place picc - discussed with pt and family   Will tailor 2 -4 weeks IV abx based on culture results Continue vanco and zosyn for now.   Thank you very much for allowing me to participate in the care of this patient. Please call with questions.   Cheral Marker. Ola Spurr, MD

## 2015-01-23 NOTE — Progress Notes (Signed)
Discussed case with family. Family has noted a decreased cognitive function and possible slurring of speech recently.  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

## 2015-01-23 NOTE — Progress Notes (Signed)
Patient family concerned that patient is very lethargic and slurring while he speaks.  They mentioned that he was prescibed medication for nerve pain and they ruled one of them out because it was causing him to sleep too much, but they could not recall which one it was.    Called Dr. Kary Kos [phone] and spoke with Marisue Humble, RN.  Patient was last seen September 2016.   She advised that patient had refill date for Cymbalta 12/29/2014 but did not have any refills for Celebrex so she thinks he is only taking the Cymbalta.

## 2015-01-23 NOTE — Progress Notes (Addendum)
ANTIBIOTIC CONSULT NOTE - INITIAL  Pharmacy Consult for Vancomycin/Zosyn  Indication: cellulitis, sepsis  No Known Allergies  Patient Measurements: Height: 5\' 6"  (167.6 cm) Weight: 161 lb 12.8 oz (73.392 kg) IBW/kg (Calculated) : 63.8 Adjusted Body Weight: 84.7 kg   Vital Signs: Temp: 99.9 F (37.7 C) (01/13 0734) Temp Source: Axillary (01/13 0734) BP: 136/56 mmHg (01/13 0734) Pulse Rate: 78 (01/13 0734) Intake/Output from previous day: 01/12 0701 - 01/13 0700 In: 360 [P.O.:360] Out: 250 [Urine:250] Intake/Output from this shift:    Labs:  Recent Labs  01/21/15 1700 01/22/15 0452 01/23/15 0523  WBC 30.4* 22.9*  --   HGB 10.9* 9.6*  --   PLT 308 299  --   CREATININE 0.94 0.87 1.08   Estimated Creatinine Clearance: 36.9 mL/min (by C-G formula based on Cr of 1.08). No results for input(s): VANCOTROUGH, VANCOPEAK, VANCORANDOM, GENTTROUGH, GENTPEAK, GENTRANDOM, TOBRATROUGH, TOBRAPEAK, TOBRARND, AMIKACINPEAK, AMIKACINTROU, AMIKACIN in the last 72 hours.   Microbiology: Recent Results (from the past 720 hour(s))  Culture, blood (routine x 2)     Status: None (Preliminary result)   Collection Time: 01/21/15  6:48 PM  Result Value Ref Range Status   Specimen Description BLOOD RIGHT WRIST  Final   Special Requests BAA,ANA,AER,6ML  Final   Culture NO GROWTH 2 DAYS  Final   Report Status PENDING  Incomplete  Culture, blood (routine x 2)     Status: None (Preliminary result)   Collection Time: 01/21/15  6:48 PM  Result Value Ref Range Status   Specimen Description BLOOD LEFT ARM  Final   Special Requests BAA,ANA,AER,7ML  Final   Culture NO GROWTH 2 DAYS  Final   Report Status PENDING  Incomplete    Medical History: Past Medical History  Diagnosis Date  . Diabetes mellitus without complication ([place])   . Hypertension   . Arthritis   . High cholesterol     Medications:  Prescriptions prior to admission  Medication Sig Dispense Refill Last Dose  . aspirin EC 81  MG tablet Take 81 mg by mouth daily.   unknown at unknown  . celecoxib (CELEBREX) 200 MG capsule Take 200 mg by mouth daily as needed for mild pain.   PRN at PRN  . docusate sodium (COLACE) 100 MG capsule Take 100 mg by mouth daily.   unknown at unknown  . DULoxetine (CYMBALTA) 20 MG capsule Take 20 mg by mouth daily.   unknown at unknown  . furosemide (LASIX) 40 MG tablet Take 40 mg by mouth daily.   unknown at unknown   . metFORMIN (GLUCOPHAGE-XR) 500 MG 24 hr tablet Take 500 mg by mouth daily.   unknown at unknown  . simvastatin (ZOCOR) 20 MG tablet Take 20 mg by mouth daily.   unknown at unknown   Assessment: 82 y/o F with sepsis and diabetic foot ulcer without osteomyelitis. Patient received 3000 mg of vancomycin in last 24 hours due to stacked dosing. Vancomycin currently dosed 1500 mg iv q 18 hours.   Ke= 0.031 h-1 Vd= 45.4 L  Goal of Therapy:  Vancomycin trough level 15-20 mcg/ml  Plan:  Continue Zosyn 3.375 g EI q 8 hours.   SCr is increasing. Will recheck in AM. For now, will decrease dose to 750 mg iv q 24 hours and plan on checking a level before tomorrow's dose to r/o elevated level. Vancomycin dose is held on the [facility] so will need to release hold if level is < 20 mcg/ml.   Will continue  to follow renal function and culture results.   Ulice Dash D 01/23/2015,8:58 AM

## 2015-01-24 LAB — CBC
HEMATOCRIT: 32.8 % — AB (ref 40.0–52.0)
Hemoglobin: 10 g/dL — ABNORMAL LOW (ref 13.0–18.0)
MCH: 22.3 pg — ABNORMAL LOW (ref 26.0–34.0)
MCHC: 30.6 g/dL — AB (ref 32.0–36.0)
MCV: 72.8 fL — ABNORMAL LOW (ref 80.0–100.0)
Platelets: 338 10*3/uL (ref 150–440)
RBC: 4.5 MIL/uL (ref 4.40–5.90)
RDW: 17.5 % — AB (ref 11.5–14.5)
WBC: 22.6 10*3/uL — ABNORMAL HIGH (ref 3.8–10.6)

## 2015-01-24 LAB — BASIC METABOLIC PANEL
Anion gap: 6 (ref 5–15)
BUN: 21 mg/dL — AB (ref 6–20)
CALCIUM: 7.7 mg/dL — AB (ref 8.9–10.3)
CO2: 25 mmol/L (ref 22–32)
CREATININE: 0.81 mg/dL (ref 0.61–1.24)
Chloride: 109 mmol/L (ref 101–111)
GFR calc non Af Amer: >60 mL/min (ref >60)
Glucose, Bld: 136 mg/dL — ABNORMAL HIGH (ref 65–99)
Potassium: 3.7 mmol/L (ref 3.5–5.1)
SODIUM: 140 mmol/L (ref 135–145)

## 2015-01-24 LAB — APTT: aPTT: 43 seconds — ABNORMAL HIGH (ref 24–36)

## 2015-01-24 LAB — MAGNESIUM: MAGNESIUM: 2.3 mg/dL (ref 1.7–2.4)

## 2015-01-24 LAB — GLUCOSE, CAPILLARY
GLUCOSE-CAPILLARY: 212 mg/dL — AB (ref 65–99)
GLUCOSE-CAPILLARY: 175 mg/dL — AB (ref 65–99)
Glucose-Capillary: 147 mg/dL — ABNORMAL HIGH (ref 65–99)
Glucose-Capillary: 226 mg/dL — ABNORMAL HIGH (ref 65–99)

## 2015-01-24 LAB — PROTIME-INR
INR: 1.59
PROTHROMBIN TIME: 19 s — AB (ref 11.4–15.0)

## 2015-01-24 LAB — HEPARIN LEVEL (UNFRACTIONATED): HEPARIN UNFRACTIONATED: 0.94 [IU]/mL — AB (ref 0.30–0.70)

## 2015-01-24 LAB — SEDIMENTATION RATE: Sed Rate: 88 mm/hr — ABNORMAL HIGH (ref 0–20)

## 2015-01-24 LAB — VANCOMYCIN, RANDOM: Vancomycin Rm: 10 ug/mL

## 2015-01-24 LAB — C-REACTIVE PROTEIN: CRP: 26.3 mg/dL — ABNORMAL HIGH (ref <1.0)

## 2015-01-24 MED ORDER — VANCOMYCIN HCL IN DEXTROSE 750-5 MG/150ML-% IV SOLN
750.0000 mg | INTRAVENOUS | Status: DC
  Administered 2015-01-24 – 2015-01-25 (×2): 750 mg via INTRAVENOUS
  Filled 2015-01-24 (×2): qty 150

## 2015-01-24 MED ORDER — HEPARIN (PORCINE) IN NACL 100-0.45 UNIT/ML-% IJ SOLN
1300.0000 [IU]/h | INTRAMUSCULAR | Status: DC
  Administered 2015-01-25: 1100 [IU]/h via INTRAVENOUS
  Administered 2015-01-25: 900 [IU]/h via INTRAVENOUS
  Administered 2015-01-26: 1200 [IU]/h via INTRAVENOUS
  Filled 2015-01-24 (×6): qty 250

## 2015-01-24 MED ORDER — APIXABAN 5 MG PO TABS
5.0000 mg | ORAL_TABLET | Freq: Two times a day (BID) | ORAL | Status: DC
  Administered 2015-01-24: 5 mg via ORAL
  Filled 2015-01-24: qty 1

## 2015-01-24 NOTE — Progress Notes (Signed)
ANTIBIOTIC CONSULT NOTE - INITIAL  Pharmacy Consult for Vancomycin/Zosyn  Indication: cellulitis, sepsis  No Known Allergies  Patient Measurements: Height: 5\' 6"  (167.6 cm) Weight: 161 lb (73.029 kg) IBW/kg (Calculated) : 63.8 Adjusted Body Weight: 84.7 kg   Vital Signs: Temp: 99.5 F (37.5 C) (01/14 0438) Temp Source: Oral (01/14 0438) BP: 155/52 mmHg (01/14 0438) Pulse Rate: 78 (01/14 0438) Intake/Output from previous day: 01/13 0701 - 01/14 0700 In: 750 [I.V.:450; IV Piggyback:300] Out: 103 [Urine:100; Blood:3] Intake/Output from this shift: Total I/O In: 300 [IV Piggyback:300] Out: 100 [Urine:100]  Labs:  Recent Labs  01/21/15 1700 01/22/15 0452 01/23/15 0523 01/24/15 0329  WBC 30.4* 22.9*  --  22.6*  HGB 10.9* 9.6*  --  10.0*  PLT 308 299  --  338  CREATININE 0.94 0.87 1.08 0.81   Estimated Creatinine Clearance: 49.2 mL/min (by C-G formula based on Cr of 0.81).  Recent Labs  01/24/15 0329  [facility] 10     Microbiology: Recent Results (from the past 720 hour(s))  Culture, blood (routine x 2)     Status: None (Preliminary result)   Collection Time: 01/21/15  6:48 PM  Result Value Ref Range Status   Specimen Description BLOOD RIGHT WRIST  Final   Special Requests BAA,ANA,AER,6ML  Final   Culture NO GROWTH 2 DAYS  Final   Report Status PENDING  Incomplete  Culture, blood (routine x 2)     Status: None (Preliminary result)   Collection Time: 01/21/15  6:48 PM  Result Value Ref Range Status   Specimen Description BLOOD LEFT ARM  Final   Special Requests BAA,ANA,AER,7ML  Final   Culture NO GROWTH 2 DAYS  Final   Report Status PENDING  Incomplete  Wound culture     Status: None (Preliminary result)   Collection Time: 01/22/15  5:50 PM  Result Value Ref Range Status   Specimen Description ABSCESS  Final   Special Requests Immunocompromised  Final   Gram Stain RARE WBC SEEN FEW GRAM POSITIVE COCCI   Final   Culture NO GROWTH < 24 HOURS  Final   Report Status PENDING  Incomplete    Medical History: Past Medical History  Diagnosis Date  . Diabetes mellitus without complication ([place])   . Hypertension   . Arthritis   . High cholesterol     Medications:  Prescriptions prior to admission  Medication Sig Dispense Refill Last Dose  . aspirin EC 81 MG tablet Take 81 mg by mouth daily.   unknown at unknown  . celecoxib (CELEBREX) 200 MG capsule Take 200 mg by mouth daily as needed for mild pain.   PRN at PRN  . docusate sodium (COLACE) 100 MG capsule Take 100 mg by mouth daily.   unknown at unknown  . DULoxetine (CYMBALTA) 20 MG capsule Take 20 mg by mouth daily.   unknown at unknown  . metFORMIN (GLUCOPHAGE-XR) 500 MG 24 hr tablet Take 500 mg by mouth daily.   unknown at unknown  . simvastatin (ZOCOR) 20 MG tablet Take 20 mg by mouth daily.   unknown at unknown  . furosemide (LASIX) 40 MG tablet Take 40 mg by mouth daily. Reported on 01/23/2015   Not Taking at unknown    Assessment: 82 y/o F with sepsis and diabetic foot ulcer without osteomyelitis. Patient received 3000 mg of vancomycin in last 24 hours due to stacked dosing. Vancomycin currently dosed 1500 mg iv q 18 hours.   Ke= 0.031 h-1 Vd= 45.4 L  Goal of Therapy:  Vancomycin trough level 15-20 mcg/ml  Plan:  Continue Zosyn 3.375 g EI q 8 hours.   SCr is increasing. Will recheck in AM. For now, will decrease dose to 750 mg iv q 24 hours and plan on checking a level before tomorrow's dose to r/o elevated level. Vancomycin dose is held on the [facility] so will need to release hold if level is < 20 mcg/ml.   Will continue to follow renal function and culture results.   1/14 AM level 10.  Resumed vanc 750 mg IV q 24 hours. Will schedule level before 3rd dose to evaluate clearance.  Khiya Friese S 01/24/2015,4:46 AM

## 2015-01-24 NOTE — Progress Notes (Signed)
Daily Progress Note   Subjective  - 1 Day Post-Op  F/u right foot I & D.  Pain is mild.  Objective Filed Vitals:   01/23/15 1932 01/23/15 2339 01/24/15 0438 01/24/15 0728  BP: 139/56 145/57 155/52 143/62  Pulse: 79 71 78 82  Temp: 98.6 F (37 C) 98.2 F (36.8 C) 99.5 F (37.5 C) 98.9 F (37.2 C)  TempSrc: Oral Oral Oral Oral  Resp: 20 20 22 18   Height:      Weight:      SpO2: 90% 93% 91% 90%    Physical Exam: Still some erythema diffusely to dorsal right foot.  Medial 1st mtpj eschar slightly larger and incision has noted dusky appearance along arch.  No purulence today and packing removed and repacked at abscess site.  Laboratory CBC    Component Value Date/Time   WBC 22.6* 01/24/2015 0329   HGB 10.0* 01/24/2015 0329   HCT 32.8* 01/24/2015 0329   PLT 338 01/24/2015 0329    BMET    Component Value Date/Time   NA 140 01/24/2015 0329   K 3.7 01/24/2015 0329   CL 109 01/24/2015 0329   CO2 25 01/24/2015 0329   GLUCOSE 136* 01/24/2015 0329   BUN 21* 01/24/2015 0329   CREATININE 0.81 01/24/2015 0329   CALCIUM 7.7* 01/24/2015 0329   GFRNONAA >60 01/24/2015 0329   GFRAA >60 01/24/2015 0329    Assessment/Planning: Abscess right 1st mtpj with extension to medial arch and areas of necrosis along incision and medial 1st mtpj.    WBC remains elevated.  Awaiting cultures   Concerned of continued progression necrosis along medial arch incision.  D/W vascular and will likely plan for angio early next week.  Will continue to monitor and d/w pt and family he is at high risk of future debridement and possible amputation.  Samara Deist A  01/24/2015, 11:11 AM

## 2015-01-24 NOTE — Consult Note (Addendum)
Reason for Consult: Foot wound, diminished peripheral circulation Referring Physician: Dr. Venita Sheffield [place] is an 82 y.o. male.  HPI: Pleasant gentleman with history of DM presented with progressive right foot/ great toe pain that developed into an abscess. He states the pain began a few days ago and he noted a discoloration at the base of the right great toe that worsened. He denies claudication or rest pain. He is quite independent and is able to ambulate with no or little assist. He notes occasional foot pain and neuropathy that has been followed by Podiatry in the past. He denies neurologic or CVA symptoms.  Past Medical History  Diagnosis Date  . Diabetes mellitus without complication ([place])   . Hypertension   . Arthritis   . High cholesterol     Past Surgical History  Procedure Laterality Date  . Appendectomy    . Irrigation and debridement foot Right 01/23/2015    Procedure: IRRIGATION AND DEBRIDEMENT FOOT;  Surgeon: Samara Deist, DPM;  Location: [facility] ORS;  Service: Podiatry;  Laterality: Right;    Family History  Problem Relation Age of Onset  . Prostate cancer Father     Social History:  reports that he has quit smoking. He has never used smokeless tobacco. He reports that he does not drink alcohol or use illicit drugs.  Allergies: No Known Allergies  Medications: I have reviewed the patient's current medications.  Results for orders placed or performed during the hospital encounter of 01/21/15 (from the past 48 hour(s))  Glucose, capillary     Status: Abnormal   Collection Time: 01/22/15 11:11 AM  Result Value Ref Range   Glucose-Capillary 142 (H) 65 - 99 mg/dL   Comment 1 Notify RN   Glucose, capillary     Status: Abnormal   Collection Time: 01/22/15  3:30 PM  Result Value Ref Range   Glucose-Capillary 140 (H) 65 - 99 mg/dL   Comment 1 Notify RN   Wound culture     Status: None (Preliminary result)   Collection Time: 01/22/15  5:50 PM  Result Value Ref  Range   Specimen Description ABSCESS    Special Requests Immunocompromised    Gram Stain RARE WBC SEEN FEW GRAM POSITIVE COCCI     Culture      RARE GROWTH UNIDENTIFIED ORGANISM IDENTIFICATION TO FOLLOW    Report Status PENDING   Glucose, capillary     Status: Abnormal   Collection Time: 01/22/15  8:59 PM  Result Value Ref Range   Glucose-Capillary 154 (H) 65 - 99 mg/dL  Creatinine, serum     Status: Abnormal   Collection Time: 01/23/15  5:23 AM  Result Value Ref Range   Creatinine, Ser 1.08 0.61 - 1.24 mg/dL   GFR calc non Af Amer 56 (L) >60 mL/min   GFR calc Af Amer >60 >60 mL/min    Comment: (NOTE) The eGFR has been calculated using the CKD EPI equation. This calculation has not been validated in all clinical situations. eGFR's persistently <60 mL/min signify possible Chronic Kidney Disease.   Glucose, capillary     Status: Abnormal   Collection Time: 01/23/15  7:35 AM  Result Value Ref Range   Glucose-Capillary 115 (H) 65 - 99 mg/dL   Comment 1 Notify RN   Culture, routine-abscess     Status: None (Preliminary result)   Collection Time: 01/23/15 10:32 AM  Result Value Ref Range   Specimen Description WOUND    Special Requests NONE    Gram  Stain PENDING    Culture      LIGHT GROWTH STAPHYLOCOCCUS AUREUS SUSCEPTIBILITIES TO FOLLOW    Report Status PENDING   Glucose, capillary     Status: Abnormal   Collection Time: 01/23/15 11:42 AM  Result Value Ref Range   Glucose-Capillary 124 (H) 65 - 99 mg/dL  Glucose, capillary     Status: Abnormal   Collection Time: 01/23/15 12:21 PM  Result Value Ref Range   Glucose-Capillary 115 (H) 65 - 99 mg/dL   Comment 1 Notify RN   Phosphorus     Status: Abnormal   Collection Time: 01/23/15  4:20 PM  Result Value Ref Range   Phosphorus 2.3 (L) 2.5 - 4.6 mg/dL  Glucose, capillary     Status: Abnormal   Collection Time: 01/23/15  4:24 PM  Result Value Ref Range   Glucose-Capillary 201 (H) 65 - 99 mg/dL   Comment 1 Notify RN    Glucose, capillary     Status: Abnormal   Collection Time: 01/23/15  8:17 PM  Result Value Ref Range   Glucose-Capillary 173 (H) 65 - 99 mg/dL   Comment 1 Notify RN   Vancomycin, random     Status: None   Collection Time: 01/24/15  3:29 AM  Result Value Ref Range   Vancomycin Rm 10 ug/mL    Comment:        Random Vancomycin therapeutic range is dependent on dosage and time of specimen collection. A peak range is 20.0-40.0 ug/mL A trough range is 5.0-15.0 ug/mL          CBC     Status: Abnormal   Collection Time: 01/24/15  3:29 AM  Result Value Ref Range   WBC 22.6 (H) 3.8 - 10.6 K/uL   RBC 4.50 4.40 - 5.90 MIL/uL   Hemoglobin 10.0 (L) 13.0 - 18.0 g/dL   HCT 32.8 (L) 40.0 - 52.0 %   MCV 72.8 (L) 80.0 - 100.0 fL   MCH 22.3 (L) 26.0 - 34.0 pg   MCHC 30.6 (L) 32.0 - 36.0 g/dL   RDW 17.5 (H) 11.5 - 14.5 %   Platelets 338 150 - 440 K/uL  Basic metabolic panel     Status: Abnormal   Collection Time: 01/24/15  3:29 AM  Result Value Ref Range   Sodium 140 135 - 145 mmol/L   Potassium 3.7 3.5 - 5.1 mmol/L   Chloride 109 101 - 111 mmol/L   CO2 25 22 - 32 mmol/L   Glucose, Bld 136 (H) 65 - 99 mg/dL   BUN 21 (H) 6 - 20 mg/dL   Creatinine, Ser 0.81 0.61 - 1.24 mg/dL   Calcium 7.7 (L) 8.9 - 10.3 mg/dL   GFR calc non Af Amer >60 >60 mL/min   GFR calc Af Amer >60 >60 mL/min    Comment: (NOTE) The eGFR has been calculated using the CKD EPI equation. This calculation has not been validated in all clinical situations. eGFR's persistently <60 mL/min signify possible Chronic Kidney Disease.    Anion gap 6 5 - 15  Sedimentation rate     Status: Abnormal   Collection Time: 01/24/15  3:29 AM  Result Value Ref Range   Sed Rate 88 (H) 0 - 20 mm/hr  Magnesium     Status: None   Collection Time: 01/24/15  3:29 AM  Result Value Ref Range   Magnesium 2.3 1.7 - 2.4 mg/dL  Glucose, capillary     Status: Abnormal   Collection Time: 01/24/15  7:29 AM  Result Value Ref Range    Glucose-Capillary 147 (H) 65 - 99 mg/dL   Comment 1 Notify RN     Ct Head Wo Contrast  01/23/2015  CLINICAL DATA:  Slurred speech and decreased cognitive function recently. EXAM: CT HEAD WITHOUT CONTRAST TECHNIQUE: Contiguous axial images were obtained from the base of the skull through the vertex without intravenous contrast. COMPARISON:  None currently available FINDINGS: Skull and Sinuses:Negative for fracture or destructive process. The visualized mastoids, middle ears, and imaged paranasal sinuses are clear. Visualized orbits: Left cataract resection. Brain: No evidence of acute infarction, hemorrhage, hydrocephalus, or swelling. No extra-axial collection. There is moderate generalized atrophy with commensurate ventriculomegaly. Hazy low-density in the cerebral white matter consistent with chronic small vessel disease, with superimposed small vessel infarcts in the inferior right occipital cortex and bifrontal white matter. Lobulated soft tissue density at the upper anterior falx measuring up to 16 mm (including the superior sagittal sinus). Neighboring calcification could be dural ossification or lesional. There is no brain contact. No associated bone changes. This usually reflects meningioma in a patient without malignancy history. IMPRESSION: 1. No acute finding. 2. Moderate atrophy and chronic small vessel disease for age. 3. 13 mm mass at the anterior falx, usually reflecting meningioma. No associated mass effect. Electronically Signed   By: Monte Fantasia M.D.   On: 01/23/2015 12:19   Mr Brain Wo Contrast  01/23/2015  CLINICAL DATA:  Multiple falls, slurred speech, decreased cognitive function. EXAM: MRI HEAD WITHOUT CONTRAST TECHNIQUE: Multiplanar, multiecho pulse sequences of the brain and surrounding structures were obtained without intravenous contrast. COMPARISON:  CT head earlier in the day. FINDINGS: No evidence for acute stroke, acute hemorrhage, or extra-axial fluid. Marked ventricular  enlargement, which on first glance, has features suggestive of normal pressure hydrocephalus, with markedly dilated atria of the lateral ventricles, prominent temporal horns, and periventricular white matter signal abnormality. Given the cortical atrophy, other white matter lesions suggestive of chronic microvascular ischemic change, and chronic lacunar infarctions present, ventricular dilatation is favored to represent central atrophy and hydrocephalus ex vacuo rather than normal pressure hydrocephalus. No midline abnormality. Cervical spondylosis is suspected, with disc space narrowing and cord displacement at C4-C5. Flow voids are maintained in the carotid, basilar, and both vertebral arteries. Noncontrast examination was performed. An anterior falcine extra-axial mass, 14 x 14 mm cross-section is redemonstrated. The mass has invaded the superior sagittal sinus but otherwise appears non worrisome. No osseous erosion. This is consistent with an incidental meningioma. LEFT cataract extraction. No layering fluid in the sinuses. Unremarkable mastoids. Extracranial soft tissues unremarkable. IMPRESSION: Global atrophy with chronic microvascular ischemic change, moderately advanced. No acute intracranial findings. 14 x 14 mm extra-axial mass in the midline frontal falx. Despite the noncontrast exam, one can be reasonably certain that this represents a meningioma, an incidental finding. Suspected cervical spondylosis, incompletely evaluated on this motion degraded brain study. When the patient is better able to hold still, and if there are signs and symptoms of cervical spondylosis or myelopathy, consider MRI cervical spine for further evaluation on an elective basis. Electronically Signed   By: Staci Righter M.D.   On: 01/23/2015 19:15   Dg Chest Port 1 View  01/23/2015  CLINICAL DATA:  Central catheter placement EXAM: PORTABLE CHEST 1 VIEW COMPARISON:  Study obtained earlier in the day FINDINGS: Central catheter  tip is in the superior vena cava. No pneumothorax. There is patchy airspace consolidation in each lung base. Lungs elsewhere clear. Heart is  mildly enlarged with pulmonary vascular within normal limits. No adenopathy. There is degenerative change in each shoulder with superior migration of each humeral head. IMPRESSION: Central catheter tip in superior vena cava. No pneumothorax. Patchy infiltrate in the lung bases, likely multifocal pneumonia. Lungs otherwise clear. Stable cardiac prominence. Probable chronic rotator cuff tears bilaterally. Electronically Signed   By: Lowella Grip III M.D.   On: 01/23/2015 21:31   Dg Chest Port 1 View  01/23/2015  CLINICAL DATA:  PICC line placement.  Cellulitis. EXAM: PORTABLE CHEST 1 VIEW COMPARISON:  01/21/2015 FINDINGS: New right arm PICC line is seen with tip overlying the proximal right atrium. Low lung volumes are seen with mild bibasilar atelectasis versus developing infiltrates. No evidence of pleural effusion. Heart size is within normal limits. IMPRESSION: Right arm PICC line tip overlies the proximal right atrium. Low lung volumes with development of mild bibasilar atelectasis versus infiltrates. Electronically Signed   By: Earle Gell M.D.   On: 01/23/2015 21:12    Review of Systems  Constitutional: Negative.   HENT: Negative.   Eyes: Negative.   Respiratory: Negative.   Cardiovascular: Negative.   Gastrointestinal: Negative.   Genitourinary: Negative.   Musculoskeletal:       Right foot surgical incisional pain  Skin: Negative.   Neurological:       Intermittent slurred speech  Endo/Heme/Allergies: Negative.   Psychiatric/Behavioral: Negative.    Blood pressure 143/62, pulse 82, temperature 98.9 F (37.2 C), temperature source Oral, resp. rate 18, height 5' 6"  (1.676 m), weight 73.029 kg (161 lb), SpO2 90 %. Physical Exam  Vitals reviewed. Constitutional: He is oriented to person, place, and time. He appears well-developed.  HENT:   Head: Normocephalic.  Eyes: Pupils are equal, round, and reactive to light.  Neck: Normal range of motion. Neck supple.  Cardiovascular: Exam reveals no gallop and no friction rub.   No murmur heard. irregular  Respiratory: Effort normal and breath sounds normal.  GI: Soft. He exhibits no distension and no mass. There is no tenderness.  Musculoskeletal:  Ext: warm. Right foot with surgical dressing. Good capillary refill at toes. Monophasic PT. Decreased sensation-baseline  Lymphadenopathy:    He has no cervical adenopathy.  Neurological: He is alert and oriented to person, place, and time.  Intermittent slurred speech during exam with quick resolution  Skin: Skin is warm. No rash noted. No erythema.  Psychiatric: He has a normal mood and affect. His behavior is normal.    Vascular: No carotid bruit 2+, 2+ radial, 2+ femoral, biphasic popliteals, left- +DP biphasic, right- +PT monophasic, good capillary refill  Assessment/Plan:  Diabetic Right foot Abscess s/p I and D  The most pressing concern is his slurred speech. Carotid duplex performed in October 2016 revealed mild stenosis bilaterally. CT and MRI of the head were normal. Cardiac evaluation reveals AS with new AFib, he should begin anticoagulation now.  The patient does have diminished peripheral circulation however, his foot infection is mostly secondary to DM. There is no evidence of limb ischemia or concern that his wound would not heal. Nonetheless he will require outpatient Vascular evaluation for any potential intervention.     Esco, Miechia A 01/24/2015, 10:05 AM   Discussion with and further evaluation per Podiatry. The foot wound has worsened significantly since yesterday. Will plan for angiogram with possible intervention on Tuesday per Dr. Delana Meyer. Extensive discussion with family regarding current plan and potential outcomes.

## 2015-01-24 NOTE — Progress Notes (Signed)
Spoke with Dr. Vickki Muff and Dr. Lorenso Courier regarding patient's foot looking worse per Dr. Vickki Muff. Vascular wants to do an angiogram possibly Tuesday per Dr. Lorenso Courier. Heparin drip needs to be started tomorrow since patient already received dose of Eliquis this morning. Updated Dr. Benjie Karvonen per MD request - she will contact pharmacy regarding heparin drip for tomorrow.

## 2015-01-24 NOTE — Progress Notes (Addendum)
ANTICOAGULATION CONSULT NOTE - Follow Up Consult  Pharmacy Consult for heparin Indication: atrial fibrillation  No Known Allergies  Patient Measurements: Height: 5\' 6"  (167.6 cm) Weight: 161 lb (73.029 kg) IBW/kg (Calculated) : 63.8  Vital Signs: Temp: 98.4 F (36.9 C) (01/14 1931) Temp Source: Oral (01/14 1931) BP: 138/66 mmHg (01/14 1931) Pulse Rate: 79 (01/14 1931)  Labs:  Recent Labs  01/22/15 0452 01/23/15 0523 01/24/15 0329 01/24/15 2301  HGB 9.6*  --  10.0*  --   HCT 31.4*  --  32.8*  --   PLT 299  --  338  --   APTT  --   --   --  43*  LABPROT  --   --   --  19.0*  INR  --   --   --  1.59  HEPARINUNFRC  --   --   --  0.94*  CREATININE 0.87 1.08 0.81  --     Estimated Creatinine Clearance: 49.2 mL/min (by C-G formula based on Cr of 0.81).   Medical History: Past Medical History  Diagnosis Date  . Diabetes mellitus without complication ([place])   . Hypertension   . Arthritis   . High cholesterol     Medications:  Scheduled:  . aspirin EC  81 mg Oral Daily  . docusate sodium  100 mg Oral Daily  . DULoxetine  20 mg Oral Daily  . feeding supplement (ENSURE ENLIVE)  237 mL Oral TID WC  . furosemide  40 mg Oral Daily  . insulin aspart  0-5 Units Subcutaneous QHS  . insulin aspart  0-9 Units Subcutaneous TID WC  . piperacillin-tazobactam (ZOSYN)  IV  3.375 g Intravenous 3 times per day  . simvastatin  20 mg Oral Daily  . vancomycin  750 mg Intravenous Q24H    Assessment: Pharmacy consulted to start heparin in a 82 yo male with atrial fibrillation due to angiogram scheduled in the next couple days.  Patient received last dose of Lovenox 40 mg subq on 1/14 at ~0100 and last dose of apixaban 5mg  at 1051 on 1/14.    SCr: 0.81, Wt: 73 kg, Hgb: 10 plt 338 INR 1.59  aPTT 43  Anti-Xa 0.94  Goal of Therapy:  Monitor SCr and CBC per anticoagulation policy   Plan:  Discontinued Lovenox 40 mg subq daily. Pt received one dose of apixaban 5mg  at 1051 on 1/14.    Will start heparin drip 12 hours later with baseline aPTT, INR, and heparin level drawn prior to initiation.   Initial rate 900 units/hr. No bolus due to Xa still elevated due to Eliquis dose. First aPTT and anti-Xa 6 hours after start of infusion.  1/15 AM aPTT 58, heparin level 0.68. Increase to 1000 units/hr and recheck aPTT and heparin level in 6 hours.  Pharmacy will continue to follow.   Saifan Rayford S 01/24/2015,11:39 PM

## 2015-01-24 NOTE — Progress Notes (Signed)
ANTICOAGULATION CONSULT NOTE - Initial Consult  Pharmacy Consult for apixaban (Eliquis) Indication: atrial fibrillation  No Known Allergies  Patient Measurements: Height: 5\' 6"  (167.6 cm) Weight: 161 lb (73.029 kg) IBW/kg (Calculated) : 63.8  Vital Signs: Temp: 98.9 F (37.2 C) (01/14 0728) Temp Source: Oral (01/14 0728) BP: 143/62 mmHg (01/14 0728) Pulse Rate: 82 (01/14 0728)  Labs:  Recent Labs  01/21/15 1700 01/22/15 0452 01/23/15 0523 01/24/15 0329  HGB 10.9* 9.6*  --  10.0*  HCT 35.8* 31.4*  --  32.8*  PLT 308 299  --  338  CREATININE 0.94 0.87 1.08 0.81    Estimated Creatinine Clearance: 49.2 mL/min (by C-G formula based on Cr of 0.81).   Medical History: Past Medical History  Diagnosis Date  . Diabetes mellitus without complication ([place])   . Hypertension   . Arthritis   . High cholesterol     Medications:  Scheduled:  . apixaban  5 mg Oral BID  . aspirin EC  81 mg Oral Daily  . docusate sodium  100 mg Oral Daily  . DULoxetine  20 mg Oral Daily  . feeding supplement (ENSURE ENLIVE)  237 mL Oral TID WC  . furosemide  40 mg Oral Daily  . insulin aspart  0-5 Units Subcutaneous QHS  . insulin aspart  0-9 Units Subcutaneous TID WC  . piperacillin-tazobactam (ZOSYN)  IV  3.375 g Intravenous 3 times per day  . simvastatin  20 mg Oral Daily  . vancomycin  750 mg Intravenous Q24H    Assessment: Pharmacy consulted to start apixaban in a 82 yo male with atrial fibrillation.  Patient received last dose of Lovenox 40 mg subq on 1/14 at ~0100.  SCr: 0.81, Wt: 73 kg, Hgb: 10  Goal of Therapy:  Monitor SCr and CBC per anticoagulation policy   Plan:  Discontinued Lovenox 40 mg subq daily. Will start patient on apixaban 5 mg po BID based on age, weight, and renal function.   Pharmacy will continue to follow.   Ry Moody G 01/24/2015,10:36 AM

## 2015-01-24 NOTE — Progress Notes (Signed)
sw Dr Benjie Karvonen concerning renewing telemetry and response to continue tele due to pt in A Fib rhythum

## 2015-01-24 NOTE — Progress Notes (Signed)
Physical Therapy Treatment Patient Details Name: Spencer [name] MRN: [id number] DOB: 04-20-1917 Today's Date: 01/24/2015    History of Present Illness Pt here with R LE cellulitis, having significant circulatory issues, will likely have an angioplasty next week.    PT Comments    Pt very hard of hearing making some communication difficult but he is willing and ready to work with PT and shows very good effort. Apparently he was very independent at baseline using a cane.  Pt is able to maintain WBing on R with standing and 2 labored side hops, but he becomes very fatigue with this effort - O2 dropping to 88%.    Follow Up Recommendations  SNF     Equipment Recommendations  Rolling walker with 5" wheels    Recommendations for Other Services       Precautions / Restrictions Precautions Precautions: Fall Restrictions Weight Bearing Restrictions: Yes RLE Weight Bearing: Non weight bearing    Mobility  Bed Mobility Overal bed mobility: Modified Independent             General bed mobility comments: Pt needs some extra time getting to EOB, but does not need assist  Transfers Overall transfer level: Needs assistance Equipment used: Rolling walker (2 wheeled) Transfers: Sit to/from Stand Sit to Stand: Min assist            Ambulation/Gait             General Gait Details: Pt unable to ambulate, he was able to take 2 very small, unsure side hop steps at EOB with assist and heavy UE use on walker - though he was able to maintain WBing on the R. Pt becomes very fatigued with the effort.   Stairs            Wheelchair Mobility    Modified Rankin (Stroke Patients Only)       Balance                                    Cognition Arousal/Alertness: Awake/alert Behavior During Therapy: WFL for tasks assessed/performed Overall Cognitive Status: Within Functional Limits for tasks assessed                      Exercises       General Comments        Pertinent Vitals/Pain Pain Assessment:  (not rated, moderate pain, states it was very bad yesterday)    Home Living Family/patient expects to be discharged to:: [place]: [name] - single point      Prior Function Level of Independence: Independent      Comments: Pt was very active, driving, taking care of himself w/o issue.   PT Goals (current goals can now be found in the care plan section) Acute Rehab PT Goals Patient Stated Goal: "Stop this foot from hurting" PT Goal Formulation: With patient Time For Goal Achievement: 02/07/15 Potential to Achieve Goals: Fair    Frequency  Min 2X/week    PT Plan      Co-evaluation             End of Session Equipment Utilized During Treatment: Gait belt Activity Tolerance: Patient limited by fatigue Patient left: with call bell/phone within reach;with bed alarm set;with family/visitor present     Time: [id number] PT Time Calculation (min) (  ACUTE ONLY): 25 min  Charges:                       G Codes:     Wayne Both, PT, DPT [phone]  Kreg Shropshire 01/24/2015, 5:54 PM

## 2015-01-24 NOTE — Progress Notes (Signed)
ANTICOAGULATION CONSULT NOTE - Follow Up Consult  Pharmacy Consult for heparin Indication: atrial fibrillation  No Known Allergies  Patient Measurements: Height: 5\' 6"  (167.6 cm) Weight: 161 lb (73.029 kg) IBW/kg (Calculated) : 63.8  Vital Signs: Temp: 98.3 F (36.8 C) (01/14 1118) Temp Source: Oral (01/14 1118) BP: 142/51 mmHg (01/14 1118) Pulse Rate: 81 (01/14 1118)  Labs:  Recent Labs  01/21/15 1700 01/22/15 0452 01/23/15 0523 01/24/15 0329  HGB 10.9* 9.6*  --  10.0*  HCT 35.8* 31.4*  --  32.8*  PLT 308 299  --  338  CREATININE 0.94 0.87 1.08 0.81    Estimated Creatinine Clearance: 49.2 mL/min (by C-G formula based on Cr of 0.81).   Medical History: Past Medical History  Diagnosis Date  . Diabetes mellitus without complication ([place])   . Hypertension   . Arthritis   . High cholesterol     Medications:  Scheduled:  . aspirin EC  81 mg Oral Daily  . docusate sodium  100 mg Oral Daily  . DULoxetine  20 mg Oral Daily  . feeding supplement (ENSURE ENLIVE)  237 mL Oral TID WC  . furosemide  40 mg Oral Daily  . insulin aspart  0-5 Units Subcutaneous QHS  . insulin aspart  0-9 Units Subcutaneous TID WC  . piperacillin-tazobactam (ZOSYN)  IV  3.375 g Intravenous 3 times per day  . simvastatin  20 mg Oral Daily  . vancomycin  750 mg Intravenous Q24H    Assessment: Pharmacy consulted to start heparin in a 82 yo male with atrial fibrillation due to angiogram scheduled in the next couple days.  Patient received last dose of Lovenox 40 mg subq on 1/14 at ~0100 and last dose of apixaban 5mg  at 1051 on 1/14.    SCr: 0.81, Wt: 73 kg, Hgb: 10  Goal of Therapy:  Monitor SCr and CBC per anticoagulation policy   Plan:  Discontinued Lovenox 40 mg subq daily. Pt received one dose of apixaban 5mg  at 1051 on 1/14.   Will start heparin drip 12 hours later with baseline aPTT, INR, and heparin level drawn prior to initiation.   Pharmacy will continue to follow.    Vena Rua 01/24/2015,11:20 AM

## 2015-01-24 NOTE — Progress Notes (Addendum)
[facility] NAME: Ryn Skubal    MR#:  [id number]  DATE OF BIRTH:  [birth year]  SUBJECTIVE:   No acute issues overnight. Patient reports no pain. Patient does not have slurred speech. REVIEW OF SYSTEMS:    Review of Systems  Constitutional: Negative for fever, chills and malaise/fatigue.  HENT: Negative for sore throat.   Eyes: Negative for blurred vision.  Respiratory: Negative for cough, hemoptysis, shortness of breath and wheezing.   Cardiovascular: Negative for chest pain, palpitations and leg swelling.  Gastrointestinal: Negative for nausea, vomiting, abdominal pain, diarrhea and blood in stool.  Genitourinary: Negative for dysuria.  Musculoskeletal: Negative for back pain.  Skin:       RLE cellulitis wrapped  Neurological: Negative for dizziness, tremors and headaches.  Endo/Heme/Allergies: Does not bruise/bleed easily.    Tolerating Diet:yes      DRUG ALLERGIES:  No Known Allergies  VITALS:  Blood pressure 143/62, pulse 82, temperature 98.9 F (37.2 C), temperature source Oral, resp. rate 18, height 5\' 6"  (1.676 m), weight 73.029 kg (161 lb), SpO2 90 %.  PHYSICAL EXAMINATION:   Physical Exam  Constitutional: He is oriented to person, place, and time and well-developed, well-nourished, and in no distress. No distress.  HENT:  Head: Normocephalic.  Eyes: No scleral icterus.  Neck: Normal range of motion. Neck supple. No JVD present. No tracheal deviation present.  Cardiovascular: Normal rate, regular rhythm and normal heart sounds.  Exam reveals no gallop and no friction rub.   No murmur heard. Pulmonary/Chest: Effort normal and breath sounds normal. No respiratory distress. He has no wheezes. He has no rales. He exhibits no tenderness.  Abdominal: Soft. Bowel sounds are normal. He exhibits no distension and no mass. There is no tenderness. There is no rebound and no guarding.  Musculoskeletal:  Normal range of motion. He exhibits no edema.  Neurological: He is alert and oriented to person, place, and time.  Skin: Skin is warm. No rash noted. No erythema.  His foot is wrapped  Psychiatric: Affect and judgment normal.      LABORATORY PANEL:   CBC  Recent Labs Lab 01/24/15 0329  WBC 22.6*  HGB 10.0*  HCT 32.8*  PLT 338   ------------------------------------------------------------------------------------------------------------------  Chemistries   Recent Labs Lab 01/24/15 0329  NA 140  K 3.7  CL 109  CO2 25  GLUCOSE 136*  BUN 21*  CREATININE 0.81  CALCIUM 7.7*  MG 2.3   ------------------------------------------------------------------------------------------------------------------  Cardiac Enzymes No results for input(s): TROPONINI in the last 168 hours. ------------------------------------------------------------------------------------------------------------------  RADIOLOGY:  Ct Head Wo Contrast  01/23/2015  CLINICAL DATA:  Slurred speech and decreased cognitive function recently. EXAM: CT HEAD WITHOUT CONTRAST TECHNIQUE: Contiguous axial images were obtained from the base of the skull through the vertex without intravenous contrast. COMPARISON:  None currently available FINDINGS: Skull and Sinuses:Negative for fracture or destructive process. The visualized mastoids, middle ears, and imaged paranasal sinuses are clear. Visualized orbits: Left cataract resection. Brain: No evidence of acute infarction, hemorrhage, hydrocephalus, or swelling. No extra-axial collection. There is moderate generalized atrophy with commensurate ventriculomegaly. Hazy low-density in the cerebral white matter consistent with chronic small vessel disease, with superimposed small vessel infarcts in the inferior right occipital cortex and bifrontal white matter. Lobulated soft tissue density at the upper anterior falx measuring up to 16 mm (including the superior sagittal sinus).  Neighboring calcification could be dural ossification or lesional. There is no brain contact. No  associated bone changes. This usually reflects meningioma in a patient without malignancy history. IMPRESSION: 1. No acute finding. 2. Moderate atrophy and chronic small vessel disease for age. 3. 13 mm mass at the anterior falx, usually reflecting meningioma. No associated mass effect. Electronically Signed   By: Monte Fantasia M.D.   On: 01/23/2015 12:19   Mr Brain Wo Contrast  01/23/2015  CLINICAL DATA:  Multiple falls, slurred speech, decreased cognitive function. EXAM: MRI HEAD WITHOUT CONTRAST TECHNIQUE: Multiplanar, multiecho pulse sequences of the brain and surrounding structures were obtained without intravenous contrast. COMPARISON:  CT head earlier in the day. FINDINGS: No evidence for acute stroke, acute hemorrhage, or extra-axial fluid. Marked ventricular enlargement, which on first glance, has features suggestive of normal pressure hydrocephalus, with markedly dilated atria of the lateral ventricles, prominent temporal horns, and periventricular white matter signal abnormality. Given the cortical atrophy, other white matter lesions suggestive of chronic microvascular ischemic change, and chronic lacunar infarctions present, ventricular dilatation is favored to represent central atrophy and hydrocephalus ex vacuo rather than normal pressure hydrocephalus. No midline abnormality. Cervical spondylosis is suspected, with disc space narrowing and cord displacement at C4-C5. Flow voids are maintained in the carotid, basilar, and both vertebral arteries. Noncontrast examination was performed. An anterior falcine extra-axial mass, 14 x 14 mm cross-section is redemonstrated. The mass has invaded the superior sagittal sinus but otherwise appears non worrisome. No osseous erosion. This is consistent with an incidental meningioma. LEFT cataract extraction. 82 No layering fluid in the sinuses. Unremarkable mastoids.  Extracranial soft tissues unremarkable. IMPRESSION: Global atrophy with chronic microvascular ischemic change, moderately advanced. No acute intracranial findings. 14 x 14 mm extra-axial mass in the midline frontal falx. Despite the noncontrast exam, one can be reasonably certain that this represents a meningioma, an incidental finding. Suspected cervical spondylosis, incompletely evaluated on this motion degraded brain study. When the patient is better able to hold still, and if there are signs and symptoms of cervical spondylosis or myelopathy, consider MRI cervical spine for further evaluation on an elective basis. Electronically Signed   By: Staci Righter M.D.   On: 01/23/2015 19:15   Dg Chest Port 1 View  01/23/2015  CLINICAL DATA:  Central catheter placement EXAM: PORTABLE CHEST 1 VIEW COMPARISON:  Study obtained earlier in the day FINDINGS: Central catheter tip is in the superior vena cava. No pneumothorax. There is patchy airspace consolidation in each lung base. Lungs elsewhere clear. Heart is mildly enlarged with pulmonary vascular within normal limits. No adenopathy. There is degenerative change in each shoulder with superior migration of each humeral head. IMPRESSION: Central catheter tip in superior vena cava. No pneumothorax. Patchy infiltrate in the lung bases, likely multifocal pneumonia. Lungs otherwise clear. Stable cardiac prominence. Probable chronic rotator cuff tears bilaterally. Electronically Signed   By: Lowella Grip III M.D.   On: 01/23/2015 21:31   Dg Chest Port 1 View  01/23/2015  CLINICAL DATA:  PICC line placement.  Cellulitis. EXAM: PORTABLE CHEST 1 VIEW COMPARISON:  01/21/2015 FINDINGS: New right arm PICC line is seen with tip overlying the proximal right atrium. Low lung volumes are seen with mild bibasilar atelectasis versus developing infiltrates. No evidence of pleural effusion. Heart size is within normal limits. IMPRESSION: Right arm PICC line tip overlies the  proximal right atrium. Low lung volumes with development of mild bibasilar atelectasis versus infiltrates. Electronically Signed   By: Earle Gell M.D.   On: 01/23/2015 21:12     ASSESSMENT AND PLAN:  82 year old male with past medical history of diabetes, hypertension, diabetic neuropathy, hyperlipidemia, history of arthritis of presents to the hospital due to falls and also noted to have a right foot cellulitis and diabetic foot ulcer.  1 Right foot cellulitis with diabetic foot ulcer/abscess:  Continue IV antibiotics with vancomycin and Zosyn. Patient now has PICC line placed and will need 2-4 weeks of IV antibiotics. Wound cultures are pending. Blood cultures are negative to date.yesterday during bedside I and D. Patient's x-rays negative for any evidence of osteomyelitis. Appreciate podiatry and ID consultation. Vascular surgery consultation placed to evaluate for peripheral arterial disease.  2 Sepsis, present on admission:patient presented with tachycardia and leukocytosis on admission Continue IV antibiotics as mentioned above.   3 leukocytosis: This is secondary to cellulitis with diabetic foot ulcer.  4 diabetes type 2 without complication: Continue sliding scale insulin and ADA diet. Metformin is on hold.  5 Depression - cont. Cymbalta.   6 Hyperlipidemia - cont. Simvastatin.   7. Slurred speech: Patient's family was stating the patient had slurred speech. CT head was negative as well as MRI. It does not appear on examination that patient has slurred speech and he has no other neurological deficits.  8. New onset atrial fibrillation: Patient does not appear to be in atrial fibrillation at this time. Cartilage is seen and evaluated patient. They're recommending ELIQUIS This will  be started after Vascular surgery consultation   9. 9. Meningioma: Patient reports that he has known about this meningioma. Neurology consultation for slurred speech and  meningioma.      Management plans discussed with the patient and he is in agreement.  CODE STATUS: FULL  TOTAL TIME TAKING CARE OF THIS PATIENT: 28 minutes.     POSSIBLE D/C 2-3 days, DEPENDING ON CLINICAL CONDITION.   Josepha Barbier M.D on 01/24/2015 at 8:52 AM  Between 7am to 6pm - Pager - [phone] After 6pm go to [email or web address] - password [email or web address] [facility]  Office  [phone]  CC: Primary care physician; [name], MD  Note: This dictation was prepared with Dragon dictation along with smaller phrase technology. Any transcriptional errors that result from this process are unintentional.

## 2015-01-25 ENCOUNTER — Inpatient Hospital Stay: Payer: Medicare Other

## 2015-01-25 DIAGNOSIS — R471 Dysarthria and anarthria: Secondary | ICD-10-CM

## 2015-01-25 LAB — BASIC METABOLIC PANEL
Anion gap: 8 (ref 5–15)
BUN: 19 mg/dL (ref 6–20)
CHLORIDE: 106 mmol/L (ref 101–111)
CO2: 25 mmol/L (ref 22–32)
CREATININE: 0.87 mg/dL (ref 0.61–1.24)
Calcium: 7.6 mg/dL — ABNORMAL LOW (ref 8.9–10.3)
GFR calc non Af Amer: >60 mL/min (ref >60)
GLUCOSE: 155 mg/dL — AB (ref 65–99)
Potassium: 3.5 mmol/L (ref 3.5–5.1)
SODIUM: 139 mmol/L (ref 135–145)

## 2015-01-25 LAB — APTT
APTT: 58 s — AB (ref 24–36)
APTT: 59 s — AB (ref 24–36)
aPTT: 58 seconds — ABNORMAL HIGH (ref 24–36)

## 2015-01-25 LAB — CBC
HEMATOCRIT: 32.4 % — AB (ref 40.0–52.0)
Hemoglobin: 10 g/dL — ABNORMAL LOW (ref 13.0–18.0)
MCH: 22 pg — ABNORMAL LOW (ref 26.0–34.0)
MCHC: 30.8 g/dL — AB (ref 32.0–36.0)
MCV: 71.6 fL — ABNORMAL LOW (ref 80.0–100.0)
Platelets: 356 10*3/uL (ref 150–440)
RBC: 4.53 MIL/uL (ref 4.40–5.90)
RDW: 17.7 % — AB (ref 11.5–14.5)
WBC: 20.2 10*3/uL — AB (ref 3.8–10.6)

## 2015-01-25 LAB — GLUCOSE, CAPILLARY
GLUCOSE-CAPILLARY: 105 mg/dL — AB (ref 65–99)
Glucose-Capillary: 134 mg/dL — ABNORMAL HIGH (ref 65–99)
Glucose-Capillary: 207 mg/dL — ABNORMAL HIGH (ref 65–99)
Glucose-Capillary: 162 mg/dL — ABNORMAL HIGH (ref 65–99)

## 2015-01-25 LAB — HEPARIN LEVEL (UNFRACTIONATED)
HEPARIN UNFRACTIONATED: 0.68 [IU]/mL (ref 0.30–0.70)
HEPARIN UNFRACTIONATED: 0.37 [IU]/mL (ref 0.30–0.70)
Heparin Unfractionated: 0.53 IU/mL (ref 0.30–0.70)

## 2015-01-25 NOTE — Progress Notes (Signed)
ANTICOAGULATION CONSULT NOTE - Follow Up Consult  Pharmacy Consult for heparin Indication: atrial fibrillation  No Known Allergies  Patient Measurements: Height: 5\' 6"  (167.6 cm) Weight: 161 lb (73.029 kg) IBW/kg (Calculated) : 63.8  Vital Signs: Temp: 100.1 F (37.8 C) (01/15 1121) Temp Source: Oral (01/15 1121) BP: 128/60 mmHg (01/15 1121) Pulse Rate: 83 (01/15 1121)  Labs:  Recent Labs  01/23/15 0523 01/24/15 0329 01/24/15 2301 01/25/15 0549 01/25/15 1252  HGB  --  10.0*  --  10.0*  --   HCT  --  32.8*  --  32.4*  --   PLT  --  338  --  356  --   APTT  --   --  43* 58* 58*  LABPROT  --   --  19.0*  --   --   INR  --   --  1.59  --   --   HEPARINUNFRC  --   --  0.94* 0.68 0.53  CREATININE 1.08 0.81  --  0.87  --     Estimated Creatinine Clearance: 45.8 mL/min (by C-G formula based on Cr of 0.87).   Medical History: Past Medical History  Diagnosis Date  . Diabetes mellitus without complication ([place])   . Hypertension   . Arthritis   . High cholesterol     Medications:  Scheduled:  . aspirin EC  81 mg Oral Daily  . docusate sodium  100 mg Oral Daily  . DULoxetine  20 mg Oral Daily  . feeding supplement (ENSURE ENLIVE)  237 mL Oral TID WC  . furosemide  40 mg Oral Daily  . insulin aspart  0-5 Units Subcutaneous QHS  . insulin aspart  0-9 Units Subcutaneous TID WC  . piperacillin-tazobactam (ZOSYN)  IV  3.375 g Intravenous 3 times per day  . simvastatin  20 mg Oral Daily    Assessment: Pharmacy consulted to start heparin in a 82 yo male with atrial fibrillation due to angiogram scheduled in the next couple days.  Patient received last dose of Lovenox 40 mg subq on 1/14 at ~0100 and last dose of apixaban 5mg  at 1051 on 1/14.    SCr: 0.81, Wt: 73 kg, Hgb: 10 plt 338 INR 1.59  aPTT 43  Anti-Xa 0.94  Goal of Therapy:  Monitor SCr and CBC per anticoagulation policy   Plan:  Discontinued Lovenox 40 mg subq daily. Pt received one dose of apixaban 5mg  at  1051 on 1/14.   Will start heparin drip 12 hours later with baseline aPTT, INR, and heparin level drawn prior to initiation.   Initial rate 900 units/hr. No bolus due to Xa still elevated due to Eliquis dose. First aPTT and anti-Xa 6 hours after start of infusion.  1/15 AM aPTT 58, heparin level 0.68. Increase to 1000 units/hr and recheck aPTT and heparin level in 6 hours.  1/15 aPTT at 1252=58. Heparin level=0.53. Will increase to 1100 units/hr. Recheck aPTT/HL in 6 hrs.   When HL and aPTT correlate switch to HL.   Pharmacy will continue to follow.   Edom Schmuhl A 01/25/2015,2:09 PM

## 2015-01-25 NOTE — Anesthesia Postprocedure Evaluation (Signed)
Anesthesia Post Note  Patient: Spencer [name]  Procedure(s) Performed: Procedure(s) (LRB): IRRIGATION AND DEBRIDEMENT FOOT (Right)  Patient location during evaluation: PACU Anesthesia Type: General Level of consciousness: awake and alert Pain management: pain level controlled Vital Signs Assessment: post-procedure vital signs reviewed and stable Respiratory status: spontaneous breathing, nonlabored ventilation, respiratory function stable and patient connected to nasal cannula oxygen Cardiovascular status: blood pressure returned to baseline and stable Postop Assessment: no signs of nausea or vomiting Anesthetic complications: no    Last Vitals:  Filed Vitals:   01/25/15 1121 01/25/15 1528  BP: 128/60 142/56  Pulse: 83 86  Temp: 37.8 C 38.1 C  Resp: 18 18    Last Pain:  Filed Vitals:   01/25/15 1528  PainSc: 0-No pain                 Molli Barrows

## 2015-01-25 NOTE — Progress Notes (Signed)
[facility] NAME: Spencer [name]    MR#:  [id number]  DATE OF BIRTH:  10/31/1917  SUBJECTIVE:   Patient voices no complaints. It appears the patient had a temperature of 100.4 this a.m. REVIEW OF SYSTEMS:    Review of Systems  Constitutional: Positive for fever. Negative for chills and malaise/fatigue.  HENT: Negative for sore throat.   Eyes: Negative for blurred vision.  Respiratory: Negative for cough, hemoptysis, shortness of breath and wheezing.   Cardiovascular: Negative for chest pain, palpitations and leg swelling.  Gastrointestinal: Negative for nausea, vomiting, abdominal pain, diarrhea and blood in stool.  Genitourinary: Negative for dysuria.  Musculoskeletal: Negative for back pain.  Skin:       RLE cellulitis wrapped  Neurological: Negative for dizziness, tremors and headaches.  Endo/Heme/Allergies: Does not bruise/bleed easily.    Tolerating Diet:yes      DRUG ALLERGIES:  No Known Allergies  VITALS:  Blood pressure 127/84, pulse 82, temperature 100.4 F (38 C), temperature source Oral, resp. rate 18, height 5\' 6"  (1.676 m), weight 73.029 kg (161 lb), SpO2 91 %.  PHYSICAL EXAMINATION:   Physical Exam  Constitutional: He is oriented to person, place, and time and well-developed, well-nourished, and in no distress. No distress.  HENT:  Head: Normocephalic.  Eyes: No scleral icterus.  Neck: Normal range of motion. Neck supple. No JVD present. No tracheal deviation present.  Cardiovascular: Normal rate, regular rhythm and normal heart sounds.  Exam reveals no gallop and no friction rub.   No murmur heard. Pulmonary/Chest: Effort normal and breath sounds normal. No respiratory distress. He has no wheezes. He has no rales. He exhibits no tenderness.  Abdominal: Soft. Bowel sounds are normal. He exhibits no distension and no mass. There is no tenderness. There is no rebound and no guarding.   Musculoskeletal: Normal range of motion. He exhibits no edema.  Neurological: He is alert and oriented to person, place, and time.  Skin: Skin is warm. No rash noted. No erythema.  His foot is wrapped  Psychiatric: Affect and judgment normal.      LABORATORY PANEL:   CBC  Recent Labs Lab 01/25/15 0549  WBC 20.2*  HGB 10.0*  HCT 32.4*  PLT 356   ------------------------------------------------------------------------------------------------------------------  Chemistries   Recent Labs Lab 01/24/15 0329 01/25/15 0549  NA 140 139  K 3.7 3.5  CL 109 106  CO2 25 25  GLUCOSE 136* 155*  BUN 21* 19  CREATININE 0.81 0.87  CALCIUM 7.7* 7.6*  MG 2.3  --    ------------------------------------------------------------------------------------------------------------------  Cardiac Enzymes No results for input(s): TROPONINI in the last 168 hours. ------------------------------------------------------------------------------------------------------------------  RADIOLOGY:  Ct Head Wo Contrast  01/23/2015  CLINICAL DATA:  Slurred speech and decreased cognitive function recently. EXAM: CT HEAD WITHOUT CONTRAST TECHNIQUE: Contiguous axial images were obtained from the base of the skull through the vertex without intravenous contrast. COMPARISON:  None currently available FINDINGS: Skull and Sinuses:Negative for fracture or destructive process. The visualized mastoids, middle ears, and imaged paranasal sinuses are clear. Visualized orbits: Left cataract resection. Brain: No evidence of acute infarction, hemorrhage, hydrocephalus, or swelling. No extra-axial collection. There is moderate generalized atrophy with commensurate ventriculomegaly. Hazy low-density in the cerebral white matter consistent with chronic small vessel disease, with superimposed small vessel infarcts in the inferior right occipital cortex and bifrontal white matter. Lobulated soft tissue density at the upper anterior  falx measuring up to 16 mm (including the superior  sagittal sinus). Neighboring calcification could be dural ossification or lesional. There is no brain contact. No associated bone changes. This usually reflects meningioma in a patient without malignancy history. IMPRESSION: 1. No acute finding. 2. Moderate atrophy and chronic small vessel disease for age. 3. 13 mm mass at the anterior falx, usually reflecting meningioma. No associated mass effect. Electronically Signed   By: Monte Fantasia M.D.   On: 01/23/2015 12:19   Mr Brain Wo Contrast  01/23/2015  CLINICAL DATA:  Multiple falls, slurred speech, decreased cognitive function. EXAM: MRI HEAD WITHOUT CONTRAST TECHNIQUE: Multiplanar, multiecho pulse sequences of the brain and surrounding structures were obtained without intravenous contrast. COMPARISON:  CT head earlier in the day. FINDINGS: No evidence for acute stroke, acute hemorrhage, or extra-axial fluid. Marked ventricular enlargement, which on first glance, has features suggestive of normal pressure hydrocephalus, with markedly dilated atria of the lateral ventricles, prominent temporal horns, and periventricular white matter signal abnormality. Given the cortical atrophy, other white matter lesions suggestive of chronic microvascular ischemic change, and chronic lacunar infarctions present, ventricular dilatation is favored to represent central atrophy and hydrocephalus ex vacuo rather than normal pressure hydrocephalus. No midline abnormality. Cervical spondylosis is suspected, with disc space narrowing and cord displacement at C4-C5. Flow voids are maintained in the carotid, basilar, and both vertebral arteries. Noncontrast examination was performed. An anterior falcine extra-axial mass, 14 x 14 mm cross-section is redemonstrated. The mass has invaded the superior sagittal sinus but otherwise appears non worrisome. No osseous erosion. This is consistent with an incidental meningioma. LEFT cataract  extraction. No layering fluid in the sinuses. Unremarkable mastoids. Extracranial soft tissues unremarkable. IMPRESSION: Global atrophy with chronic microvascular ischemic change, moderately advanced. No acute intracranial findings. 14 x 14 mm extra-axial mass in the midline frontal falx. Despite the noncontrast exam, one can be reasonably certain that this represents a meningioma, an incidental finding. Suspected cervical spondylosis, incompletely evaluated on this motion degraded brain study. When the patient is better able to hold still, and if there are signs and symptoms of cervical spondylosis or myelopathy, consider MRI cervical spine for further evaluation on an elective basis. Electronically Signed   By: Staci Righter M.D.   On: 01/23/2015 19:15   Dg Chest Port 1 View  01/23/2015  CLINICAL DATA:  Central catheter placement EXAM: PORTABLE CHEST 1 VIEW COMPARISON:  Study obtained earlier in the day FINDINGS: Central catheter tip is in the superior vena cava. No pneumothorax. There is patchy airspace consolidation in each lung base. Lungs elsewhere clear. Heart is mildly enlarged with pulmonary vascular within normal limits. No adenopathy. There is degenerative change in each shoulder with superior migration of each humeral head. IMPRESSION: Central catheter tip in superior vena cava. No pneumothorax. Patchy infiltrate in the lung bases, likely multifocal pneumonia. Lungs otherwise clear. Stable cardiac prominence. Probable chronic rotator cuff tears bilaterally. Electronically Signed   By: Lowella Grip III M.D.   On: 01/23/2015 21:31   Dg Chest Port 1 View  01/23/2015  CLINICAL DATA:  PICC line placement.  Cellulitis. EXAM: PORTABLE CHEST 1 VIEW COMPARISON:  01/21/2015 FINDINGS: New right arm PICC line is seen with tip overlying the proximal right atrium. Low lung volumes are seen with mild bibasilar atelectasis versus developing infiltrates. No evidence of pleural effusion. Heart size is within  normal limits. IMPRESSION: Right arm PICC line tip overlies the proximal right atrium. Low lung volumes with development of mild bibasilar atelectasis versus infiltrates. Electronically Signed   By: Jenny Reichmann  Kris Hartmann M.D.   On: 01/23/2015 21:12     ASSESSMENT AND PLAN:   81 year old male with past medical history of diabetes, hypertension, diabetic neuropathy, hyperlipidemia, history of arthritis of presents to the hospital due to falls and also noted to have a right foot cellulitis and diabetic foot ulcer.  1 Right foot cellulitis with diabetic foot ulcer/abscess: Wound culture is growing MSSA. Patient is currently on Zosyn and vancomycin. I will discontinue vancomycin. I will continue with Zosyn due to the extent of his abscess. ID is also following the patient and we'll assist with antibiotic coverage. Patient's x-rays negative for any evidence of osteomyelitis. Appreciate podiatry, vascular and ID consultation. Plan for angiogram either tomorrow or Tuesday.   2 Sepsis, present on admission:patient presented with tachycardia and leukocytosis on admission Continue IV antibiotics as mentioned above.   3 leukocytosis: This is secondary to cellulitis with diabetic foot ulcer.  4 diabetes type 2 without complication: Continue sliding scale insulin and ADA diet. Metformin is on hold.  5 Depression - cont. Cymbalta.   6 Hyperlipidemia - cont. Simvastatin.   7. Slurred speech: Patient's family was stating the patient had slurred speech. CT head was negative as well as MRI. It does not appear on examination that patient has slurred speech and he has no other neurological deficits.  8. New onset atrial fibrillation: Patient does not appear to be in atrial fibrillation at this time. Cardiology has recommended using anticoagulation. Patient is planned for angiogram this week and therefore is currently on heparin drip. They're recommending ELIQUIS  Plan was discussed with after surgery.   9. .  Meningioma: Patient reports that he has known about this meningioma. Neurology consultation for slurred speech and meningioma. MRI of brain was negative for stroke.  10. Fever: I will order chest x-ray to evaluate for atelectasis. If patient has another fever then we'll order blood cultures.     Management plans discussed with the patient and he is in agreement.  CODE STATUS: FULL  TOTAL TIME TAKING CARE OF THIS PATIENT: 28 minutes.  Physical therapy is recommending skilled nursing facility.   POSSIBLE D/C 4-6 days, DEPENDING ON CLINICAL CONDITION.   Jarissa Sheriff M.D on 01/25/2015 at 9:33 AM  Between 7am to 6pm - Pager - [phone] After 6pm go to [email or web address] - password [email or web address] [facility]  Office  [phone]  CC: Primary care physician; [name], MD  Note: This dictation was prepared with Dragon dictation along with smaller phrase technology. Any transcriptional errors that result from this process are unintentional.

## 2015-01-25 NOTE — Progress Notes (Addendum)
ANTICOAGULATION CONSULT NOTE - Follow Up Consult  Pharmacy Consult for heparin Indication: atrial fibrillation  No Known Allergies  Patient Measurements: Height: 5\' 6"  (167.6 cm) Weight: 161 lb (73.029 kg) IBW/kg (Calculated) : 63.8  Vital Signs: Temp: 98.3 F (36.8 C) (01/15 1947) Temp Source: Oral (01/15 1947) BP: 153/83 mmHg (01/15 1947) Pulse Rate: 79 (01/15 1947)  Labs:  Recent Labs  01/23/15 0523 01/24/15 0329  01/24/15 2301 01/25/15 0549 01/25/15 1252 01/25/15 2005  HGB  --  10.0*  --   --  10.0*  --   --   HCT  --  32.8*  --   --  32.4*  --   --   PLT  --  338  --   --  356  --   --   APTT  --   --   < > 43* 58* 58* 59*  LABPROT  --   --   --  19.0*  --   --   --   INR  --   --   --  1.59  --   --   --   HEPARINUNFRC  --   --   < > 0.94* 0.68 0.53 0.37  CREATININE 1.08 0.81  --   --  0.87  --   --   < > = values in this interval not displayed.  Estimated Creatinine Clearance: 45.8 mL/min (by C-G formula based on Cr of 0.87).   Medical History: Past Medical History  Diagnosis Date  . Diabetes mellitus without complication ([place])   . Hypertension   . Arthritis   . High cholesterol     Medications:  Scheduled:  . aspirin EC  81 mg Oral Daily  . docusate sodium  100 mg Oral Daily  . DULoxetine  20 mg Oral Daily  . feeding supplement (ENSURE ENLIVE)  237 mL Oral TID WC  . furosemide  40 mg Oral Daily  . insulin aspart  0-5 Units Subcutaneous QHS  . insulin aspart  0-9 Units Subcutaneous TID WC  . piperacillin-tazobactam (ZOSYN)  IV  3.375 g Intravenous 3 times per day  . simvastatin  20 mg Oral Daily    Assessment: Pharmacy consulted to start heparin in a 82 yo male with atrial fibrillation due to angiogram scheduled in the next couple days.  Patient received last dose of Lovenox 40 mg subq on 1/14 at ~0100 and last dose of apixaban 5mg  at 1051 on 1/14.    SCr: 0.81, Wt: 73 kg, Hgb: 10 plt 338 INR 1.59  aPTT 43  Anti-Xa 0.94  Goal of Therapy:   Monitor SCr and CBC per anticoagulation policy   Plan:  Discontinued Lovenox 40 mg subq daily. Pt received one dose of apixaban 5mg  at 1051 on 1/14.   Will start heparin drip 12 hours later with baseline aPTT, INR, and heparin level drawn prior to initiation.   Initial rate 900 units/hr. No bolus due to Xa still elevated due to Eliquis dose. First aPTT and anti-Xa 6 hours after start of infusion.  1/15 AM aPTT 58, heparin level 0.68. Increase to 1000 units/hr and recheck aPTT and heparin level in 6 hours.  1/15 aPTT at 1252=58. Heparin level=0.53. Will increase to 1100 units/hr. Recheck aPTT/HL in 6 hrs.   When HL and aPTT correlate switch to HL.  1/15 20:00 anti-Xa 0.37, aPTT 59. Increase to 1200 units/hr and recheck aPTT and heparin level in 6 hours.  1/16 04:00 anti-Xa 0.39, aPTT 74.  Will continue to follow and adjust by heparin level only. Recheck in 6 hours to confirm.  Pharmacy will continue to follow.   Alianna Wurster S 01/25/2015,9:40 PM

## 2015-01-25 NOTE — Progress Notes (Signed)
Daily Progress Note   Subjective  - 2 Days Post-Op  F.u right foot.    Objective Filed Vitals:   01/24/15 1931 01/24/15 2347 01/25/15 0458 01/25/15 0744  BP: 138/66 143/59 153/60 127/84  Pulse: 79 80 84 82  Temp: 98.4 F (36.9 C) 98.8 F (37.1 C) 98.6 F (37 C) 100.4 F (38 C)  TempSrc: Oral Oral Oral Oral  Resp: 18 20 20 18   Height:      Weight:      SpO2: 93% 90% 90% 91%    Physical Exam: Wound with continued necrosis to medial 1st mtpj and along incision site. Scant purulence from proximal portion of incision. Erythema is somewhat improved to dorsal foot.  Laboratory CBC    Component Value Date/Time   WBC 20.2* 01/25/2015 0549   HGB 10.0* 01/25/2015 0549   HCT 32.4* 01/25/2015 0549   PLT 356 01/25/2015 0549    BMET    Component Value Date/Time   NA 139 01/25/2015 0549   K 3.5 01/25/2015 0549   CL 106 01/25/2015 0549   CO2 25 01/25/2015 0549   GLUCOSE 155* 01/25/2015 0549   BUN 19 01/25/2015 0549   CREATININE 0.87 01/25/2015 0549   CALCIUM 7.6* 01/25/2015 0549   GFRNONAA >60 01/25/2015 0549   GFRAA >60 01/25/2015 0549    Assessment/Planning:    Planning for revascularization early next week.  May likely need re-i & D after re-vasc.  WBC remains elevated.  Cx pan sensitive staph and on Zosyn   Samara Deist A  01/25/2015, 10:51 AM

## 2015-01-25 NOTE — Consult Note (Signed)
CC: slurry speech   HPI: Spencer [name] is an 82 y.o. male  male with past medical history of diabetes, hypertension, diabetic neuropathy, hyperlipidemia, history of arthritis of presents to the hospital due to falls and also noted to have a right foot cellulitis and diabetic foot ulcer. Found to be septic on antibiotics Neurology consulted for dysarthria which comes and goes as per family.    Past Medical History  Diagnosis Date  . Diabetes mellitus without complication ([place])   . Hypertension   . Arthritis   . High cholesterol     Past Surgical History  Procedure Laterality Date  . Appendectomy    . Irrigation and debridement foot Right 01/23/2015    Procedure: IRRIGATION AND DEBRIDEMENT FOOT;  Surgeon: Samara Deist, DPM;  Location: [facility] ORS;  Service: Podiatry;  Laterality: Right;    Family History  Problem Relation Age of Onset  . Prostate cancer Father     Social History:  reports that he has quit smoking. He has never used smokeless tobacco. He reports that he does not drink alcohol or use illicit drugs.  No Known Allergies  Medications: I have reviewed the patient's current medications.  ROS: Unable to obtain due to confusion   Physical Examination: Blood pressure 127/84, pulse 82, temperature 100.4 F (38 C), temperature source Oral, resp. rate 18, height 5\' 6"  (1.676 m), weight 161 lb (73.029 kg), SpO2 91 %.   Neurological Examination Mental Status: Alert, hard of hearing. Cranial Nerves: II: Discs flat bilaterally; Visual fields grossly normal, pupils equal, round, reactive to light and accommodation III,IV, VI: ptosis not present, extra-ocular motions intact bilaterally V,VII: smile symmetric, facial light touch sensation normal bilaterally VIII: hearing normal bilaterally IX,X: gag reflex present XI: bilateral shoulder shrug XII: midline tongue extension Motor: Right : Upper extremity   4+/5    Left:     Upper extremity   4+/5  Lower extremity    4/5     Lower extremity   4/5 Tone and bulk:normal tone throughout; no atrophy noted Sensory: Pinprick and light touch intact throughout, bilaterally Deep Tendon Reflexes: 1+ and symmetric throughout Plantars: Right: downgoing   Left: downgoing Cerebellar: normal finger-to-nose, normal rapid alternating movements and normal heel-to-shin test Gait: not tested       Laboratory Studies:   Basic Metabolic Panel:  Recent Labs Lab 01/21/15 1700 01/22/15 0452 01/23/15 0523 01/23/15 1620 01/24/15 0329 01/25/15 0549  NA 138 138  --   --  140 139  K 4.2 4.0  --   --  3.7 3.5  CL 104 109  --   --  109 106  CO2 21* 22  --   --  25 25  GLUCOSE 159* 128*  --   --  136* 155*  BUN 23* 19  --   --  21* 19  CREATININE 0.94 0.87 1.08  --  0.81 0.87  CALCIUM 8.6* 7.9*  --   --  7.7* 7.6*  MG  --   --   --   --  2.3  --   PHOS  --   --   --  2.3*  --   --     Liver Function Tests: No results for input(s): AST, ALT, ALKPHOS, BILITOT, PROT, ALBUMIN in the last 168 hours. No results for input(s): LIPASE, AMYLASE in the last 168 hours. No results for input(s): AMMONIA in the last 168 hours.  CBC:  Recent Labs Lab 01/21/15 1700 01/22/15 0452 01/24/15 0329  01/25/15 0549  WBC 30.4* 22.9* 22.6* 20.2*  NEUTROABS 28.0*  --   --   --   HGB 10.9* 9.6* 10.0* 10.0*  HCT 35.8* 31.4* 32.8* 32.4*  MCV 74.3* 73.2* 72.8* 71.6*  PLT 308 299 338 356    Cardiac Enzymes: No results for input(s): CKTOTAL, CKMB, CKMBINDEX, TROPONINI in the last 168 hours.  BNP: Invalid input(s): POCBNP  CBG:  Recent Labs Lab 01/24/15 0729 01/24/15 1119 01/24/15 1556 01/24/15 2126 01/25/15 0746  GLUCAP 147* 212* 226* 175* 134*    Microbiology: Results for orders placed or performed during the hospital encounter of 01/21/15  Culture, blood (routine x 2)     Status: None (Preliminary result)   Collection Time: 01/21/15  6:48 PM  Result Value Ref Range Status   Specimen Description BLOOD RIGHT WRIST   Final   Special Requests BAA,ANA,AER,6ML  Final   Culture NO GROWTH 3 DAYS  Final   Report Status PENDING  Incomplete  Culture, blood (routine x 2)     Status: None (Preliminary result)   Collection Time: 01/21/15  6:48 PM  Result Value Ref Range Status   Specimen Description BLOOD LEFT ARM  Final   Special Requests BAA,ANA,AER,7ML  Final   Culture NO GROWTH 3 DAYS  Final   Report Status PENDING  Incomplete  Wound culture     Status: None (Preliminary result)   Collection Time: 01/22/15  5:50 PM  Result Value Ref Range Status   Specimen Description ABSCESS  Final   Special Requests Immunocompromised  Final   Gram Stain RARE WBC SEEN FEW GRAM POSITIVE COCCI   Final   Culture   Final    RARE GROWTH STAPHYLOCOCCUS AUREUS SUSCEPTIBILITIES TO FOLLOW    Report Status PENDING  Incomplete  Culture, routine-abscess     Status: None (Preliminary result)   Collection Time: 01/23/15 10:32 AM  Result Value Ref Range Status   Specimen Description WOUND  Final   Special Requests NONE  Final   Gram Stain PENDING  Incomplete   Culture LIGHT GROWTH STAPHYLOCOCCUS AUREUS  Final   Report Status PENDING  Incomplete   Organism ID, Bacteria STAPHYLOCOCCUS AUREUS  Final      Susceptibility   Staphylococcus aureus - MIC*    CIPROFLOXACIN <=0.5 SENSITIVE Sensitive     ERYTHROMYCIN <=0.25 SENSITIVE Sensitive     GENTAMICIN <=0.5 SENSITIVE Sensitive     OXACILLIN <=0.25 SENSITIVE Sensitive     TRIMETH/SULFA <=10 SENSITIVE Sensitive     CLINDAMYCIN <=0.25 SENSITIVE Sensitive     CEFOXITIN SCREEN NEGATIVE Sensitive     Inducible Clindamycin NEGATIVE Sensitive     TETRACYCLINE Value in next row Sensitive      SENSITIVE<=1    RIFAMPIN Value in next row Sensitive      SENSITIVE<=0.5    VANCOMYCIN Value in next row Sensitive      SENSITIVE1    * LIGHT GROWTH STAPHYLOCOCCUS AUREUS    Coagulation Studies:  Recent Labs  01/24/15 2301  LABPROT 19.0*  INR 1.59    Urinalysis:  Recent Labs Lab  01/21/15 1949  COLORURINE AMBER*  LABSPEC 1.024  PHURINE 5.0  GLUCOSEU NEGATIVE  HGBUR 1+*  BILIRUBINUR NEGATIVE  KETONESUR TRACE*  PROTEINUR 100*  NITRITE NEGATIVE  LEUKOCYTESUR NEGATIVE    Lipid Panel:  No results found for: CHOL, TRIG, HDL, CHOLHDL, VLDL, LDLCALC  HgbA1C: No results found for: HGBA1C  Urine Drug Screen:  No results found for: LABOPIA, COCAINSCRNUR, LABBENZ, AMPHETMU, THCU, LABBARB  Alcohol Level: No results for input(s): ETH in the last 168 hours.   Imaging: Ct Head Wo Contrast  01/23/2015  CLINICAL DATA:  Slurred speech and decreased cognitive function recently. EXAM: CT HEAD WITHOUT CONTRAST TECHNIQUE: Contiguous axial images were obtained from the base of the skull through the vertex without intravenous contrast. COMPARISON:  None currently available FINDINGS: Skull and Sinuses:Negative for fracture or destructive process. The visualized mastoids, middle ears, and imaged paranasal sinuses are clear. Visualized orbits: Left cataract resection. Brain: No evidence of acute infarction, hemorrhage, hydrocephalus, or swelling. No extra-axial collection. There is moderate generalized atrophy with commensurate ventriculomegaly. Hazy low-density in the cerebral white matter consistent with chronic small vessel disease, with superimposed small vessel infarcts in the inferior right occipital cortex and bifrontal white matter. Lobulated soft tissue density at the upper anterior falx measuring up to 16 mm (including the superior sagittal sinus). Neighboring calcification could be dural ossification or lesional. There is no brain contact. No associated bone changes. This usually reflects meningioma in a patient without malignancy history. IMPRESSION: 1. No acute finding. 2. Moderate atrophy and chronic small vessel disease for age. 3. 13 mm mass at the anterior falx, usually reflecting meningioma. No associated mass effect. Electronically Signed   By: Monte Fantasia M.D.   On:  01/23/2015 12:19   Mr Brain Wo Contrast  01/23/2015  CLINICAL DATA:  Multiple falls, slurred speech, decreased cognitive function. EXAM: MRI HEAD WITHOUT CONTRAST TECHNIQUE: Multiplanar, multiecho pulse sequences of the brain and surrounding structures were obtained without intravenous contrast. COMPARISON:  CT head earlier in the day. FINDINGS: No evidence for acute stroke, acute hemorrhage, or extra-axial fluid. Marked ventricular enlargement, which on first glance, has features suggestive of normal pressure hydrocephalus, with markedly dilated atria of the lateral ventricles, prominent temporal horns, and periventricular white matter signal abnormality. Given the cortical atrophy, other white matter lesions suggestive of chronic microvascular ischemic change, and chronic lacunar infarctions present, ventricular dilatation is favored to represent central atrophy and hydrocephalus ex vacuo rather than normal pressure hydrocephalus. No midline abnormality. Cervical spondylosis is suspected, with disc space narrowing and cord displacement at C4-C5. Flow voids are maintained in the carotid, basilar, and both vertebral arteries. Noncontrast examination was performed. An anterior falcine extra-axial mass, 14 x 14 mm cross-section is redemonstrated. The mass has invaded the superior sagittal sinus but otherwise appears non worrisome. No osseous erosion. This is consistent with an incidental meningioma. LEFT cataract extraction. No layering fluid in the sinuses. Unremarkable mastoids. Extracranial soft tissues unremarkable. IMPRESSION: Global atrophy with chronic microvascular ischemic change, moderately advanced. No acute intracranial findings. 14 x 14 mm extra-axial mass in the midline frontal falx. Despite the noncontrast exam, one can be reasonably certain that this represents a meningioma, an incidental finding. Suspected cervical spondylosis, incompletely evaluated on this motion degraded brain study. When the  patient is better able to hold still, and if there are signs and symptoms of cervical spondylosis or myelopathy, consider MRI cervical spine for further evaluation on an elective basis. Electronically Signed   By: Staci Righter M.D.   On: 01/23/2015 19:15   Dg Chest Port 1 View  01/23/2015  CLINICAL DATA:  Central catheter placement EXAM: PORTABLE CHEST 1 VIEW COMPARISON:  Study obtained earlier in the day FINDINGS: Central catheter tip is in the superior vena cava. No pneumothorax. There is patchy airspace consolidation in each lung base. Lungs elsewhere clear. Heart is mildly enlarged with pulmonary vascular within normal limits. No adenopathy. There is  degenerative change in each shoulder with superior migration of each humeral head. IMPRESSION: Central catheter tip in superior vena cava. No pneumothorax. Patchy infiltrate in the lung bases, likely multifocal pneumonia. Lungs otherwise clear. Stable cardiac prominence. Probable chronic rotator cuff tears bilaterally. Electronically Signed   By: Lowella Grip III M.D.   On: 01/23/2015 21:31   Dg Chest Port 1 View  01/23/2015  CLINICAL DATA:  PICC line placement.  Cellulitis. EXAM: PORTABLE CHEST 1 VIEW COMPARISON:  01/21/2015 FINDINGS: New right arm PICC line is seen with tip overlying the proximal right atrium. Low lung volumes are seen with mild bibasilar atelectasis versus developing infiltrates. No evidence of pleural effusion. Heart size is within normal limits. IMPRESSION: Right arm PICC line tip overlies the proximal right atrium. Low lung volumes with development of mild bibasilar atelectasis versus infiltrates. Electronically Signed   By: Earle Gell M.D.   On: 01/23/2015 21:12     Assessment/Plan:  82 y.o. male  male with past medical history of diabetes, hypertension, diabetic neuropathy, hyperlipidemia, history of arthritis of presents to the hospital due to falls and also noted to have a right foot cellulitis and diabetic foot ulcer.  Found to be septic on antibiotics Neurology consulted for dysarthria which comes and goes as per family.    para falcine meningioma which is incidental finding not contributing to current mental status  Likely delirium in setting of infection Blinds open during the day No further testing from neurological stand point S/p discussion with family at bedside Leotis Pain  01/25/2015, 10:59 AM

## 2015-01-26 LAB — CBC
HCT: 31.8 % — ABNORMAL LOW (ref 40.0–52.0)
Hemoglobin: 9.7 g/dL — ABNORMAL LOW (ref 13.0–18.0)
MCH: 21.8 pg — ABNORMAL LOW (ref 26.0–34.0)
MCHC: 30.5 g/dL — ABNORMAL LOW (ref 32.0–36.0)
MCV: 71.6 fL — AB (ref 80.0–100.0)
PLATELETS: 357 10*3/uL (ref 150–440)
RBC: 4.44 MIL/uL (ref 4.40–5.90)
RDW: 18 % — ABNORMAL HIGH (ref 11.5–14.5)
WBC: 17.2 10*3/uL — ABNORMAL HIGH (ref 3.8–10.6)

## 2015-01-26 LAB — GLUCOSE, CAPILLARY
GLUCOSE-CAPILLARY: 112 mg/dL — AB (ref 65–99)
GLUCOSE-CAPILLARY: 201 mg/dL — AB (ref 65–99)
GLUCOSE-CAPILLARY: 188 mg/dL — AB (ref 65–99)
Glucose-Capillary: 169 mg/dL — ABNORMAL HIGH (ref 65–99)

## 2015-01-26 LAB — CULTURE, ROUTINE-ABSCESS

## 2015-01-26 LAB — BASIC METABOLIC PANEL
ANION GAP: 7 (ref 5–15)
BUN: 18 mg/dL (ref 6–20)
CALCIUM: 7.5 mg/dL — AB (ref 8.9–10.3)
CO2: 28 mmol/L (ref 22–32)
Chloride: 103 mmol/L (ref 101–111)
Creatinine, Ser: 0.87 mg/dL (ref 0.61–1.24)
Glucose, Bld: 117 mg/dL — ABNORMAL HIGH (ref 65–99)
POTASSIUM: 3.5 mmol/L (ref 3.5–5.1)
SODIUM: 138 mmol/L (ref 135–145)

## 2015-01-26 LAB — WOUND CULTURE

## 2015-01-26 LAB — APTT: APTT: 74 s — AB (ref 24–36)

## 2015-01-26 LAB — HEPARIN LEVEL (UNFRACTIONATED)
HEPARIN UNFRACTIONATED: 0.39 [IU]/mL (ref 0.30–0.70)
HEPARIN UNFRACTIONATED: 0.46 [IU]/mL (ref 0.30–0.70)

## 2015-01-26 NOTE — Progress Notes (Signed)
[name] PROGRESS NOTE Date of Admission:  01/21/2015     ID: Spencer [name] is a 82 y.o. male with  Le abscess, DM and PVD Active Problems:   Cellulitis   Subjective: No fevers, seen by vascular  ROS  Eleven systems are reviewed and negative except per hpi  Medications:  Antibiotics Given (last 72 hours)    Date/Time Action Medication Dose Rate   01/24/15 0123 Given   piperacillin-tazobactam (ZOSYN) IVPB 3.375 g 3.375 g 12.5 mL/hr   01/24/15 0506 Given   vancomycin (VANCOCIN) IVPB 750 mg/150 ml premix 750 mg 150 mL/hr   01/24/15 0612 Given   piperacillin-tazobactam (ZOSYN) IVPB 3.375 g 3.375 g 12.5 mL/hr   01/24/15 1434 Given   piperacillin-tazobactam (ZOSYN) IVPB 3.375 g 3.375 g 12.5 mL/hr   01/24/15 2127 Given   piperacillin-tazobactam (ZOSYN) IVPB 3.375 g 3.375 g 12.5 mL/hr   01/25/15 0433 Given   vancomycin (VANCOCIN) IVPB 750 mg/150 ml premix 750 mg 150 mL/hr   01/25/15 0602 Given   piperacillin-tazobactam (ZOSYN) IVPB 3.375 g 3.375 g 12.5 mL/hr   01/25/15 1440 Given   piperacillin-tazobactam (ZOSYN) IVPB 3.375 g 3.375 g 12.5 mL/hr   01/25/15 2116 Given   piperacillin-tazobactam (ZOSYN) IVPB 3.375 g 3.375 g 12.5 mL/hr   01/26/15 0610 Given   piperacillin-tazobactam (ZOSYN) IVPB 3.375 g 3.375 g 12.5 mL/hr   01/26/15 1418 Given   piperacillin-tazobactam (ZOSYN) IVPB 3.375 g 3.375 g 12.5 mL/hr     . aspirin EC  81 mg Oral Daily  . docusate sodium  100 mg Oral Daily  . DULoxetine  20 mg Oral Daily  . feeding supplement (ENSURE ENLIVE)  237 mL Oral TID WC  . furosemide  40 mg Oral Daily  . insulin aspart  0-5 Units Subcutaneous QHS  . insulin aspart  0-9 Units Subcutaneous TID WC  . piperacillin-tazobactam (ZOSYN)  IV  3.375 g Intravenous 3 times per day  . simvastatin  20 mg Oral Daily    Objective: Vital signs in last 24 hours: Temp:  [97.6 F (36.4 C)-98.3 F (36.8 C)] 98.1 F (36.7 C) (01/16 1526) Pulse Rate:  [65-81] 79 (01/16  1526) Resp:  [16-22] 18 (01/16 1526) BP: (131-153)/(57-83) 131/57 mmHg (01/16 1526) SpO2:  [90 %-94 %] 94 % (01/16 1526) Constitutional: He is oriented to person, place, and time. Frail HENT: perrla  Mouth/Throat: Oropharynx is clear and dry. No oropharyngeal exudate.  Cardiovascular: Normal rate, 3/6 sm  Pulmonary/Chest: bil rhonchi  Abdominal: Soft. Bowel sounds are normal. He exhibits no distension. There is no tenderness.  Lymphadenopathy:  He has no cervical adenopathy.  Neurological: He is alert and oriented to person, place, and time.  Skin: Skin is warm and dry. No rash noted. No erythema.  Psychiatric: He has a normal mood and affect. His behavior is normal.  Ext r foot wrapped Lab Results  Recent Labs  01/25/15 0549 01/26/15 0348  WBC 20.2* 17.2*  HGB 10.0* 9.7*  HCT 32.4* 31.8*  NA 139 138  K 3.5 3.5  CL 106 103  CO2 25 28  BUN 19 18  CREATININE 0.87 0.87    Microbiology: @micro @ Studies/Results: Dg Chest 1 View  01/25/2015  CLINICAL DATA:  RIGHT foot cellulitis, diabetic ulcer, fever today EXAM: CHEST 1 VIEW COMPARISON:  Portable exam 1006 hours compared to 01/23/2015 FINDINGS: RIGHT arm PICC line with tip projecting over SVC. Enlargement of cardiac silhouette. Atherosclerotic calcification aorta. Slight pulmonary vascular congestion. Bibasilar opacities question atelectasis versus  infiltrate little changed from previous exam. Upper lungs clear. No gross pleural effusion or pneumothorax. BILATERAL glenohumeral degenerative changes and chronic rotator cuff tears. IMPRESSION: Enlargement of cardiac silhouette. Bronchitic changes with bibasilar opacities question atelectasis versus consolidation, little changed. Electronically Signed   By: Lavonia Dana M.D.   On: 01/25/2015 12:10   Dg Foot Complete Right  01/25/2015  CLINICAL DATA:  Wound with continue necrosis at medial aspect of first MTP joint along incision site, diabetic foot ulcer, hypertension, underwent  irrigation and debridement of foot on 01/23/2015 EXAM: RIGHT FOOT COMPLETE - 3+ VIEW COMPARISON:  01/21/2015 FINDINGS: Dressing artifacts at medial aspect of RIGHT foot. Osseous demineralization. Joint space narrowing of first MTP joint and IP joint great toe. Decreased soft tissue swelling versus preoperative exam at medial forefoot. Scattered foci of soft tissue gas consistent with history of surgery and ulcer. No definite acute fracture, dislocation or bone destruction. Scattered small vessel vascular calcifications. IMPRESSION: Decreased soft tissue swelling at RIGHT foot with scattered foci of soft tissue gas which may be related to known ulcer or to interval surgery. No definite acute bony abnormalities. Osseous demineralization with degenerative changes at great toe. Electronically Signed   By: Lavonia Dana M.D.   On: 01/25/2015 13:39    Assessment/Plan: Spencer [name] is a 82 y.o. male with DM (A1c 6.9) associated with PN admitted with R foot infection no s/p debridement 1/13with extensive soft tissue infection but no exposed bone. He will likely need 2-4 weeks IV therapy followed by oral tail Cx with MSSA. Wound GS showed GNR as well  Per Dr Vickki Muff - Planning for revascularization early next week.  May likely need re-i & D after re-vasc. WBC remains elevated. Cx pan sensitive staph and on Zosyn Recommendations Cont zosyn Agree with revasc and possible need for repeat debridement.  Thank you very much for the consult. Will follow with you.  [place], [place]   01/26/2015, 3:59 PM

## 2015-01-26 NOTE — Progress Notes (Signed)
Per RN in progression rounds patient is getting PICC line today and will need I.V Abx. Per MD note patient will be ready in 4-6 days. Clinical Education officer, museum (CSW) updated Cabin crew at [facility]. Per Seth Bake patient is an independent living resident at [facility] and can come to SNF side when stable. CSW attempted to meet with patient however he was asleep at bedside. CSW will continue to follow and assist as needed.   Blima Rich, LCSW [phone]

## 2015-01-26 NOTE — Progress Notes (Signed)
[facility]  Daily Progress Note   Subjective  - 3 Days Post-Op  The patient notes some pain yesterday but today he reports he has been quite comfortable  Objective Filed Vitals:   01/26/15 0540 01/26/15 0731 01/26/15 1104 01/26/15 1526  BP: 144/75 136/62 138/58 131/57  Pulse: 80 65 81 79  Temp: 97.6 F (36.4 C) 97.8 F (36.6 C) 97.6 F (36.4 C) 98.1 F (36.7 C)  TempSrc: Oral Oral Oral Oral  Resp: 22 16 18 18   Height:      Weight:      SpO2: 92% 93% 94% 94%    Intake/Output Summary (Last 24 hours) at 01/26/15 1833 Last data filed at 01/26/15 1807  Gross per 24 hour  Intake    360 ml  Output    950 ml  Net   -590 ml    PULM  Normal effort , no use of accessory muscles CV  No JVD, RRR Abd      No distended, nontender VASC  pedal pulses are nonpalpable bilaterally right foot surgical site is dressed  Laboratory CBC    Component Value Date/Time   WBC 17.2* 01/26/2015 0348   HGB 9.7* 01/26/2015 0348   HCT 31.8* 01/26/2015 0348   PLT 357 01/26/2015 0348    BMET    Component Value Date/Time   NA 138 01/26/2015 0348   K 3.5 01/26/2015 0348   CL 103 01/26/2015 0348   CO2 28 01/26/2015 0348   GLUCOSE 117* 01/26/2015 0348   BUN 18 01/26/2015 0348   CREATININE 0.87 01/26/2015 0348   CALCIUM 7.5* 01/26/2015 0348   GFRNONAA >60 01/26/2015 0348   GFRAA >60 01/26/2015 0348    Assessment/Planning: POD #3 s/p incision and drainage of the right foot   At this time he does not have palpable pulses he has significant tissue loss and therefore angiography will be performed with the hope of intervention and restoration of adequate perfusion for healing. Arrangements will be made for tomorrow    Katha Cabal  01/26/2015, 6:33 PM

## 2015-01-26 NOTE — Progress Notes (Signed)
ANTICOAGULATION CONSULT NOTE - Follow Up Consult  Pharmacy Consult for heparin Indication: atrial fibrillation  No Known Allergies  Patient Measurements: Height: 5\' 6"  (167.6 cm) Weight: 161 lb (73.029 kg) IBW/kg (Calculated) : 63.8  Vital Signs: Temp: 97.6 F (36.4 C) (01/16 1104) Temp Source: Oral (01/16 1104) BP: 138/58 mmHg (01/16 1104) Pulse Rate: 81 (01/16 1104)  Labs:  Recent Labs  01/24/15 0329  01/24/15 2301 01/25/15 0549 01/25/15 1252 01/25/15 2005 01/26/15 0348 01/26/15 0958  HGB 10.0*  --   --  10.0*  --   --  9.7*  --   HCT 32.8*  --   --  32.4*  --   --  31.8*  --   PLT 338  --   --  356  --   --  357  --   APTT  --   < > 43* 58* 58* 59* 74*  --   LABPROT  --   --  19.0*  --   --   --   --   --   INR  --   --  1.59  --   --   --   --   --   HEPARINUNFRC  --   < > 0.94* 0.68 0.53 0.37 0.39 0.46  CREATININE 0.81  --   --  0.87  --   --  0.87  --   < > = values in this interval not displayed.  Estimated Creatinine Clearance: 45.8 mL/min (by C-G formula based on Cr of 0.87).   Medical History: Past Medical History  Diagnosis Date  . Diabetes mellitus without complication ([place])   . Hypertension   . Arthritis   . High cholesterol     Medications:  Scheduled:  . aspirin EC  81 mg Oral Daily  . docusate sodium  100 mg Oral Daily  . DULoxetine  20 mg Oral Daily  . feeding supplement (ENSURE ENLIVE)  237 mL Oral TID WC  . furosemide  40 mg Oral Daily  . insulin aspart  0-5 Units Subcutaneous QHS  . insulin aspart  0-9 Units Subcutaneous TID WC  . piperacillin-tazobactam (ZOSYN)  IV  3.375 g Intravenous 3 times per day  . simvastatin  20 mg Oral Daily    Assessment: Pharmacy consulted to start heparin in a 82 yo male with atrial fibrillation due to angiogram scheduled in the next couple days.  Patient received last dose of Lovenox 40 mg subq on 1/14 at ~0100 and last dose of apixaban 5mg  at 1051 on 1/14.    SCr: 0.81, Wt: 73 kg, Hgb: 10 plt  338 INR 1.59  aPTT 43  Anti-Xa 0.94  Goal of Therapy:  Monitor SCr and CBC per anticoagulation policy   Plan:  Discontinued Lovenox 40 mg subq daily. Pt received one dose of apixaban 5mg  at 1051 on 1/14.   Will start heparin drip 12 hours later with baseline aPTT, INR, and heparin level drawn prior to initiation.   Initial rate 900 units/hr. No bolus due to Xa still elevated due to Eliquis dose. First aPTT and anti-Xa 6 hours after start of infusion.  1/15 AM aPTT 58, heparin level 0.68. Increase to 1000 units/hr and recheck aPTT and heparin level in 6 hours.  1/15 aPTT at 1252=58. Heparin level=0.53. Will increase to 1100 units/hr. Recheck aPTT/HL in 6 hrs.   When HL and aPTT correlate switch to HL.  1/15 20:00 anti-Xa 0.37, aPTT 59. Increase to 1200 units/hr and recheck  aPTT and heparin level in 6 hours.  1/16 04:00 anti-Xa 0.39, aPTT 74. Will continue to follow and adjust by heparin level only. Recheck in 6 hours to confirm.  1/16 10:00 anti-Xa 0.46, will continue with current rate of 1200 units/hr and check next anti-Xa level with 1/17 am labs. Will also check CBC with am labs.  Pharmacy will continue to follow.   Paulina Fusi, PharmD, BCPS 01/26/2015 11:20 AM

## 2015-01-26 NOTE — Care Management Important Message (Signed)
Important Message  Patient Details  Name: Spencer [name] MRN: [id number] Date of Birth: 1917-08-29   Medicare Important Message Given:  Yes    [name] 01/26/2015, 9:50 AM

## 2015-01-26 NOTE — Progress Notes (Signed)
Tried to notify son for permission to do pts. Procedure tomorrow. No answer. Message left.

## 2015-01-26 NOTE — Progress Notes (Signed)
[facility] NAME: Spencer [name]    MR#:  [id number]  DATE OF BIRTH:  1917-04-20  SUBJECTIVE:   No acute issues overnight. Patient voices no complaints this morning.  REVIEW OF SYSTEMS:    Review of Systems  Constitutional: Negative for fever, chills and malaise/fatigue.  HENT: Negative for sore throat.   Eyes: Negative for blurred vision.  Respiratory: Negative for cough, hemoptysis, shortness of breath and wheezing.   Cardiovascular: Negative for chest pain, palpitations and leg swelling.  Gastrointestinal: Negative for nausea, vomiting, abdominal pain, diarrhea and blood in stool.  Genitourinary: Negative for dysuria.  Musculoskeletal: Negative for back pain.  Skin:       RLE cellulitis wrapped  Neurological: Negative for dizziness, tremors, focal weakness and headaches.  Endo/Heme/Allergies: Does not bruise/bleed easily.  Psychiatric/Behavioral: Negative for depression.    Tolerating Diet:yes      DRUG ALLERGIES:  No Known Allergies  VITALS:  Blood pressure 136/62, pulse 65, temperature 97.8 F (36.6 C), temperature source Oral, resp. rate 16, height 5\' 6"  (1.676 m), weight 73.029 kg (161 lb), SpO2 93 %.  PHYSICAL EXAMINATION:   Physical Exam  Constitutional: He is oriented to person, place, and time and well-developed, well-nourished, and in no distress. No distress.  HENT:  Head: Normocephalic.  Eyes: No scleral icterus.  Neck: Normal range of motion. Neck supple. No JVD present. No tracheal deviation present.  Cardiovascular: Normal rate, regular rhythm and normal heart sounds.  Exam reveals no gallop and no friction rub.   No murmur heard. Pulmonary/Chest: Effort normal and breath sounds normal. No respiratory distress. He has no wheezes. He has no rales. He exhibits no tenderness.  Abdominal: Soft. Bowel sounds are normal. He exhibits no distension and no mass. There is no tenderness. There is no  rebound and no guarding.  Musculoskeletal: Normal range of motion. He exhibits no edema.  Neurological: He is alert and oriented to person, place, and time.  Skin: Skin is warm. No rash noted. No erythema.  His foot is wrapped  Psychiatric: Affect and judgment normal.      LABORATORY PANEL:   CBC  Recent Labs Lab 01/26/15 0348  WBC 17.2*  HGB 9.7*  HCT 31.8*  PLT 357   ------------------------------------------------------------------------------------------------------------------  Chemistries   Recent Labs Lab 01/24/15 0329  01/26/15 0348  NA 140  < > 138  K 3.7  < > 3.5  CL 109  < > 103  CO2 25  < > 28  GLUCOSE 136*  < > 117*  BUN 21*  < > 18  CREATININE 0.81  < > 0.87  CALCIUM 7.7*  < > 7.5*  MG 2.3  --   --   < > = values in this interval not displayed. ------------------------------------------------------------------------------------------------------------------  Cardiac Enzymes No results for input(s): TROPONINI in the last 168 hours. ------------------------------------------------------------------------------------------------------------------  RADIOLOGY:  Dg Chest 1 View  01/25/2015  CLINICAL DATA:  RIGHT foot cellulitis, diabetic ulcer, fever today EXAM: CHEST 1 VIEW COMPARISON:  Portable exam 1006 hours compared to 01/23/2015 FINDINGS: RIGHT arm PICC line with tip projecting over SVC. Enlargement of cardiac silhouette. Atherosclerotic calcification aorta. Slight pulmonary vascular congestion. Bibasilar opacities question atelectasis versus infiltrate little changed from previous exam. Upper lungs clear. No gross pleural effusion or pneumothorax. BILATERAL glenohumeral degenerative changes and chronic rotator cuff tears. IMPRESSION: Enlargement of cardiac silhouette. Bronchitic changes with bibasilar opacities question atelectasis versus consolidation, little changed. Electronically Signed  By: Lavonia Dana M.D.   On: 01/25/2015 12:10   Dg Foot  Complete Right  01/25/2015  CLINICAL DATA:  Wound with continue necrosis at medial aspect of first MTP joint along incision site, diabetic foot ulcer, hypertension, underwent irrigation and debridement of foot on 01/23/2015 EXAM: RIGHT FOOT COMPLETE - 3+ VIEW COMPARISON:  01/21/2015 FINDINGS: Dressing artifacts at medial aspect of RIGHT foot. Osseous demineralization. Joint space narrowing of first MTP joint and IP joint great toe. Decreased soft tissue swelling versus preoperative exam at medial forefoot. Scattered foci of soft tissue gas consistent with history of surgery and ulcer. No definite acute fracture, dislocation or bone destruction. Scattered small vessel vascular calcifications. IMPRESSION: Decreased soft tissue swelling at RIGHT foot with scattered foci of soft tissue gas which may be related to known ulcer or to interval surgery. No definite acute bony abnormalities. Osseous demineralization with degenerative changes at great toe. Electronically Signed   By: Lavonia Dana M.D.   On: 01/25/2015 13:39     ASSESSMENT AND PLAN:   82 year old male with past medical history of diabetes, hypertension, diabetic neuropathy, hyperlipidemia, history of arthritis of presents to the hospital due to falls and also noted to have a right foot cellulitis and diabetic foot ulcer.  1 Right foot cellulitis with diabetic foot ulcer/abscess: Wound culture is growing MSSA. Patient is currently on Zosyn. I discontinued vancomycin. ID is also following the patient and will continue to assist with antibiotic coverage. Patient's x-rays negative for any evidence of osteomyelitis. Appreciate podiatry, vascular and ID consultation. Plan for angiogram Tuesday. Patient may need further I&D after revascularization as per podiatry consultation.   2 MSSA Sepsis, present on admission:patient presented with tachycardia and leukocytosis on admission Continue IV antibiotics as mentioned above.   3 leukocytosis: This is  secondary to cellulitis with diabetic foot ulcer. White blood cell count is improving.   4 diabetes type 2 without complication: Continue sliding scale insulin and ADA diet. Metformin is on hold.  5 Depression - cont. Cymbalta.   6 Hyperlipidemia - cont. Simvastatin.   7. Slurred speech: Patient's family was stating the patient had slurred speech. CT head was negative as well as MRI. It does not appear on examination that patient has slurred speech and he has no other neurological deficits. Neurology thinks that the slurred speech is due to delirium.    8. New onset atrial fibrillation: Patient does not appear to be in atrial fibrillation at this time. Cardiology has recommended using anticoagulation. Patient is planned for angiogram this week and therefore is currently on heparin drip. Cardiology is  recommending ELIQUIS. I have already reviewed side effects, alternatives, benefits and risks with the patient's daughter.    40. . Meningioma: Patient reports that he has known about this meningioma.  MRI of brain was negative for stroke. No further workup for meningioma  10. Fever: Patient has not had a fever in 24 hours. Chest x-ray showed bronchitic changes with bibasilar opacities which is related to atelectasis versus consolidation. Patient has no symptoms of pneumonia. He is on Zosyn. Continue incentive spirometry.     Management plans discussed with the patient and he is in agreement.  CODE STATUS: FULL  TOTAL TIME TAKING CARE OF THIS PATIENT: 28 minutes.   Physical therapy is recommending skilled nursing facility. Clinical social worker aware.  POSSIBLE D/C 4-6 days, DEPENDING ON CLINICAL CONDITION.   Tenicia Gural M.D on 01/26/2015 at 9:48 AM  Between 7am to 6pm - Pager - [phone]  After 6pm go to [email or web address] - password [email or web address] [facility]  Office  [phone]  CC: Primary care physician; [name], MD  Note: This dictation was prepared  with Dragon dictation along with smaller phrase technology. Any transcriptional errors that result from this process are unintentional.

## 2015-01-26 NOTE — Progress Notes (Signed)
Nutrition Follow-up   INTERVENTION:   Meals and Snacks: Cater to patient preferences; will order snacks between meals Medical Food Supplement Therapy: Continue Ensure as ordered   NUTRITION DIAGNOSIS:   Inadequate oral intake related to acute illness as evidenced by per patient/family report.  GOAL:   Patient will meet greater than or equal to 90% of their needs; ongoing  MONITOR:    (Energy Intake, Electrolyte and Renal Profile, Anthropometrics, Digestive system, Glucose Profile)  REASON FOR ASSESSMENT:   Malnutrition Screening Tool    ASSESSMENT:   Pt admitted with right foot cellulitis seconary to diabetic foot ulcer and sepsis. Pt HOH. Pt scheduled for angiogram tomorrow and possibly will need I&D again following per MD note.   Diet Order:  Diet heart healthy/carb modified Room service appropriate?: Yes; Fluid consistency:: Thin    Current Nutrition: Pt eating only bites of meals today per daughter however is drinking Ensure. Recorded po intake 20-30% of meals consumed yesterday however 65-75% day before. Pt waiting on tea from kitchen on visit.    Gastrointestinal Profile: Last BM: 01/26/2015   Scheduled Medications:  . aspirin EC  81 mg Oral Daily  . docusate sodium  100 mg Oral Daily  . DULoxetine  20 mg Oral Daily  . feeding supplement (ENSURE ENLIVE)  237 mL Oral TID WC  . furosemide  40 mg Oral Daily  . insulin aspart  0-5 Units Subcutaneous QHS  . insulin aspart  0-9 Units Subcutaneous TID WC  . piperacillin-tazobactam (ZOSYN)  IV  3.375 g Intravenous 3 times per day  . simvastatin  20 mg Oral Daily    Continuous Medications:  . heparin 1,200 Units/hr (01/25/15 2200)     Electrolyte/Renal Profile and Glucose Profile:   Recent Labs Lab 01/23/15 1620 01/24/15 0329 01/25/15 0549 01/26/15 0348  NA  --  140 139 138  K  --  3.7 3.5 3.5  CL  --  109 106 103  CO2  --  25 25 28   BUN  --  21* 19 18  CREATININE  --  0.81 0.87 0.87  CALCIUM  --   7.7* 7.6* 7.5*  MG  --  2.3  --   --   PHOS 2.3*  --   --   --   GLUCOSE  --  136* 155* 117*   Protein Profile: No results for input(s): ALBUMIN in the last 168 hours.   Nutrition-Focused Physical Exam Findings:  Unable to complete Nutrition-Focused physical exam at this time.    Weight Trend since Admission: Filed Weights   01/21/15 1709 01/21/15 2119 01/23/15 0929  Weight: 210 lb (95.255 kg) 161 lb 12.8 oz (73.392 kg) 161 lb (73.029 kg)     BMI:  Body mass index is 26 kg/(m^2).  Estimated Nutritional Needs:   Kcal:  BEE: 1302kcals, TEE: (IF 1.1-1.3)(AF 1.2) 1720-2023kcals  Protein:  73-88g protein (1.0-1.2g/kg)  Fluid:  1825-2126mL of fluid (25-75mL/kg)  EDUCATION NEEDS:   No education needs identified at this time   [place], RD, LDN Pager [phone] Weekend/On-Call Pager [phone]

## 2015-01-26 NOTE — Clinical Social Work Placement (Signed)
   CLINICAL SOCIAL WORK PLACEMENT  NOTE  Date:  01/26/2015  Patient Details  Name: Gunnison Hersh MRN: [id number] Date of Birth: [birth year]  Clinical Social Work is seeking post-discharge placement for this patient at the [place] level of care (*CSW will initial, date and re-position this form in  chart as items are completed):  Yes (Patient is a [facility] resident and will transition to another part of their campus.)   Patient/family provided with [facility] Work Department's list of facilities offering this level of care within the geographic area requested by the patient (or if unable, by the patient's family).  Yes   Patient/family informed of their freedom to choose among providers that offer the needed level of care, that participate in Medicare, Medicaid or managed care program needed by the patient, have an available bed and are willing to accept the patient.  Yes   Patient/family informed of [facility]'s ownership interest in [facility] and [facility], as well as of the fact that they are under no obligation to receive care at these facilities.  PASRR submitted to EDS on 01/22/15     PASRR number received on 01/22/15     Existing PASRR number confirmed on       FL2 transmitted to all facilities in geographic area requested by pt/family on 01/22/15     FL2 transmitted to all facilities within larger geographic area on       Patient informed that his/her managed care company has contracts with or will negotiate with certain facilities, including the following:        Yes   Patient/family informed of bed offers received.  Patient chooses bed at  [facility] )     Physician recommends and patient chooses bed at      Patient to be transferred to   on  .  Patient to be transferred to facility by       Patient family notified on   of transfer.  Name of family member notified:        PHYSICIAN       Additional  Comment:    _______________________________________________ Loralyn Freshwater, LCSW 01/26/2015, 8:43 AM

## 2015-01-27 ENCOUNTER — Encounter
Admission: EM | Disposition: A | Discharge status: Skilled Nursing Facility | Payer: Self-pay | Source: Home / Self Care | Attending: Internal Medicine

## 2015-01-27 HISTORY — PX: PERIPHERAL VASCULAR CATHETERIZATION: SHX172C

## 2015-01-27 LAB — GLUCOSE, CAPILLARY
GLUCOSE-CAPILLARY: 128 mg/dL — AB (ref 65–99)
GLUCOSE-CAPILLARY: 91 mg/dL (ref 65–99)
GLUCOSE-CAPILLARY: 159 mg/dL — AB (ref 65–99)
Glucose-Capillary: 123 mg/dL — ABNORMAL HIGH (ref 65–99)

## 2015-01-27 LAB — CULTURE, BLOOD (ROUTINE X 2)
CULTURE: NO GROWTH
CULTURE: NO GROWTH

## 2015-01-27 LAB — BASIC METABOLIC PANEL
Anion gap: 8 (ref 5–15)
BUN: 18 mg/dL (ref 6–20)
CALCIUM: 7.3 mg/dL — AB (ref 8.9–10.3)
CO2: 26 mmol/L (ref 22–32)
CREATININE: 0.8 mg/dL (ref 0.61–1.24)
Chloride: 100 mmol/L — ABNORMAL LOW (ref 101–111)
GFR calc non Af Amer: >60 mL/min (ref >60)
Glucose, Bld: 116 mg/dL — ABNORMAL HIGH (ref 65–99)
Potassium: 3.2 mmol/L — ABNORMAL LOW (ref 3.5–5.1)
SODIUM: 134 mmol/L — AB (ref 135–145)

## 2015-01-27 LAB — CBC
HEMATOCRIT: 31.1 % — AB (ref 40.0–52.0)
Hemoglobin: 9.5 g/dL — ABNORMAL LOW (ref 13.0–18.0)
MCH: 21.9 pg — ABNORMAL LOW (ref 26.0–34.0)
MCHC: 30.6 g/dL — AB (ref 32.0–36.0)
MCV: 71.6 fL — ABNORMAL LOW (ref 80.0–100.0)
PLATELETS: 378 10*3/uL (ref 150–440)
RBC: 4.35 MIL/uL — ABNORMAL LOW (ref 4.40–5.90)
RDW: 17.6 % — AB (ref 11.5–14.5)
WBC: 16.3 10*3/uL — ABNORMAL HIGH (ref 3.8–10.6)

## 2015-01-27 LAB — ANAEROBIC CULTURE

## 2015-01-27 LAB — HEPARIN LEVEL (UNFRACTIONATED): Heparin Unfractionated: 0.22 IU/mL — ABNORMAL LOW (ref 0.30–0.70)

## 2015-01-27 SURGERY — ABDOMINAL AORTOGRAM W/LOWER EXTREMITY
Laterality: Right | Wound class: Clean

## 2015-01-27 MED ORDER — POTASSIUM CHLORIDE 10 MEQ/100ML IV SOLN: 10.0000 meq | INTRAVENOUS | Status: DC

## 2015-01-27 MED ORDER — MIDAZOLAM HCL 5 MG/5ML IJ SOLN
INTRAMUSCULAR | Status: AC
  Filled 2015-01-27: qty 5

## 2015-01-27 MED ORDER — IOHEXOL 300 MG/ML  SOLN
INTRAMUSCULAR | Status: DC | PRN
  Administered 2015-01-27: 75 mL via INTRA_ARTERIAL

## 2015-01-27 MED ORDER — HEPARIN SODIUM (PORCINE) 1000 UNIT/ML IJ SOLN
INTRAMUSCULAR | Status: DC | PRN
  Administered 2015-01-27: 5000 [IU] via INTRAVENOUS

## 2015-01-27 MED ORDER — CLOPIDOGREL BISULFATE 75 MG PO TABS
75.0000 mg | ORAL_TABLET | Freq: Every day | ORAL | Status: DC
  Administered 2015-01-28 – 2015-02-02 (×4): 75 mg via ORAL
  Filled 2015-01-27 (×5): qty 1

## 2015-01-27 MED ORDER — POTASSIUM CHLORIDE 10 MEQ/100ML IV SOLN
10.0000 meq | Freq: Once | INTRAVENOUS | Status: AC
  Administered 2015-01-27: 10 meq via INTRAVENOUS
  Filled 2015-01-27: qty 100

## 2015-01-27 MED ORDER — MIDAZOLAM HCL 2 MG/2ML IJ SOLN
INTRAMUSCULAR | Status: DC | PRN
  Administered 2015-01-27: 1 mg via INTRAVENOUS
  Administered 2015-01-27: 2 mg via INTRAVENOUS

## 2015-01-27 MED ORDER — LIDOCAINE HCL 1 % IJ SOLN
INTRAMUSCULAR | Status: DC | PRN
  Administered 2015-01-27: 5 mL via INTRADERMAL

## 2015-01-27 MED ORDER — SODIUM CHLORIDE 0.9 % IJ SOLN
3.0000 mL | Freq: Three times a day (TID) | INTRAMUSCULAR | Status: DC
  Administered 2015-01-27 – 2015-01-31 (×7): 3 mL via INTRAVENOUS

## 2015-01-27 MED ORDER — DEXTROSE 5 % IV SOLN
1.5000 g | Freq: Once | INTRAVENOUS | Status: AC
  Administered 2015-01-27: 1.5 g via INTRAVENOUS

## 2015-01-27 MED ORDER — POTASSIUM CHLORIDE CRYS ER 20 MEQ PO TBCR: 40.0000 meq | EXTENDED_RELEASE_TABLET | Freq: Once | ORAL | Status: DC

## 2015-01-27 MED ORDER — CLOPIDOGREL BISULFATE 75 MG PO TABS
300.0000 mg | ORAL_TABLET | Freq: Once | ORAL | Status: AC
  Administered 2015-01-27: 300 mg via ORAL
  Filled 2015-01-27: qty 4

## 2015-01-27 MED ORDER — MORPHINE SULFATE (PF) 2 MG/ML IV SOLN
2.0000 mg | Freq: Once | INTRAVENOUS | Status: AC
  Administered 2015-01-27: 2 mg via INTRAVENOUS

## 2015-01-27 MED ORDER — LIDOCAINE HCL (PF) 1 % IJ SOLN
INTRAMUSCULAR | Status: AC
  Filled 2015-01-27: qty 30

## 2015-01-27 MED ORDER — HEPARIN SODIUM (PORCINE) 1000 UNIT/ML IJ SOLN
INTRAMUSCULAR | Status: AC
  Filled 2015-01-27: qty 1

## 2015-01-27 MED ORDER — FENTANYL CITRATE (PF) 100 MCG/2ML IJ SOLN
INTRAMUSCULAR | Status: DC | PRN
  Administered 2015-01-27: 25 ug via INTRAVENOUS
  Administered 2015-01-27: 50 ug via INTRAVENOUS

## 2015-01-27 MED ORDER — POTASSIUM CHLORIDE 10 MEQ/100ML IV SOLN
10.0000 meq | INTRAVENOUS | Status: AC
  Administered 2015-01-27 (×3): 10 meq via INTRAVENOUS
  Filled 2015-01-27 (×4): qty 100

## 2015-01-27 MED ORDER — FENTANYL CITRATE (PF) 100 MCG/2ML IJ SOLN
INTRAMUSCULAR | Status: AC
  Filled 2015-01-27: qty 2

## 2015-01-27 MED ORDER — HEPARIN (PORCINE) IN NACL 2-0.9 UNIT/ML-% IJ SOLN
INTRAMUSCULAR | Status: AC
  Filled 2015-01-27: qty 1000

## 2015-01-27 MED ORDER — HEPARIN BOLUS VIA INFUSION
1000.0000 [IU] | Freq: Once | INTRAVENOUS | Status: AC
  Administered 2015-01-27: 1000 [IU] via INTRAVENOUS
  Filled 2015-01-27: qty 1000

## 2015-01-27 SURGICAL SUPPLY — 24 items
BALLN ULTRVRSE 2X300X150 (BALLOONS) ×1
BALLN ULTRVRSE 2X300X150 OTW (BALLOONS) ×3
BALLN ULTRVRSE 3X4X130 (BALLOONS) ×4 IMPLANT
BALLOON ULTRVRSE 2X300X150 OTW (BALLOONS) ×3 IMPLANT
CATH CROSSER S6 154CM (CATHETERS) ×4 IMPLANT
CATH PIG 70CM (CATHETERS) ×4 IMPLANT
CATH RIM 65CM (CATHETERS) ×4 IMPLANT
CATH SEEKER .035X135CM (CATHETERS) ×4 IMPLANT
CATH USHER TPER 130CM (CATHETERS) ×4 IMPLANT
DEVICE CLOSURE MYNXGRIP 6/7F (Vascular Products) ×4 IMPLANT
DEVICE PRESTO INFLATION (MISCELLANEOUS) ×4 IMPLANT
DEVICE TORQUE (MISCELLANEOUS) ×4 IMPLANT
GLIDEWIRE ANGLED SS 035X260CM (WIRE) ×4 IMPLANT
KIT FLOWMATE PROCEDURAL (MISCELLANEOUS) ×4 IMPLANT
PACK ANGIOGRAPHY (CUSTOM PROCEDURE TRAY) ×4 IMPLANT
SET INTRO CAPELLA COAXIAL (SET/KITS/TRAYS/PACK) ×4 IMPLANT
SHEATH BRITE TIP 5FRX11 (SHEATH) ×4 IMPLANT
SHEATH BRITE TIP 6FRX11 (SHEATH) ×4 IMPLANT
SHEATH RAABE 6FR (SHEATH) ×4 IMPLANT
SYR MEDRAD MARK V 150ML (SYRINGE) ×4 IMPLANT
TUBING CONTRAST HIGH PRESS 72 (TUBING) ×4 IMPLANT
VALVE HEMO TOUHY BORST Y (VALVE) ×4 IMPLANT
WIRE G V18X300CM (WIRE) ×4 IMPLANT
WIRE J 3MM .035X145CM (WIRE) ×4 IMPLANT

## 2015-01-27 NOTE — Progress Notes (Signed)
Pharmacy Antibiotic Follow-up Note  Spencer [name] is a 82 y.o. year-old male admitted on 01/21/2015.  The patient is currently on day 7 of Zosyn for diabetic foot infection.  Assessment/Plan: This patient's current antibiotics will be continued without adjustments.  Temp (24hrs), Avg:98.9 F (37.2 C), Min:98.1 F (36.7 C), Max:100.1 F (37.8 C)   Recent Labs Lab 01/22/15 0452 01/24/15 0329 01/25/15 0549 01/26/15 0348 01/27/15 0450  WBC 22.9* 22.6* 20.2* 17.2* 16.3*    Recent Labs Lab 01/23/15 0523 01/24/15 0329 01/25/15 0549 01/26/15 0348 01/27/15 0450  CREATININE 1.08 0.81 0.87 0.87 0.80   Estimated Creatinine Clearance: 49.8 mL/min (by C-G formula based on Cr of 0.8).    No Known Allergies  Microbiology results: Wound Cx: OSSA  Thank you for allowing pharmacy to be a part of this patient's care.  Paulina Fusi, PharmD, BCPS 01/27/2015 11:15 AM

## 2015-01-27 NOTE — Progress Notes (Signed)
Dr. Delana Meyer at bedside.  He was notified that patient is somnolent unless aroused.  Blood sugar was checked 91, Dr. Delana Meyer informed..  Patient is more alert than he was 15 mins ago

## 2015-01-27 NOTE — Progress Notes (Signed)
PT Cancellation Note  Patient Details Name: Anchor Huisinga MRN: [id number] DOB: December 27, 1917   Cancelled Treatment:    Reason Eval/Treat Not Completed: Other (comment). Pt currently out of room for angiogram, not available for therapy at this time. Will re-attempt as able.   Reyansh Kushnir 01/27/2015, 1:20 PM  Greggory Stallion, PT, DPT [phone]

## 2015-01-27 NOTE — Op Note (Signed)
[facility] Percutaneous Study/Intervention Procedural Note   Date of Surgery: 01/27/2015  Surgeon:  Katha Cabal, MD.  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 Introduction catheter into right lower extremity 3rd order catheter placement  2. Contrast injection right lower extremity for distal runoff   3. Percutaneous transluminal angioplasty right tibial peroneal trunk             4.  Percutaneous transluminal angioplasty right anterior tibial artery             5.  Crosser atherectomy right anterior tibial artery 6. Minx closure left superficial femoral arteriotomy  Anesthesia: Conscious sedation was administered under my direct supervision. IV Versed plus fentanyl were utilized. Continuous ECG, pulse oximetry and blood pressure was monitored throughout the entire procedure. A total of 3 milligrams of Versed and 75 micrograms of fentanyl were utilized.  Conscious sedation was begun at  1:46 PM and concluded at 3:22 PM for a total of 1 hour 36 minutes.  Sheath: 6 French Rabi  Contrast: 75 cc  Fluoroscopy Time: 11.8 minutes  Indications: Spencer [name] presents with gangrene of the right foot. He has undergone extensive debridement however his cellulitis is not resolving he remains febrile and he does not demonstrate significant wound healing. Physical examination is consistent with severe atherosclerotic occlusive disease. The risks and benefits are reviewed all questions answered patient agrees to proceed with angiography and the hope for intervention for limb salvage.  Procedure: Spencer [name] is a 82 y.o. y.o. male who was identified and appropriate procedural time out was performed. The patient was then placed supine on the table and prepped and  draped in the usual sterile fashion.   Ultrasound was placed in the sterile sleeve and the left groin was evaluated the left common femoral artery was echolucent and pulsatile indicating patency.  Image was recorded for the permanent record and under real-time visualization a microneedle was inserted into the common femoral artery microwire followed by a micro-sheath.  A J-wire was then advanced through the micro-sheath and a  5 [place] sheath was then inserted over a J-wire. J-wire was then advanced and a 5 French pigtail catheter was positioned at the level of T12. AP projection of the aorta was then obtained. Pigtail catheter was repositioned to above the bifurcation and a LAO view of the pelvis was obtained.  Subsequently a rim catheter with the stiff angle Glidewire was used to cross the aortic bifurcation the catheter wire were advanced down into the right distal external iliac artery. Oblique view of the femoral bifurcation was then obtained and subsequently the wire was reintroduced and the rim catheter exchanged for a pigtail catheter which was negotiated into the SFA representing third order catheter placement. Distal runoff was then performed.   5000 units of heparin was then given and allowed to circulate and a 6 Fr. Rabi sheath was advanced up and over the bifurcation and positioned in the femoral artery  Seeker catheter and stiff angle Glidewire were then negotiated down into the distal popliteal.  Distal runoff was then completed by hand injection through the catheter. A V-18 wire was then reintroduced and used to cross the string sign within the tibioperoneal trunk.  A 3 x 4 ultra versed balloon was then advanced across the lesion the  balloon was used to angioplasty the tibioperoneal trunk. Inflation was to 14 atm for 2 minutes.  Follow-up imaging demonstrated  wide patency of the tibioperoneal trunk.    Seeker catheter was then reintroduced and the VAT and wire removed.  The stiff angle  Glidewire was then negotiated down into the anterior tibial artery hand traction a contrast demonstrated multiple short segment occlusions.  The S6 Crosser atherectomy catheter was then prepped on the table a pressure catheter was advanced over the wire and subsequently a 6 was used to perform atherectomy of the anterior tibial from its proximal one third down onto the dorsalis pedis. Wire was then reintroduced and a 2 x 30 all traverse balloon was introduced and angioplasty was performed to 16 atm for approximately 2 minutes. Subsequently a 3 5 x 4 balloon was used angioplasty the origin of the anterior tibial. Follow-up imaging demonstrated an excellent result with resolution of the multiple stenoses and strictures that were previously done a side and now there is rapid flow of contrast down to the foot filling the dorsalis pedis.    After review of these images the sheath is pulled into the left  external iliac oblique of the common femoral is obtained and aminx  device deployed As the stick was noted to be in the SFA. There no immediate Complications.  Findings: The aorta is opacified with a bolus injection contrast. There are no hemodynamically significant lesions noted within the aorta, iliacs or external iliacs.  The right common femoral profunda femoris is widely patent. SFA is patent as well with nonhemodynamically significant disease noted. The trifurcation is patent however the posterior tibial artery occludes and remains occluded throughout it's course. There is a 90% stenosis in the tibioperoneal trunk and the peroneal is patent although small down to the ankle. The anterior tibial appears to be the dominant runoff to the foot with demonstrates multiple high-grade stenoses associated with several short segment occlusions.  Following angioplasty of the tibioperoneal trunk there is wide patency with in-line flow to the peroneal down to the ankle. Following Crosser atherectomy and subsequent  angioplasty anterior tibial is now widely patent filling the dorsalis pedis.  Summary: Successful recanalization of the right lower extremity with recruitment of both the anterior tibial as well as the peroneal for in-line perfusion of the right foot with the hope of limb salvage   Disposition: Patient was taken to the recovery room in stable condition having tolerated the procedure well.  Hisayo Delossantos, Dolores Lory 01/27/2015,3:23 PM

## 2015-01-27 NOTE — Progress Notes (Signed)
Report to Spencer [name] 1C.  Check right groin for bleeding or hematoma.  Patient will be on bedrest for 6 hours post sheath pull---out of bed at 21:30. Patient may have head of bed up ONLY 30 degrees and turn to eaither side as noted in orders at 17:25   Bilateral pulses are dopplers DP's.  Be alert as reported to you that patient will need a Plavix dose port procedure as ordered AS SOON AS  patient is alert enough to swallow pill safely

## 2015-01-27 NOTE — Progress Notes (Signed)
ANTICOAGULATION CONSULT NOTE - Follow Up Consult  Pharmacy Consult for heparin Indication: atrial fibrillation  No Known Allergies  Patient Measurements: Height: 5\' 6"  (167.6 cm) Weight: 161 lb (73.029 kg) IBW/kg (Calculated) : 63.8  Vital Signs: Temp: 98.9 F (37.2 C) (01/17 0339) Temp Source: Oral (01/17 0339) BP: 146/60 mmHg (01/17 0339) Pulse Rate: 77 (01/17 0339)  Labs:  Recent Labs  01/24/15 2301  01/25/15 0549 01/25/15 1252 01/25/15 2005 01/26/15 0348 01/26/15 0958 01/27/15 0450  HGB  --   < > 10.0*  --   --  9.7*  --  9.5*  HCT  --   --  32.4*  --   --  31.8*  --  31.1*  PLT  --   --  356  --   --  357  --  378  APTT 43*  --  58* 58* 59* 74*  --   --   LABPROT 19.0*  --   --   --   --   --   --   --   INR 1.59  --   --   --   --   --   --   --   HEPARINUNFRC 0.94*  --  0.68 0.53 0.37 0.39 0.46 0.22*  CREATININE  --   --  0.87  --   --  0.87  --  0.80  < > = values in this interval not displayed.  Estimated Creatinine Clearance: 49.8 mL/min (by C-G formula based on Cr of 0.8).   Medical History: Past Medical History  Diagnosis Date  . Diabetes mellitus without complication ([place])   . Hypertension   . Arthritis   . High cholesterol     Medications:  Scheduled:  . aspirin EC  81 mg Oral Daily  . docusate sodium  100 mg Oral Daily  . DULoxetine  20 mg Oral Daily  . feeding supplement (ENSURE ENLIVE)  237 mL Oral TID WC  . furosemide  40 mg Oral Daily  . insulin aspart  0-5 Units Subcutaneous QHS  . insulin aspart  0-9 Units Subcutaneous TID WC  . piperacillin-tazobactam (ZOSYN)  IV  3.375 g Intravenous 3 times per day  . simvastatin  20 mg Oral Daily    Assessment: Pharmacy consulted to start heparin in a 82 yo male with atrial fibrillation due to angiogram scheduled in the next couple days.  Patient received last dose of Lovenox 40 mg subq on 1/14 at ~0100 and last dose of apixaban 5mg  at 1051 on 1/14.    SCr: 0.81, Wt: 73 kg, Hgb: 10 plt  338 INR 1.59  aPTT 43  Anti-Xa 0.94  Goal of Therapy:  Monitor SCr and CBC per anticoagulation policy   Plan:  Discontinued Lovenox 40 mg subq daily. Pt received one dose of apixaban 5mg  at 1051 on 1/14.   Will start heparin drip 12 hours later with baseline aPTT, INR, and heparin level drawn prior to initiation.   Initial rate 900 units/hr. No bolus due to Xa still elevated due to Eliquis dose. First aPTT and anti-Xa 6 hours after start of infusion.  1/15 AM aPTT 58, heparin level 0.68. Increase to 1000 units/hr and recheck aPTT and heparin level in 6 hours.  1/15 aPTT at 1252=58. Heparin level=0.53. Will increase to 1100 units/hr. Recheck aPTT/HL in 6 hrs.   When HL and aPTT correlate switch to HL.  1/15 20:00 anti-Xa 0.37, aPTT 59. Increase to 1200 units/hr and recheck aPTT and  heparin level in 6 hours.  1/16 04:00 anti-Xa 0.39, aPTT 74. Will continue to follow and adjust by heparin level only. Recheck in 6 hours to confirm.  1/16 10:00 anti-Xa 0.46, will continue with current rate of 1200 units/hr and check next anti-Xa level with 1/17 am labs. Will also check CBC with am labs.  01/17 0450 heparin level subtherapeutic. Bolus 1000 units IV x 1 and increase rate to 1300 units/hr. Will recheck in 8 hours (for age > 41).  Pharmacy will continue to follow.   Rosabel Sermeno A. [place], [place].D., BCPS Clinical Pharmacist 01/27/2015 6:02 AM

## 2015-01-27 NOTE — Progress Notes (Signed)
[facility] at [place] NAME: Spencer [name]    MR#:  [id number]  DATE OF BIRTH:  1917-11-28  SUBJECTIVE:  CHIEF COMPLAINT:   Chief Complaint  Patient presents with  . Fall  . Weakness   -Patient is hard of hearing. Has right first metatarsophalangeal joint infection with superficial necrosis. -Status post debridement and on antibiotics. For angiogram today. And might need repeat debridement. -Complaining of significant pain in the right toe and foot region.  REVIEW OF SYSTEMS:  Review of Systems  Constitutional: Negative for fever and chills.  HENT: Positive for hearing loss. Negative for ear discharge, ear pain and nosebleeds.   Eyes: Negative for blurred vision.  Respiratory: Negative for cough, shortness of breath and wheezing.   Cardiovascular: Negative for chest pain and palpitations.  Gastrointestinal: Negative for nausea, vomiting, abdominal pain, diarrhea and constipation.  Genitourinary: Negative for dysuria.  Musculoskeletal: Negative for myalgias.       Significant right foot pain  Neurological: Positive for weakness. Negative for dizziness, tremors, speech change, focal weakness, seizures and headaches.  Psychiatric/Behavioral: Negative for depression.    DRUG ALLERGIES:  No Known Allergies  VITALS:  Blood pressure 140/51, pulse 61, temperature 98.3 F (36.8 C), temperature source Oral, resp. rate 24, height 5\' 6"  (1.676 m), weight 73.029 kg (161 lb), SpO2 93 %.  PHYSICAL EXAMINATION:  Physical Exam  GENERAL:  82 y.o.-year-old patient lying in the bed with no acute distress. Hard of hearing. EYES: Pupils equal, round, reactive to light and accommodation. No scleral icterus. Extraocular muscles intact.  HEENT: Head atraumatic, normocephalic. Oropharynx and nasopharynx clear.  NECK:  Supple, no jugular venous distention. No thyroid enlargement, no tenderness.  LUNGS: Normal breath sounds bilaterally, no wheezing,  rales,rhonchi or crepitation. No use of accessory muscles of respiration.  CARDIOVASCULAR: S1, S2 normal. No rubs, or gallops. Loud 3/6 systolic murmur present all over the precordium.  ABDOMEN: Soft, nontender, nondistended. Bowel sounds present. No organomegaly or mass.  EXTREMITIES: No pedal edema, cyanosis, or clubbing. Right foot is wrapped with ACE wrap. NEUROLOGIC: Cranial nerves II through XII are intact. Muscle strength 5/5 in all extremities. Sensation intact. Gait not checked.  PSYCHIATRIC: The patient is alert and oriented x 3.  SKIN: No obvious rash, lesion, or ulcer. Other than right foot ulcer medially- wrapped now.   LABORATORY PANEL:   CBC  Recent Labs Lab 01/27/15 0450  WBC 16.3*  HGB 9.5*  HCT 31.1*  PLT 378   ------------------------------------------------------------------------------------------------------------------  Chemistries   Recent Labs Lab 01/24/15 0329  01/27/15 0450  NA 140  < > 134*  K 3.7  < > 3.2*  CL 109  < > 100*  CO2 25  < > 26  GLUCOSE 136*  < > 116*  BUN 21*  < > 18  CREATININE 0.81  < > 0.80  CALCIUM 7.7*  < > 7.3*  MG 2.3  --   --   < > = values in this interval not displayed. ------------------------------------------------------------------------------------------------------------------  Cardiac Enzymes No results for input(s): TROPONINI in the last 168 hours. ------------------------------------------------------------------------------------------------------------------  RADIOLOGY:  Dg Foot Complete Right  01/25/2015  CLINICAL DATA:  Wound with continue necrosis at medial aspect of first MTP joint along incision site, diabetic foot ulcer, hypertension, underwent irrigation and debridement of foot on 01/23/2015 EXAM: RIGHT FOOT COMPLETE - 3+ VIEW COMPARISON:  01/21/2015 FINDINGS: Dressing artifacts at medial aspect of RIGHT foot. Osseous demineralization. Joint space narrowing of first MTP  joint and IP joint great toe.  Decreased soft tissue swelling versus preoperative exam at medial forefoot. Scattered foci of soft tissue gas consistent with history of surgery and ulcer. No definite acute fracture, dislocation or bone destruction. Scattered small vessel vascular calcifications. IMPRESSION: Decreased soft tissue swelling at RIGHT foot with scattered foci of soft tissue gas which may be related to known ulcer or to interval surgery. No definite acute bony abnormalities. Osseous demineralization with degenerative changes at great toe. Electronically Signed   By: Lavonia Dana M.D.   On: 01/25/2015 13:39    EKG:   Orders placed or performed during the hospital encounter of 01/21/15  . EKG 12-Lead  . EKG 12-Lead    ASSESSMENT AND PLAN:   82 year old male with past medical history of diabetes, hypertension, diabetic neuropathy, hyperlipidemia, history of arthritis of presents to the hospital due to falls and also noted to have a right foot cellulitis and diabetic foot ulcer.  1 Right foot cellulitis with diabetic foot ulcer/abscess: after a trauma - appreciate podiatry consult and had debridement done after admission - appreciate vascular consult- for angiogram today and likely will need repeat debridement - Wound cultures growing MSSA. Patient is currently on Zosyn. - ID following -Patient's x-rays negative for any evidence of osteomyelitis - pain meds prn  2 MSSA Sepsis, present on admission: Continue IV antibiotics as mentioned above.   3. Hypokalemia- being replaced  4 diabetes type 2 without complication: Continue sliding scale insulin and ADA diet. Metformin is on hold for angiogram.  5 Depression - cont. Cymbalta.   6 Hyperlipidemia - cont. Simvastatin.   7. Slurred speech: Patient's family was stating the patient had slurred speech. CT head was negative as well as MRI. No new neuro deficits - appreciate neurology consult, likely delirium per them.  8. New onset atrial fibrillation: Patient does  not appear to be in atrial fibrillation at this time.  - rate controlled -Cardiology has recommended using anticoagulation. currently on heparin drip for angiogram today. Cardiology is recommending ELIQUIS at discharge. Patient remains in NSR now.  9. . Meningioma: Patient reports that he has known about this meningioma.  MRI of brain was negative for stroke. No further workup for meningioma  10. DVT Prophylaxis- currently on heparin drip   All the records are reviewed and case discussed with Care Management/Social Workerr. Management plans discussed with the patient, family and they are in agreement.  CODE STATUS: Full Code  TOTAL TIME TAKING CARE OF THIS PATIENT: 38 minutes.   POSSIBLE D/C IN 3-4 DAYS, DEPENDING ON CLINICAL CONDITION.   Gladstone Lighter M.D on 01/27/2015 at 10:57 AM  Between 7am to 6pm - Pager - [phone]  After 6pm go to [email or web address] - password [email or web address] [facility]  [facility]  Office  [phone]  CC: Primary care physician; [name], MD

## 2015-01-27 NOTE — Progress Notes (Signed)
Pt doing well post angioplasty. No bleeding vital signs stable. Pt is easy to arouse but not able to stay awake long enough to take medication. Daughter in the room and is aware of plan of care.

## 2015-01-27 NOTE — OR Nursing (Signed)
15:00 vital signs monitored by RN and documented into CHL  by vasc. Tech.

## 2015-01-28 ENCOUNTER — Encounter: Payer: Self-pay | Admitting: Vascular Surgery

## 2015-01-28 LAB — GLUCOSE, CAPILLARY
GLUCOSE-CAPILLARY: 209 mg/dL — AB (ref 65–99)
Glucose-Capillary: 121 mg/dL — ABNORMAL HIGH (ref 65–99)
Glucose-Capillary: 240 mg/dL — ABNORMAL HIGH (ref 65–99)
Glucose-Capillary: 262 mg/dL — ABNORMAL HIGH (ref 65–99)

## 2015-01-28 LAB — CBC
HCT: 30.9 % — ABNORMAL LOW (ref 40.0–52.0)
Hemoglobin: 9.5 g/dL — ABNORMAL LOW (ref 13.0–18.0)
MCH: 22.3 pg — AB (ref 26.0–34.0)
MCHC: 30.7 g/dL — ABNORMAL LOW (ref 32.0–36.0)
MCV: 72.8 fL — AB (ref 80.0–100.0)
PLATELETS: 402 10*3/uL (ref 150–440)
RBC: 4.24 MIL/uL — ABNORMAL LOW (ref 4.40–5.90)
RDW: 17.8 % — AB (ref 11.5–14.5)
WBC: 15.3 10*3/uL — ABNORMAL HIGH (ref 3.8–10.6)

## 2015-01-28 LAB — BASIC METABOLIC PANEL
Anion gap: 4 — ABNORMAL LOW (ref 5–15)
BUN: 16 mg/dL (ref 6–20)
CALCIUM: 7.5 mg/dL — AB (ref 8.9–10.3)
CO2: 27 mmol/L (ref 22–32)
Chloride: 106 mmol/L (ref 101–111)
Creatinine, Ser: 0.82 mg/dL (ref 0.61–1.24)
GFR calc Af Amer: >60 mL/min (ref >60)
GLUCOSE: 128 mg/dL — AB (ref 65–99)
Potassium: 4 mmol/L (ref 3.5–5.1)
Sodium: 137 mmol/L (ref 135–145)

## 2015-01-28 MED ORDER — ENOXAPARIN SODIUM 40 MG/0.4ML ~~LOC~~ SOLN
40.0000 mg | SUBCUTANEOUS | Status: DC
  Administered 2015-01-28 – 2015-02-01 (×5): 40 mg via SUBCUTANEOUS
  Filled 2015-01-28 (×5): qty 0.4

## 2015-01-28 NOTE — Progress Notes (Signed)
[facility] at [place] NAME: Spencer [name]    MR#:  [id number]  DATE OF BIRTH:  1917/08/08  SUBJECTIVE:  CHIEF COMPLAINT:   Chief Complaint  Patient presents with  . Fall  . Weakness   -Patient is hard of hearing. Has right first metatarsophalangeal joint infection with superficial necrosis. -Status post debridement . s/p angiogram with right leg PCI and intervention on 01/27/15 with significant improvement in his pain. And might need repeat debridement.  REVIEW OF SYSTEMS:  Review of Systems  Constitutional: Negative for fever and chills.  HENT: Positive for hearing loss. Negative for ear discharge, ear pain and nosebleeds.   Eyes: Negative for blurred vision.  Respiratory: Negative for cough, shortness of breath and wheezing.   Cardiovascular: Negative for chest pain and palpitations.  Gastrointestinal: Negative for nausea, vomiting, abdominal pain, diarrhea and constipation.  Genitourinary: Negative for dysuria.  Musculoskeletal: Negative for myalgias.       Improved right foot pain  Neurological: Positive for weakness. Negative for dizziness, tremors, speech change, focal weakness, seizures and headaches.  Psychiatric/Behavioral: Negative for depression.    DRUG ALLERGIES:  No Known Allergies  VITALS:  Blood pressure 139/53, pulse 83, temperature 99.4 F (37.4 C), temperature source Oral, resp. rate 20, height 5\' 6"  (1.676 m), weight 73.029 kg (161 lb), SpO2 98 %.  PHYSICAL EXAMINATION:  Physical Exam  GENERAL:  82 y.o.-year-old patient lying in the bed with no acute distress. Hard of hearing. EYES: Pupils equal, round, reactive to light and accommodation. No scleral icterus. Extraocular muscles intact.  HEENT: Head atraumatic, normocephalic. Oropharynx and nasopharynx clear.  NECK:  Supple, no jugular venous distention. No thyroid enlargement, no tenderness.  LUNGS: Normal breath sounds bilaterally, no wheezing,  rales,rhonchi or crepitation. No use of accessory muscles of respiration.  CARDIOVASCULAR: S1, S2 normal. No rubs, or gallops. Loud 3/6 systolic murmur present all over the precordium.  ABDOMEN: Soft, nontender, nondistended. Bowel sounds present. No organomegaly or mass.  EXTREMITIES: No pedal edema, cyanosis, or clubbing. Right foot is wrapped with ACE wrap. NEUROLOGIC: Cranial nerves II through XII are intact. Muscle strength 5/5 in all extremities. Sensation intact. Gait not checked.  PSYCHIATRIC: The patient is alert and oriented x 3.  SKIN: No obvious rash, lesion, or ulcer. Other than right foot ulcer medially- wrapped now.   LABORATORY PANEL:   CBC  Recent Labs Lab 01/28/15 0541  WBC 15.3*  HGB 9.5*  HCT 30.9*  PLT 402   ------------------------------------------------------------------------------------------------------------------  Chemistries   Recent Labs Lab 01/24/15 0329  01/28/15 0541  NA 140  < > 137  K 3.7  < > 4.0  CL 109  < > 106  CO2 25  < > 27  GLUCOSE 136*  < > 128*  BUN 21*  < > 16  CREATININE 0.81  < > 0.82  CALCIUM 7.7*  < > 7.5*  MG 2.3  --   --   < > = values in this interval not displayed. ------------------------------------------------------------------------------------------------------------------  Cardiac Enzymes No results for input(s): TROPONINI in the last 168 hours. ------------------------------------------------------------------------------------------------------------------  RADIOLOGY:  No results found.  EKG:   Orders placed or performed during the hospital encounter of 01/21/15  . EKG 12-Lead  . EKG 12-Lead    ASSESSMENT AND PLAN:   82 year old male with past medical history of diabetes, hypertension, diabetic neuropathy, hyperlipidemia, history of arthritis of presents to the hospital due to falls and also noted to have a right  foot cellulitis and diabetic foot ulcer.  1 Right foot cellulitis with diabetic foot  ulcer/abscess: after a trauma - appreciate podiatry consult and had debridement done after admission- repeat eval today for repeat debridement - appreciate vascular consult- s/p angiogram and PCI intervention to right leg - Wound cultures growing MSSA. Patient is currently on Zosyn. - ID following -Patient's x-rays negative for any evidence of osteomyelitis - pain meds prn  2 MSSA Sepsis, present on admission: Continue IV zosyn as mentioned above.   3. Hypokalemia- replaced  4 diabetes type 2 without complication: Continue sliding scale insulin and ADA diet. Metformin is on hold for angiogram.  5 Depression - cont. Cymbalta.   6 Hyperlipidemia - cont. Simvastatin.   7. New onset atrial fibrillation: in afib - rate controlled -Cardiology has recommended using anticoagulation. off heparin drip. No need to bridge now. Cardiology is recommending ELIQUIS at discharge- will hold off as patient might need debridement again. Patient remains in NSR now.  8. . Meningioma: Patient reports that he has known about this meningioma.  MRI of brain was negative for stroke. No further workup for meningioma  9. DVT Prophylaxis- off heparin drip. Start lovenox  PT recommended SNF   All the records are reviewed and case discussed with Care Management/Social Workerr. Management plans discussed with the patient, family and they are in agreement.  CODE STATUS: Full Code  TOTAL TIME TAKING CARE OF THIS PATIENT: 38 minutes.   POSSIBLE D/C IN 3-4 DAYS, DEPENDING ON CLINICAL CONDITION.   Gladstone Lighter M.D on 01/28/2015 at 12:40 PM  Between 7am to 6pm - Pager - [phone]  After 6pm go to [email or web address] - password [email or web address] [facility]  [place] [facility]  Office  [phone]  CC: Primary care physician; [name], MD

## 2015-01-28 NOTE — Progress Notes (Signed)
Physical Therapy Treatment Patient Details Name: Spencer [name] MRN: [id number] DOB: Nov 03, 1917 Today's Date: 01/28/2015    History of Present Illness Pt here with R LE cellulitis, having significant circulatory issues, now s/p angiogram on 01/27/15. History also significant for I&D on 01/23/15.    PT Comments    Pt is making gradual progress towards goals with ability to tolerate OOB mobility to recliner. Pt has unsafe technique and tries to sit prior to therapist instruction. Needs +2 assist at this time for all mobility. Pt unable to hop, but can pivot on heel and slide L foot on floor in order to transfer to recliner. Good endurance with there-ex. All mobility performed on room air with sats at 94% at rest on room air and 90% with exertion.  Follow Up Recommendations  SNF     Equipment Recommendations       Recommendations for Other Services       Precautions / Restrictions Precautions Precautions: Fall Restrictions Weight Bearing Restrictions: Yes RLE Weight Bearing: Non weight bearing    Mobility  Bed Mobility Overal bed mobility: Needs Assistance Bed Mobility: Supine to Sit     Supine to sit: Mod assist     General bed mobility comments: assist for trunk stability and scooting out towards EOB. Once seated at EOB, pt able to sit with cga.  Transfers Overall transfer level: Needs assistance Equipment used: Rolling walker (2 wheeled) Transfers: Sit to/from Stand Sit to Stand: Mod assist         General transfer comment: assist for transfer and rw with +2 assist. Pt requires heavy cues for sequencing as pt is very HOH. Once standing, pt able to maintain NWB on R foot.  Ambulation/Gait Ambulation/Gait assistance: Mod assist Ambulation Distance (Feet): 3 Feet Assistive device: Rolling walker (2 wheeled) Gait Pattern/deviations: Step-to pattern     General Gait Details: +2 assist. Pt not able to hop, however is able to pivot on L foot in order to slide L foot  across floor to recliner. Pt tried to sit prior to therapist instruction with chair needing to be pulled up behind pt.  Pt fatigued, unable to tolerate further mobility at this time.   Stairs            Wheelchair Mobility    Modified Rankin (Stroke Patients Only)       Balance                                    Cognition Arousal/Alertness: Awake/alert Behavior During Therapy: WFL for tasks assessed/performed Overall Cognitive Status: Within Functional Limits for tasks assessed                      Exercises Other Exercises Other Exercises: Pt performed supine/seated ther-ex including B LE SLRs, heel slides, hip abd/add, and LAQ x 10 reps with supervision. Safe technique performed with demonstration required as pt is very HOH.    General Comments        Pertinent Vitals/Pain Pain Assessment: No/denies pain    Home Living                      Prior Function            PT Goals (current goals can now be found in the care plan section) Acute Rehab PT Goals Patient Stated Goal: to get to chair PT Goal Formulation: With  patient Time For Goal Achievement: 02/07/15 Potential to Achieve Goals: Fair Progress towards PT goals: Progressing toward goals    Frequency  Min 2X/week    PT Plan Current plan remains appropriate    Co-evaluation             End of Session Equipment Utilized During Treatment: Gait belt Activity Tolerance: Patient limited by fatigue Patient left: in chair;with chair alarm set     Time: 0955-1020 PT Time Calculation (min) (ACUTE ONLY): 25 min  Charges:  $Gait Training: 8-22 mins $Therapeutic Exercise: 8-22 mins                    G Codes:      Sandrika Schwinn 2015-02-26, 10:35 AM  Greggory Stallion, PT, DPT [phone]

## 2015-01-28 NOTE — Progress Notes (Signed)
Clinical Education officer, museum (CSW) updated Cabin crew at [facility] on patient's status. CSW made Seth Bake aware that patient will be on IV Abx for 2-4 weeks per MD note. CSW will continue to follow and assist as needed.   Blima Rich, LCSW [phone]

## 2015-01-28 NOTE — Progress Notes (Signed)
[facility]  Daily Progress Note   Subjective  - 1 Day Post-Op  Much more alert this evening he actually carried on meaningful conversation he denies foot pain  Objective Filed Vitals:   01/27/15 2320 01/28/15 0333 01/28/15 0707 01/28/15 1500  BP: 139/50 132/47 139/53 145/47  Pulse: 81 83 83 78  Temp: 98.2 F (36.8 C) 98.7 F (37.1 C) 99.4 F (37.4 C) 99.9 F (37.7 C)  TempSrc: Oral Oral Oral Oral  Resp: 19 19 20 16   Height:      Weight:      SpO2: 98% 94% 98% 95%    Intake/Output Summary (Last 24 hours) at 01/28/15 1955 Last data filed at 01/28/15 1844  Gross per 24 hour  Intake    410 ml  Output    650 ml  Net   -240 ml    PULM  Normal effort , no use of accessory muscles CV  No JVD, RRR Abd      No distended, nontender VASC  right foot is hyperemic dressing is clean dry and intact weakly palpable distal anterior tibial pulse is noted  Laboratory CBC    Component Value Date/Time   WBC 15.3* 01/28/2015 0541   HGB 9.5* 01/28/2015 0541   HCT 30.9* 01/28/2015 0541   PLT 402 01/28/2015 0541    BMET    Component Value Date/Time   NA 137 01/28/2015 0541   K 4.0 01/28/2015 0541   CL 106 01/28/2015 0541   CO2 27 01/28/2015 0541   GLUCOSE 128* 01/28/2015 0541   BUN 16 01/28/2015 0541   CREATININE 0.82 01/28/2015 0541   CALCIUM 7.5* 01/28/2015 0541   GFRNONAA >60 01/28/2015 0541   GFRAA >60 01/28/2015 0541    Assessment/Planning: POD #1 s/p revascularization right lower extremity   Continue anticoagulation as well as antiplatelet therapy. Dr. Vickki Muff is planning to repeat debridement later this week and I will continue to follow.    Allysen Lazo, Dolores Lory  01/28/2015, 7:55 PM

## 2015-01-28 NOTE — Care Management Important Message (Signed)
Important Message  Patient Details  Name: Spencer [name] MRN: [id number] Date of Birth: 1917/01/12   Medicare Important Message Given:  Yes    Juliann Pulse A Alexsa Flaum 01/28/2015, 9:39 AM

## 2015-01-28 NOTE — Progress Notes (Signed)
[facility] INFECTI[facility] PROGRESS NOTE Date of Admission:  01/21/2015     ID: Spencer [name] is a 82 y.o. male with  Le abscess, DM and PVD Active Problems:   Cellulitis   Subjective: No fevers, s/p revascularization. More interactive today  ROS  Eleven systems are reviewed and negative except per hpi  Medications:  Antibiotics Given (last 72 hours)    Date/Time Action Medication Dose Rate   01/25/15 1440 Given   piperacillin-tazobactam (ZOSYN) IVPB 3.375 g 3.375 g 12.5 mL/hr   01/25/15 2116 Given   piperacillin-tazobactam (ZOSYN) IVPB 3.375 g 3.375 g 12.5 mL/hr   01/26/15 0610 Given   piperacillin-tazobactam (ZOSYN) IVPB 3.375 g 3.375 g 12.5 mL/hr   01/26/15 1418 Given   piperacillin-tazobactam (ZOSYN) IVPB 3.375 g 3.375 g 12.5 mL/hr   01/26/15 2222 Given   piperacillin-tazobactam (ZOSYN) IVPB 3.375 g 3.375 g 12.5 mL/hr   01/27/15 0539 Given   piperacillin-tazobactam (ZOSYN) IVPB 3.375 g 3.375 g 12.5 mL/hr   01/27/15 1349 Given   cefUROXime (ZINACEF) 1.5 g in dextrose 5 % 50 mL IVPB 1.5 g 100 mL/hr   01/27/15 2209 Given   piperacillin-tazobactam (ZOSYN) IVPB 3.375 g 3.375 g 12.5 mL/hr   01/28/15 [id number] Given   piperacillin-tazobactam (ZOSYN) IVPB 3.375 g 3.375 g 12.5 mL/hr   01/28/15 1316 Given   piperacillin-tazobactam (ZOSYN) IVPB 3.375 g 3.375 g 12.5 mL/hr     . aspirin EC  81 mg Oral Daily  . clopidogrel  75 mg Oral Q breakfast  . docusate sodium  100 mg Oral Daily  . DULoxetine  20 mg Oral Daily  . enoxaparin (LOVENOX) injection  40 mg Subcutaneous Q24H  . feeding supplement (ENSURE ENLIVE)  237 mL Oral TID WC  . furosemide  40 mg Oral Daily  . insulin aspart  0-5 Units Subcutaneous QHS  . insulin aspart  0-9 Units Subcutaneous TID WC  . piperacillin-tazobactam (ZOSYN)  IV  3.375 g Intravenous 3 times per day  . simvastatin  20 mg Oral Daily  . sodium chloride  3 mL Intravenous Q8H    Objective: Vital signs in last 24 hours: Temp:  [98 F (36.7  C)-99.4 F (37.4 C)] 99.4 F (37.4 C) (01/18 0707) Pulse Rate:  [62-83] 83 (01/18 0707) Resp:  [14-20] 20 (01/18 0707) BP: (127-160)/(47-72) 139/53 mmHg (01/18 0707) SpO2:  [90 %-100 %] 98 % (01/18 0707) Constitutional: He is oriented to person, place, and time. Frail HENT: perrla  Mouth/Throat: Oropharynx is clear and dry. No oropharyngeal exudate.  Cardiovascular: Normal rate, 3/6 sm  Pulmonary/Chest: bil rhonchi  Abdominal: Soft. Bowel sounds are normal. He exhibits no distension. There is no tenderness.  Lymphadenopathy:  He has no cervical adenopathy.  Neurological: He is alert and oriented to person, place, and time.  Skin: R medial foot with area of mumified gangrene, min draiange. Plantar foot incisionsite with some drainge Psychiatric: He has a normal mood and affect. His behavior is normal.   Lab Results  Recent Labs  01/27/15 0450 01/28/15 0541  WBC 16.3* 15.3*  HGB 9.5* 9.5*  HCT 31.1* 30.9*  NA 134* 137  K 3.2* 4.0  CL 100* 106  CO2 26 27  BUN 18 16  CREATININE 0.80 0.82    Microbiology: Results for orders placed or performed during the hospital encounter of 01/21/15  Culture, blood (routine x 2)     Status: None   Collection Time: 01/21/15  6:48 PM  Result Value Ref Range Status  Specimen Description BLOOD RIGHT WRIST  Final   Special Requests BAA,ANA,AER,6ML  Final   Culture NO GROWTH 6 DAYS  Final   Report Status 01/27/2015 FINAL  Final  Culture, blood (routine x 2)     Status: None   Collection Time: 01/21/15  6:48 PM  Result Value Ref Range Status   Specimen Description BLOOD LEFT ARM  Final   Special Requests BAA,ANA,AER,7ML  Final   Culture NO GROWTH 6 DAYS  Final   Report Status 01/27/2015 FINAL  Final  Wound culture     Status: None   Collection Time: 01/22/15  5:50 PM  Result Value Ref Range Status   Specimen Description ABSCESS  Final   Special Requests Immunocompromised  Final   Gram Stain RARE WBC SEEN FEW GRAM POSITIVE  COCCI   Final   Culture RARE GROWTH STAPHYLOCOCCUS AUREUS  Final   Report Status 01/26/2015 FINAL  Final   Organism ID, Bacteria STAPHYLOCOCCUS AUREUS  Final      Susceptibility   Staphylococcus aureus - MIC*    CIPROFLOXACIN <=0.5 SENSITIVE Sensitive     GENTAMICIN <=0.5 SENSITIVE Sensitive     OXACILLIN <=0.25 SENSITIVE Sensitive     TRIMETH/SULFA <=10 SENSITIVE Sensitive     CEFOXITIN SCREEN NEGATIVE Sensitive     Inducible Clindamycin NEGATIVE Sensitive     TETRACYCLINE Value in next row Sensitive      SENSITIVE<=1    * RARE GROWTH STAPHYLOCOCCUS AUREUS  Anaerobic culture     Status: None   Collection Time: 01/23/15 10:32 AM  Result Value Ref Range Status   Specimen Description WOUND  Final   Special Requests NONE  Final   Culture NO ANAEROBES ISOLATED  Final   Report Status 01/27/2015 FINAL  Final  Culture, routine-abscess     Status: None   Collection Time: 01/23/15 10:32 AM  Result Value Ref Range Status   Specimen Description WOUND  Final   Special Requests NONE  Final   Gram Stain   Final    FEW WBC SEEN FEW GRAM NEGATIVE RODS RARE GRAM POSITIVE COCCI IN PAIRS    Culture LIGHT GROWTH STAPHYLOCOCCUS AUREUS  Final   Report Status 01/26/2015 FINAL  Final   Organism ID, Bacteria STAPHYLOCOCCUS AUREUS  Final      Susceptibility   Staphylococcus aureus - MIC*    CIPROFLOXACIN <=0.5 SENSITIVE Sensitive     ERYTHROMYCIN <=0.25 SENSITIVE Sensitive     GENTAMICIN <=0.5 SENSITIVE Sensitive     OXACILLIN <=0.25 SENSITIVE Sensitive     TRIMETH/SULFA <=10 SENSITIVE Sensitive     CLINDAMYCIN <=0.25 SENSITIVE Sensitive     CEFOXITIN SCREEN NEGATIVE Sensitive     Inducible Clindamycin NEGATIVE Sensitive     TETRACYCLINE Value in next row Sensitive      SENSITIVE<=1    RIFAMPIN Value in next row Sensitive      SENSITIVE<=0.5    VANCOMYCIN Value in next row Sensitive      SENSITIVE1    * LIGHT GROWTH STAPHYLOCOCCUS AUREUS    Studies/Results: Dg Chest 1 View  01/25/2015   CLINICAL DATA:  RIGHT foot cellulitis, diabetic ulcer, fever today EXAM: CHEST 1 VIEW COMPARISON:  Portable exam 1006 hours compared to 01/23/2015 FINDINGS: RIGHT arm PICC line with tip projecting over SVC. Enlargement of cardiac silhouette. Atherosclerotic calcification aorta. Slight pulmonary vascular congestion. Bibasilar opacities question atelectasis versus infiltrate little changed from previous exam. Upper lungs clear. No gross pleural effusion or pneumothorax. BILATERAL glenohumeral degenerative changes and chronic  rotator cuff tears. IMPRESSION: Enlargement of cardiac silhouette. Bronchitic changes with bibasilar opacities question atelectasis versus consolidation, little changed. Electronically Signed   By: Lavonia Dana M.D.   On: 01/25/2015 12:10   Ct Head Wo Contrast  01/23/2015  CLINICAL DATA:  Slurred speech and decreased cognitive function recently. EXAM: CT HEAD WITHOUT CONTRAST TECHNIQUE: Contiguous axial images were obtained from the base of the skull through the vertex without intravenous contrast. COMPARISON:  None currently available FINDINGS: Skull and Sinuses:Negative for fracture or destructive process. The visualized mastoids, middle ears, and imaged paranasal sinuses are clear. Visualized orbits: Left cataract resection. Brain: No evidence of acute infarction, hemorrhage, hydrocephalus, or swelling. No extra-axial collection. There is moderate generalized atrophy with commensurate ventriculomegaly. Hazy low-density in the cerebral white matter consistent with chronic small vessel disease, with superimposed small vessel infarcts in the inferior right occipital cortex and bifrontal white matter. Lobulated soft tissue density at the upper anterior falx measuring up to 16 mm (including the superior sagittal sinus). Neighboring calcification could be dural ossification or lesional. There is no brain contact. No associated bone changes. This usually reflects meningioma in a patient without  malignancy history. IMPRESSION: 1. No acute finding. 2. Moderate atrophy and chronic small vessel disease for age. 3. 13 mm mass at the anterior falx, usually reflecting meningioma. No associated mass effect. Electronically Signed   By: Monte Fantasia M.D.   On: 01/23/2015 12:19   Mr Brain Wo Contrast  01/23/2015  CLINICAL DATA:  Multiple falls, slurred speech, decreased cognitive function. EXAM: MRI HEAD WITHOUT CONTRAST TECHNIQUE: Multiplanar, multiecho pulse sequences of the brain and surrounding structures were obtained without intravenous contrast. COMPARISON:  CT head earlier in the day. FINDINGS: No evidence for acute stroke, acute hemorrhage, or extra-axial fluid. Marked ventricular enlargement, which on first glance, has features suggestive of normal pressure hydrocephalus, with markedly dilated atria of the lateral ventricles, prominent temporal horns, and periventricular white matter signal abnormality. Given the cortical atrophy, other white matter lesions suggestive of chronic microvascular ischemic change, and chronic lacunar infarctions present, ventricular dilatation is favored to represent central atrophy and hydrocephalus ex vacuo rather than normal pressure hydrocephalus. No midline abnormality. Cervical spondylosis is suspected, with disc space narrowing and cord displacement at C4-C5. Flow voids are maintained in the carotid, basilar, and both vertebral arteries. Noncontrast examination was performed. An anterior falcine extra-axial mass, 14 x 14 mm cross-section is redemonstrated. The mass has invaded the superior sagittal sinus but otherwise appears non worrisome. No osseous erosion. This is consistent with an incidental meningioma. LEFT cataract extraction. No layering fluid in the sinuses. Unremarkable mastoids. Extracranial soft tissues unremarkable. IMPRESSION: Global atrophy with chronic microvascular ischemic change, moderately advanced. No acute intracranial findings. 14 x 14 mm  extra-axial mass in the midline frontal falx. Despite the noncontrast exam, one can be reasonably certain that this represents a meningioma, an incidental finding. Suspected cervical spondylosis, incompletely evaluated on this motion degraded brain study. When the patient is better able to hold still, and if there are signs and symptoms of cervical spondylosis or myelopathy, consider MRI cervical spine for further evaluation on an elective basis. Electronically Signed   By: Staci Righter M.D.   On: 01/23/2015 19:15   Dg Chest Port 1 View  01/23/2015  CLINICAL DATA:  Central catheter placement EXAM: PORTABLE CHEST 1 VIEW COMPARISON:  Study obtained earlier in the day FINDINGS: Central catheter tip is in the superior vena cava. No pneumothorax. There is patchy airspace consolidation in  each lung base. Lungs elsewhere clear. Heart is mildly enlarged with pulmonary vascular within normal limits. No adenopathy. There is degenerative change in each shoulder with superior migration of each humeral head. IMPRESSION: Central catheter tip in superior vena cava. No pneumothorax. Patchy infiltrate in the lung bases, likely multifocal pneumonia. Lungs otherwise clear. Stable cardiac prominence. Probable chronic rotator cuff tears bilaterally. Electronically Signed   By: Lowella Grip III M.D.   On: 01/23/2015 21:31   Dg Chest Port 1 View  01/23/2015  CLINICAL DATA:  PICC line placement.  Cellulitis. EXAM: PORTABLE CHEST 1 VIEW COMPARISON:  01/21/2015 FINDINGS: New right arm PICC line is seen with tip overlying the proximal right atrium. Low lung volumes are seen with mild bibasilar atelectasis versus developing infiltrates. No evidence of pleural effusion. Heart size is within normal limits. IMPRESSION: Right arm PICC line tip overlies the proximal right atrium. Low lung volumes with development of mild bibasilar atelectasis versus infiltrates. Electronically Signed   By: Earle Gell M.D.   On: 01/23/2015 21:12   Dg  Chest Port 1 View  01/21/2015  CLINICAL DATA:  Chest pain. Fever. Recent fall. Diabetes and hypertension. EXAM: PORTABLE CHEST 1 VIEW COMPARISON:  None. FINDINGS: The heart size and mediastinal contours are within normal limits. Both lungs are clear. No evidence of pneumothorax or pleural effusion. IMPRESSION: No active disease. Electronically Signed   By: Earle Gell M.D.   On: 01/21/2015 21:14   Dg Foot Complete Right  01/25/2015  CLINICAL DATA:  Wound with continue necrosis at medial aspect of first MTP joint along incision site, diabetic foot ulcer, hypertension, underwent irrigation and debridement of foot on 01/23/2015 EXAM: RIGHT FOOT COMPLETE - 3+ VIEW COMPARISON:  01/21/2015 FINDINGS: Dressing artifacts at medial aspect of RIGHT foot. Osseous demineralization. Joint space narrowing of first MTP joint and IP joint great toe. Decreased soft tissue swelling versus preoperative exam at medial forefoot. Scattered foci of soft tissue gas consistent with history of surgery and ulcer. No definite acute fracture, dislocation or bone destruction. Scattered small vessel vascular calcifications. IMPRESSION: Decreased soft tissue swelling at RIGHT foot with scattered foci of soft tissue gas which may be related to known ulcer or to interval surgery. No definite acute bony abnormalities. Osseous demineralization with degenerative changes at great toe. Electronically Signed   By: Lavonia Dana M.D.   On: 01/25/2015 13:39   Dg Foot Complete Right  01/21/2015  CLINICAL DATA:  Diabetic foot ulcers with pain, swelling and redness. EXAM: RIGHT FOOT COMPLETE - 3+ VIEW COMPARISON:  None. FINDINGS: Diffuse soft tissue swelling/edema involving the foot and ankle. Extensive vascular calcifications. There are moderate degenerative changes but no definite destructive bony changes to suggest osteomyelitis. Pes cavus noted. IMPRESSION: Degenerative changes but no definite plain film findings for osteomyelitis. Diffuse soft tissue  swelling/edema. Electronically Signed   By: Marijo Sanes M.D.   On: 01/21/2015 18:45   01/27/15- Op note Dr Ronalee Belts Summary: Successful recanalization of the right lower extremity with recruitment of both the anterior tibial as well as the peroneal for in-line perfusion of the right foot with the hope of limb salvage  Assessment/Plan: Keaton Randles is a 82 y.o. male with DM (A1c 6.9) associated with PN admitted with R foot infection no s/p debridement 1/13 with extensive soft tissue infection but no exposed bone. He will likely need 2-4 weeks IV therapy followed by oral tail. Cx pan sensitive staph and on Zosyn S/p successful revascularization 1/17 Cx with MSSA. Wound GS  showed GNR as well but no growth  Per Dr Vickki Muff - Planning for revascularization early next week.  May likely need re-i & D after re-vasc.  Recommendations Cont zosyn - this will cover the MSSA but also provide broader coverage to give best chance of limb salvage Agree with possible need for repeat debridement.  Thank you very much for the consult. Will follow with you.  [place], [place]   01/28/2015, 1:39 PM

## 2015-01-28 NOTE — Progress Notes (Signed)
Daily Progress Note   Subjective  - 1 Day Post-Op  F/u right foot gangrenous changes.  S/p re-vascularization.    Objective Filed Vitals:   01/27/15 2320 01/28/15 0333 01/28/15 0707 01/28/15 1500  BP: 139/50 132/47 139/53 145/47  Pulse: 81 83 83 78  Temp: 98.2 F (36.8 C) 98.7 F (37.1 C) 99.4 F (37.4 C) 99.9 F (37.7 C)  TempSrc: Oral Oral Oral Oral  Resp: 19 19 20 16   Height:      Weight:      SpO2: 98% 94% 98% 95%    Physical Exam: Marked gangrenous changes around 1st mtpj.  Plantar skin flap is necrotic.  Proximal portion of incision is stable and no purulence noted.  Laboratory CBC    Component Value Date/Time   WBC 15.3* 01/28/2015 0541   HGB 9.5* 01/28/2015 0541   HCT 30.9* 01/28/2015 0541   PLT 402 01/28/2015 0541    BMET    Component Value Date/Time   NA 137 01/28/2015 0541   K 4.0 01/28/2015 0541   CL 106 01/28/2015 0541   CO2 27 01/28/2015 0541   GLUCOSE 128* 01/28/2015 0541   BUN 16 01/28/2015 0541   CREATININE 0.82 01/28/2015 0541   CALCIUM 7.5* 01/28/2015 0541   GFRNONAA >60 01/28/2015 0541   GFRAA >60 01/28/2015 0541    Assessment/Planning: Pt with worsening gangrenous changes s/p re-vascularization.   Plan for debridement and possible 1st ray amputation vs trans-metatarsal amputation.  D/W pt and family in detail.  Will plan for Friday am.  Dressing changed today.  Samara Deist A  01/28/2015, 4:45 PM

## 2015-01-29 LAB — BASIC METABOLIC PANEL
Anion gap: 9 (ref 5–15)
BUN: 17 mg/dL (ref 6–20)
CALCIUM: 7.6 mg/dL — AB (ref 8.9–10.3)
CO2: 26 mmol/L (ref 22–32)
CREATININE: 0.82 mg/dL (ref 0.61–1.24)
Chloride: 102 mmol/L (ref 101–111)
GFR calc non Af Amer: >60 mL/min (ref >60)
GLUCOSE: 124 mg/dL — AB (ref 65–99)
Potassium: 4 mmol/L (ref 3.5–5.1)
Sodium: 137 mmol/L (ref 135–145)

## 2015-01-29 LAB — CBC
HEMATOCRIT: 29.9 % — AB (ref 40.0–52.0)
Hemoglobin: 9.3 g/dL — ABNORMAL LOW (ref 13.0–18.0)
MCH: 22.8 pg — AB (ref 26.0–34.0)
MCHC: 31.1 g/dL — AB (ref 32.0–36.0)
MCV: 73.3 fL — ABNORMAL LOW (ref 80.0–100.0)
Platelets: 403 10*3/uL (ref 150–440)
RBC: 4.08 MIL/uL — ABNORMAL LOW (ref 4.40–5.90)
RDW: 18.3 % — AB (ref 11.5–14.5)
WBC: 14.3 10*3/uL — ABNORMAL HIGH (ref 3.8–10.6)

## 2015-01-29 LAB — GLUCOSE, CAPILLARY
GLUCOSE-CAPILLARY: 125 mg/dL — AB (ref 65–99)
GLUCOSE-CAPILLARY: 280 mg/dL — AB (ref 65–99)
Glucose-Capillary: 218 mg/dL — ABNORMAL HIGH (ref 65–99)
Glucose-Capillary: 137 mg/dL — ABNORMAL HIGH (ref 65–99)

## 2015-01-29 MED ORDER — INSULIN ASPART 100 UNIT/ML ~~LOC~~ SOLN
0.0000 [IU] | Freq: Three times a day (TID) | SUBCUTANEOUS | Status: DC
  Administered 2015-01-29: 8 [IU] via SUBCUTANEOUS
  Administered 2015-01-31: 3 [IU] via SUBCUTANEOUS
  Administered 2015-01-31: 5 [IU] via SUBCUTANEOUS
  Administered 2015-01-31 – 2015-02-01 (×2): 2 [IU] via SUBCUTANEOUS
  Administered 2015-02-01: 3 [IU] via SUBCUTANEOUS
  Administered 2015-02-01: 5 [IU] via SUBCUTANEOUS
  Administered 2015-02-02: 2 [IU] via SUBCUTANEOUS
  Administered 2015-02-02: 3 [IU] via SUBCUTANEOUS
  Filled 2015-01-29: qty 5
  Filled 2015-01-29: qty 2
  Filled 2015-01-29: qty 3
  Filled 2015-01-29: qty 5
  Filled 2015-01-29 (×3): qty 2
  Filled 2015-01-29: qty 3
  Filled 2015-01-29: qty 8

## 2015-01-29 MED ORDER — INSULIN ASPART 100 UNIT/ML ~~LOC~~ SOLN
0.0000 [IU] | Freq: Every day | SUBCUTANEOUS | Status: DC
  Administered 2015-01-30 – 2015-01-31 (×2): 2 [IU] via SUBCUTANEOUS
  Filled 2015-01-29: qty 2

## 2015-01-29 NOTE — Progress Notes (Signed)
Per Dr.Kalisetti plavix held for today. Patient due to go to surgery in am.

## 2015-01-29 NOTE — Progress Notes (Signed)
Spoke with Dr.Kalisetti regarding patient's lovenox dose for this afternoon pending surgery in am. Per MD Okay to give this afternoon.

## 2015-01-29 NOTE — Progress Notes (Signed)
Inpatient Diabetes Program Recommendations  AACE/ADA: New Consensus Statement on Inpatient Glycemic Control (2015)  Target Ranges:  Prepandial:   less than 140 mg/dL      Peak postprandial:   less than 180 mg/dL (1-2 hours)      Critically ill patients:  140 - 180 mg/dL  Results for ZYIR, MACRAE (MRN [id number]) as of 01/29/2015 11:46  Ref. Range 01/28/2015 07:08 01/28/2015 10:53 01/28/2015 15:32 01/28/2015 21:04 01/29/2015 07:28 01/29/2015 11:17  Glucose-Capillary Latest Ref Range: 65-99 mg/dL 121 (H) 240 (H) 262 (H) 209 (H) 125 (H) 218 (H)   Review of Glycemic Control  Diabetes history: DM2 Outpatient Diabetes medications: Metformin 500 mg daily Current orders for Inpatient glycemic control: Novolog 0-9 units TID with meals, Novolog 0-5 units HS  Inpatient Diabetes Program Recommendations: Correction (SSI): Please consider increasing Novolog correction to moderate scale.  Thanks, Barnie Alderman, RN, MSN, CDE Diabetes Coordinator Inpatient Diabetes Program [phone] (Team Pager from [place] to [place]) [phone] ([facility] office) [phone] [facility] office) [phone] [facility] office)

## 2015-01-29 NOTE — Progress Notes (Signed)
Per RN in progression rounds patient is going to the OR tomorrow. Clinical Education officer, museum (CSW) updated Cabin crew at [facility]. CSW will continue to follow and assist as needed.   Blima Rich, LCSW [phone]

## 2015-01-29 NOTE — Progress Notes (Signed)
[place] [facility] [place] NAME: Spencer [name]    MR#:  [id number]  DATE OF BIRTH:  07/06/1917  SUBJECTIVE:  CHIEF COMPLAINT:   Chief Complaint  Patient presents with  . Fall  . Weakness   - to OR tomorrow for repeat debridement of his right foot ulcer tomorrow - Pain much improved after angiogram and intervention . - s/p angiogram with right leg PCI and intervention on 01/27/15 with significant improvement in his pain.  REVIEW OF SYSTEMS:  Review of Systems  Constitutional: Negative for fever and chills.  HENT: Positive for hearing loss. Negative for ear discharge, ear pain and nosebleeds.   Eyes: Negative for blurred vision.  Respiratory: Negative for cough, shortness of breath and wheezing.   Cardiovascular: Negative for chest pain and palpitations.  Gastrointestinal: Negative for nausea, vomiting, abdominal pain, diarrhea and constipation.  Genitourinary: Negative for dysuria.  Musculoskeletal: Negative for myalgias.       Improved right foot pain  Neurological: Positive for weakness. Negative for dizziness, tremors, speech change, focal weakness, seizures and headaches.  Psychiatric/Behavioral: Negative for depression.    DRUG ALLERGIES:  No Known Allergies  VITALS:  Blood pressure 140/56, pulse 66, temperature 98.1 F (36.7 C), temperature source Oral, resp. rate 16, height 5\' 6"  (1.676 m), weight 73.029 kg (161 lb), SpO2 99 %.  PHYSICAL EXAMINATION:  Physical Exam  GENERAL:  82 y.o.-year-old patient lying in the bed with no acute distress. Hard of hearing. EYES: Pupils equal, round, reactive to light and accommodation. No scleral icterus. Extraocular muscles intact.  HEENT: Head atraumatic, normocephalic. Oropharynx and nasopharynx clear.  NECK:  Supple, no jugular venous distention. No thyroid enlargement, no tenderness.  LUNGS: Normal breath sounds bilaterally, no wheezing, rales,rhonchi or crepitation. No use of  accessory muscles of respiration.  CARDIOVASCULAR: S1, S2 normal. No rubs, or gallops. Loud 3/6 systolic murmur present all over the precordium.  ABDOMEN: Soft, nontender, nondistended. Bowel sounds present. No organomegaly or mass.  EXTREMITIES: No pedal edema, cyanosis, or clubbing. Right foot dressing is present. NEUROLOGIC: Cranial nerves II through XII are intact. Muscle strength 5/5 in all extremities. Sensation intact. Gait not checked.  PSYCHIATRIC: The patient is alert and oriented x 3.  SKIN: No obvious rash, lesion, or ulcer. Other than right foot ulcer medially- wrapped now.   LABORATORY PANEL:   CBC  Recent Labs Lab 01/29/15 0515  WBC 14.3*  HGB 9.3*  HCT 29.9*  PLT 403   ------------------------------------------------------------------------------------------------------------------  Chemistries   Recent Labs Lab 01/24/15 0329  01/29/15 0515  NA 140  < > 137  K 3.7  < > 4.0  CL 109  < > 102  CO2 25  < > 26  GLUCOSE 136*  < > 124*  BUN 21*  < > 17  CREATININE 0.81  < > 0.82  CALCIUM 7.7*  < > 7.6*  MG 2.3  --   --   < > = values in this interval not displayed. ------------------------------------------------------------------------------------------------------------------  Cardiac Enzymes No results for input(s): TROPONINI in the last 168 hours. ------------------------------------------------------------------------------------------------------------------  RADIOLOGY:  No results found.  EKG:   Orders placed or performed during the hospital encounter of 01/21/15  . EKG 12-Lead  . EKG 12-Lead    ASSESSMENT AND PLAN:   82 year old male with past medical history of diabetes, hypertension, diabetic neuropathy, hyperlipidemia, history of arthritis of presents to the hospital due to falls and also noted to have a right foot cellulitis  and diabetic foot ulcer.  1 Right foot cellulitis with diabetic foot ulcer/abscess: after a trauma - appreciate  podiatry consult and had debridement done after admission- for repeat debridement tomorrow - appreciate vascular consult- s/p angiogram and PCI intervention to right leg - Wound cultures growing MSSA. Patient is currently on Zosyn. - ID following -Patient's x-rays negative for any evidence of osteomyelitis - pain meds prn  2 MSSA Sepsis, present on admission: Continue IV zosyn as mentioned above.  Appreciate ID consult- after repeat debridement check with ID about the duration of treatment- has a PICC line in already  4 diabetes type 2 without complication: Continue sliding scale insulin and ADA diet. Metformin is on hold for angiogram.  5 Depression - cont. Cymbalta.   6 Hyperlipidemia - cont. Simvastatin.   7. New onset atrial fibrillation: in afib - rate controlled -Cardiology recommended using anticoagulation. No need to bridge now. - start  ELIQUIS at discharge- will hold off for now as patient might need debridement again.   8. . Meningioma: Patient reports that he has known about this meningioma.  MRI of brain was negative for stroke. No further workup for meningioma  9. DVT Prophylaxis- on lovenox  PT recommended SNF- from [facility] once foot procedures are finished.   All the records are reviewed and case discussed with Care Management/Social Workerr. Management plans discussed with the patient, family and they are in agreement.  CODE STATUS: Full Code  TOTAL TIME TAKING CARE OF THIS PATIENT: 38 minutes.   POSSIBLE D/C IN 3-4 DAYS, DEPENDING ON CLINICAL CONDITION.   Gladstone Lighter M.D on 01/29/2015 at 2:22 PM  Between 7am to 6pm - Pager - [phone]  After 6pm go to [email or web address] - password [email or web address] [facility]  [facility]  Office  [phone]  CC: Primary care physician; [name], MD

## 2015-01-30 ENCOUNTER — Encounter
Admission: EM | Disposition: A | Discharge status: Skilled Nursing Facility | Payer: Self-pay | Source: Home / Self Care | Attending: Internal Medicine

## 2015-01-30 ENCOUNTER — Inpatient Hospital Stay: Payer: Medicare Other | Admitting: Anesthesiology

## 2015-01-30 HISTORY — PX: AMPUTATION: SHX166

## 2015-01-30 LAB — GLUCOSE, CAPILLARY
GLUCOSE-CAPILLARY: 100 mg/dL — AB (ref 65–99)
GLUCOSE-CAPILLARY: 103 mg/dL — AB (ref 65–99)
GLUCOSE-CAPILLARY: 103 mg/dL — AB (ref 65–99)
GLUCOSE-CAPILLARY: 211 mg/dL — AB (ref 65–99)
Glucose-Capillary: 117 mg/dL — ABNORMAL HIGH (ref 65–99)

## 2015-01-30 SURGERY — AMPUTATION, FOOT, PARTIAL
Anesthesia: General | Laterality: Right

## 2015-01-30 MED ORDER — LIDOCAINE HCL 1 % IJ SOLN
INTRAMUSCULAR | Status: DC | PRN
  Administered 2015-01-30: 10 mL

## 2015-01-30 MED ORDER — ACETAMINOPHEN 10 MG/ML IV SOLN
INTRAVENOUS | Status: DC | PRN
  Administered 2015-01-30: 1000 mg via INTRAVENOUS

## 2015-01-30 MED ORDER — LIDOCAINE HCL (PF) 1 % IJ SOLN
INTRAMUSCULAR | Status: AC
  Filled 2015-01-30: qty 30

## 2015-01-30 MED ORDER — ONDANSETRON HCL 4 MG/2ML IJ SOLN
INTRAMUSCULAR | Status: DC | PRN
  Administered 2015-01-30: 4 mg via INTRAVENOUS

## 2015-01-30 MED ORDER — ACETAMINOPHEN 10 MG/ML IV SOLN
INTRAVENOUS | Status: AC
  Filled 2015-01-30: qty 100

## 2015-01-30 MED ORDER — LIDOCAINE HCL (CARDIAC) 20 MG/ML IV SOLN
INTRAVENOUS | Status: DC | PRN
  Administered 2015-01-30: 40 mg via INTRAVENOUS

## 2015-01-30 MED ORDER — PROPOFOL 500 MG/50ML IV EMUL
INTRAVENOUS | Status: DC | PRN
  Administered 2015-01-30: 50 ug/kg/min via INTRAVENOUS

## 2015-01-30 MED ORDER — BUPIVACAINE HCL (PF) 0.5 % IJ SOLN
INTRAMUSCULAR | Status: AC
  Filled 2015-01-30: qty 30

## 2015-01-30 MED ORDER — PROPOFOL 10 MG/ML IV BOLUS
INTRAVENOUS | Status: DC | PRN
  Administered 2015-01-30: 30 mg via INTRAVENOUS

## 2015-01-30 MED ORDER — SODIUM CHLORIDE 0.9 % IV SOLN
INTRAVENOUS | Status: DC
Start: 2015-01-30 — End: 2015-02-02
  Administered 2015-01-30 (×2): via INTRAVENOUS

## 2015-01-30 MED ORDER — FENTANYL CITRATE (PF) 100 MCG/2ML IJ SOLN
INTRAMUSCULAR | Status: DC | PRN
  Administered 2015-01-30 (×4): 25 ug via INTRAVENOUS

## 2015-01-30 MED ORDER — SODIUM CHLORIDE 0.9 % IR SOLN
Status: DC | PRN
  Administered 2015-01-30: 700 mL

## 2015-01-30 MED ORDER — BUPIVACAINE HCL 0.5 % IJ SOLN
INTRAMUSCULAR | Status: DC | PRN
  Administered 2015-01-30 (×2): 10 mL

## 2015-01-30 MED ORDER — LIDOCAINE-EPINEPHRINE 1 %-1:100000 IJ SOLN
INTRAMUSCULAR | Status: AC
  Filled 2015-01-30: qty 1

## 2015-01-30 MED ORDER — VANCOMYCIN HCL 1000 MG IV SOLR
INTRAVENOUS | Status: AC
  Filled 2015-01-30: qty 1000

## 2015-01-30 SURGICAL SUPPLY — 63 items
#10 SURGICAL BLADE ×4 IMPLANT
BANDAGE ACE 4X5 VEL STRL LF (GAUZE/BANDAGES/DRESSINGS) IMPLANT
BANDAGE STRETCH 3X4.1 STRL (GAUZE/BANDAGES/DRESSINGS) ×2 IMPLANT
BLADE MED AGGRESSIVE (BLADE) ×2 IMPLANT
BLADE OSC/SAGITTAL MD 5.5X18 (BLADE) IMPLANT
BLADE OSCILLATING/SAGITTAL (BLADE)
BLADE SW THK.38XMED LNG THN (BLADE) IMPLANT
BNDG COHESIVE 4X5 TAN STRL (GAUZE/BANDAGES/DRESSINGS) ×2 IMPLANT
BNDG COHESIVE 6X5 TAN STRL LF (GAUZE/BANDAGES/DRESSINGS) IMPLANT
BNDG ESMARK 4X12 TAN STRL LF (GAUZE/BANDAGES/DRESSINGS) ×2 IMPLANT
BNDG GAUZE 4.5X4.1 6PLY STRL (MISCELLANEOUS) ×2 IMPLANT
CANISTER SUCT 1200ML W/VALVE (MISCELLANEOUS) ×4 IMPLANT
CANISTER SUCT 3000ML (MISCELLANEOUS) IMPLANT
CUFF TOURN 18 STER (MISCELLANEOUS) IMPLANT
CUFF TOURN DUAL PL 12 NO SLV (MISCELLANEOUS) ×2 IMPLANT
DRAPE FLUOR MINI C-ARM 54X84 (DRAPES) ×2 IMPLANT
DRAPE XRAY CASSETTE 23X24 (DRAPES) IMPLANT
DRESSING ALLEVYN 4X4 (MISCELLANEOUS) ×6 IMPLANT
DRSG TEGADERM 2-3/8X2-3/4 SM (GAUZE/BANDAGES/DRESSINGS) ×2 IMPLANT
DURAPREP 26ML APPLICATOR (WOUND CARE) IMPLANT
ELECT REM PT RETURN 9FT ADLT (ELECTROSURGICAL) ×2
ELECTRODE REM PT RTRN 9FT ADLT (ELECTROSURGICAL) ×1 IMPLANT
GAUZE PACKING 1/4X5YD (GAUZE/BANDAGES/DRESSINGS) IMPLANT
GAUZE PACKING IODOFORM 1X5 (MISCELLANEOUS) IMPLANT
GAUZE PETRO XEROFOAM 1X8 (MISCELLANEOUS) ×2 IMPLANT
GAUZE SPONGE 4X4 12PLY STRL (GAUZE/BANDAGES/DRESSINGS) ×2 IMPLANT
GAUZE STRETCH 2X75IN STRL (MISCELLANEOUS) ×2 IMPLANT
GLOVE BIO SURGEON STRL SZ7.5 (GLOVE) ×6 IMPLANT
GLOVE INDICATOR 8.0 STRL GRN (GLOVE) ×6 IMPLANT
GOWN STRL REUS W/TWL MED LVL3 (GOWN DISPOSABLE) ×6 IMPLANT
HANDPIECE SUCTION TUBG SURGILV (MISCELLANEOUS) IMPLANT
HANDPIECE VERSAJET DEBRIDEMENT (MISCELLANEOUS) ×2 IMPLANT
IV NS 1000ML (IV SOLUTION) ×1
IV NS 1000ML BAXH (IV SOLUTION) ×1 IMPLANT
KIT PREVENA INCISION MGT 13 (CANNISTER) IMPLANT
KIT RM TURNOVER STRD PROC AR (KITS) ×2 IMPLANT
LABEL OR SOLS (LABEL) ×2 IMPLANT
NDL SAFETY ECLIPSE 18X1.5 (NEEDLE) ×1 IMPLANT
NEEDLE FILTER BLUNT 18X 1/2SAF (NEEDLE)
NEEDLE FILTER BLUNT 18X1 1/2 (NEEDLE) IMPLANT
NEEDLE HYPO 18GX1.5 SHARP (NEEDLE) ×1
NEEDLE HYPO 25X1 1.5 SAFETY (NEEDLE) ×4 IMPLANT
NS IRRIG 500ML POUR BTL (IV SOLUTION) ×4 IMPLANT
PACK EXTREMITY ARMC (MISCELLANEOUS) ×2 IMPLANT
PAD ABD DERMACEA PRESS 5X9 (GAUZE/BANDAGES/DRESSINGS) ×4 IMPLANT
RASP SM TEAR CROSS CUT (RASP) IMPLANT
SOL .9 NS 3000ML IRR  AL (IV SOLUTION)
SOL .9 NS 3000ML IRR UROMATIC (IV SOLUTION) IMPLANT
SOL PREP PVP 2OZ (MISCELLANEOUS) ×2
SOLUTION PREP PVP 2OZ (MISCELLANEOUS) ×1 IMPLANT
SPONGE LAP 18X18 5 PK (GAUZE/BANDAGES/DRESSINGS) ×2 IMPLANT
STOCKINETTE IMPERVIOUS 9X36 MD (GAUZE/BANDAGES/DRESSINGS) ×2 IMPLANT
SUT ETHILON 2 0 FS 18 (SUTURE) ×12 IMPLANT
SUT ETHILON 4-0 (SUTURE)
SUT ETHILON 4-0 FS2 18XMFL BLK (SUTURE)
SUT VIC AB 3-0 SH 27 (SUTURE) ×2
SUT VIC AB 3-0 SH 27X BRD (SUTURE) ×2 IMPLANT
SUT VIC AB 4-0 FS2 27 (SUTURE) IMPLANT
SUTURE ETHLN 4-0 FS2 18XMF BLK (SUTURE) IMPLANT
SWAB CULTURE AMIES ANAERIB BLU (MISCELLANEOUS) IMPLANT
SYR 3ML LL SCALE MARK (SYRINGE) IMPLANT
SYRINGE 10CC LL (SYRINGE) ×6 IMPLANT
TOWEL OR 17X26 4PK STRL BLUE (TOWEL DISPOSABLE) ×4 IMPLANT

## 2015-01-30 NOTE — Progress Notes (Signed)
Nutrition Follow-up   INTERVENTION:   Meals and Snacks: Cater to patient preferences; continue snacks as ordered Medical Food Supplement Therapy: Continue Ensure as ordered, will send Magic Cup  Coordination of Care: will recommend collecting daily weights   NUTRITION DIAGNOSIS:   Inadequate oral intake related to acute illness as evidenced by per patient/family report.  GOAL:   Patient will meet greater than or equal to 90% of their needs; ongoing  MONITOR:    (Energy Intake, Electrolyte and Renal Profile, Anthropometrics, Digestive system, Glucose Profile)  REASON FOR ASSESSMENT:   Malnutrition Screening Tool    ASSESSMENT:   Pt admitted with right foot cellulitis seconary to diabetic foot ulcer and sepsis. Pt HOH.  Pt out of room this am for debridement and possible transmetatarsal amputation today.  Diet Order:  Diet Heart Room service appropriate?: Yes; Fluid consistency:: Thin    Current Nutrition: Recorded po intake bites of meals yesterday, 100% of dinner on 1/17 and 100% of breakfast 1/18. Pt NPO since last night at midnight.    Gastrointestinal Profile: Last BM: 01/29/2015   Scheduled Medications:  . [MAR Hold] aspirin EC  81 mg Oral Daily  . [MAR Hold] clopidogrel  75 mg Oral Q breakfast  . [MAR Hold] docusate sodium  100 mg Oral Daily  . [MAR Hold] DULoxetine  20 mg Oral Daily  . [MAR Hold] enoxaparin (LOVENOX) injection  40 mg Subcutaneous Q24H  . [MAR Hold] feeding supplement (ENSURE ENLIVE)  237 mL Oral TID WC  . [MAR Hold] furosemide  40 mg Oral Daily  . [MAR Hold] insulin aspart  0-15 Units Subcutaneous TID WC  . [MAR Hold] insulin aspart  0-5 Units Subcutaneous QHS  . [MAR Hold] piperacillin-tazobactam (ZOSYN)  IV  3.375 g Intravenous 3 times per day  . [MAR Hold] simvastatin  20 mg Oral Daily  . [facility] Hold] sodium chloride  3 mL Intravenous Q8H    Continuous Medications:  . sodium chloride 0 mL/hr at 01/30/15 1219     Electrolyte/Renal  Profile and Glucose Profile:   Recent Labs Lab 01/23/15 1620 01/24/15 0329  01/27/15 0450 01/28/15 0541 01/29/15 0515  NA  --  140  < > 134* 137 137  K  --  3.7  < > 3.2* 4.0 4.0  CL  --  109  < > 100* 106 102  CO2  --  25  < > 26 27 26   BUN  --  21*  < > 18 16 17   CREATININE  --  0.81  < > 0.80 0.82 0.82  CALCIUM  --  7.7*  < > 7.3* 7.5* 7.6*  MG  --  2.3  --   --   --   --   PHOS 2.3*  --   --   --   --   --   GLUCOSE  --  136*  < > 116* 128* 124*  < > = values in this interval not displayed. Protein Profile: No results for input(s): ALBUMIN in the last 168 hours.    Weight Trend since Admission: Filed Weights   01/21/15 2119 01/23/15 0929 01/30/15 0844  Weight: 161 lb 12.8 oz (73.392 kg) 161 lb (73.029 kg) 161 lb (73.029 kg)      BMI:  Body mass index is 26 kg/(m^2).  Estimated Nutritional Needs:   Kcal:  BEE: 1302kcals, TEE: (IF 1.1-1.3)(AF 1.2) 1720-2023kcals  Protein:  73-88g protein (1.0-1.2g/kg)  Fluid:  1825-2164mL of fluid (25-71mL/kg)  EDUCATION NEEDS:  No education needs identified at this time   Clawson, RD, LDN Pager [phone] Weekend/On-Call Pager [phone]

## 2015-01-30 NOTE — Progress Notes (Signed)
Patient received from PACU s/p right foot metatarsals amputation, dressing to right foot with serous drainage noted, right foot dressing reinforced with abd and gauze. Vital sign assessed. Will continue to monitor the patient.

## 2015-01-30 NOTE — Progress Notes (Signed)
[place] INFECTIOUS DISEASE PROGRESS NOTE Date of Admission:  01/21/2015     ID: Spencer [name] is a 82 y.o. male with  Le abscess, DM and PVD Active Problems:   Cellulitis   Subjective: S/p TMA 1/20.    ROS  Eleven systems are reviewed and negative except per hpi  Medications:  Antibiotics Given (last 72 hours)    Date/Time Action Medication Dose Rate   01/27/15 2209 Given   piperacillin-tazobactam (ZOSYN) IVPB 3.375 g 3.375 g 12.5 mL/hr   01/28/15 [id number] Given   piperacillin-tazobactam (ZOSYN) IVPB 3.375 g 3.375 g 12.5 mL/hr   01/28/15 1316 Given   piperacillin-tazobactam (ZOSYN) IVPB 3.375 g 3.375 g 12.5 mL/hr   01/28/15 2139 Given   piperacillin-tazobactam (ZOSYN) IVPB 3.375 g 3.375 g 12.5 mL/hr   01/29/15 0545 Given   piperacillin-tazobactam (ZOSYN) IVPB 3.375 g 3.375 g 12.5 mL/hr   01/29/15 1413 Given   piperacillin-tazobactam (ZOSYN) IVPB 3.375 g 3.375 g 12.5 mL/hr   01/29/15 2057 Given   piperacillin-tazobactam (ZOSYN) IVPB 3.375 g 3.375 g 12.5 mL/hr   01/30/15 0529 Given   piperacillin-tazobactam (ZOSYN) IVPB 3.375 g 3.375 g 12.5 mL/hr     . aspirin EC  81 mg Oral Daily  . clopidogrel  75 mg Oral Q breakfast  . docusate sodium  100 mg Oral Daily  . DULoxetine  20 mg Oral Daily  . enoxaparin (LOVENOX) injection  40 mg Subcutaneous Q24H  . feeding supplement (ENSURE ENLIVE)  237 mL Oral TID WC  . furosemide  40 mg Oral Daily  . insulin aspart  0-15 Units Subcutaneous TID WC  . insulin aspart  0-5 Units Subcutaneous QHS  . piperacillin-tazobactam (ZOSYN)  IV  3.375 g Intravenous 3 times per day  . simvastatin  20 mg Oral Daily  . sodium chloride  3 mL Intravenous Q8H    Objective: Vital signs in last 24 hours: Temp:  [96.1 F (35.6 C)-99.1 F (37.3 C)] 98.1 F (36.7 C) (01/20 1341) Pulse Rate:  [68-88] 73 (01/20 1341) Resp:  [12-20] 16 (01/20 1341) BP: (131-159)/(51-75) 141/65 mmHg (01/20 1341) SpO2:  [91 %-100 %] 93 % (01/20 1341) Weight:   [73.029 kg (161 lb)] 73.029 kg (161 lb) (01/20 0844) Constitutional: He is oriented to person, place, and time. Frail HENT: perrla  Mouth/Throat: Oropharynx is clear and dry. No oropharyngeal exudate.  Cardiovascular: Normal rate, 3/6 sm  Pulmonary/Chest: bil rhonchi  Abdominal: Soft. Bowel sounds are normal. He exhibits no distension. There is no tenderness.  Lymphadenopathy:  He has no cervical adenopathy.  Neurological: He is alert and oriented to person, place, and time.  Skin: R foot wrapped Psychiatric: He has a normal mood and affect. His behavior is normal.   Lab Results  Recent Labs  01/28/15 0541 01/29/15 0515  WBC 15.3* 14.3*  HGB 9.5* 9.3*  HCT 30.9* 29.9*  NA 137 137  K 4.0 4.0  CL 106 102  CO2 27 26  BUN 16 17  CREATININE 0.82 0.82    Microbiology: Results for orders placed or performed during the hospital encounter of 01/21/15  Culture, blood (routine x 2)     Status: None   Collection Time: 01/21/15  6:48 PM  Result Value Ref Range Status   Specimen Description BLOOD RIGHT WRIST  Final   Special Requests [facility]  Final   Culture NO GROWTH 6 DAYS  Final   Report Status 01/27/2015 FINAL  Final  Culture, blood (routine x 2)  Status: None   Collection Time: 01/21/15  6:48 PM  Result Value Ref Range Status   Specimen Description BLOOD LEFT ARM  Final   Special Requests BAA,ANA,AER,7ML  Final   Culture NO GROWTH 6 DAYS  Final   Report Status 01/27/2015 FINAL  Final  Wound culture     Status: None   Collection Time: 01/22/15  5:50 PM  Result Value Ref Range Status   Specimen Description ABSCESS  Final   Special Requests Immunocompromised  Final   Gram Stain RARE WBC SEEN FEW GRAM POSITIVE COCCI   Final   Culture RARE GROWTH STAPHYLOCOCCUS AUREUS  Final   Report Status 01/26/2015 FINAL  Final   Organism ID, Bacteria STAPHYLOCOCCUS AUREUS  Final      Susceptibility   Staphylococcus aureus - MIC*    CIPROFLOXACIN <=0.5 SENSITIVE  Sensitive     GENTAMICIN <=0.5 SENSITIVE Sensitive     OXACILLIN <=0.25 SENSITIVE Sensitive     TRIMETH/SULFA <=10 SENSITIVE Sensitive     CEFOXITIN SCREEN NEGATIVE Sensitive     Inducible Clindamycin NEGATIVE Sensitive     TETRACYCLINE Value in next row Sensitive      SENSITIVE<=1    * RARE GROWTH STAPHYLOCOCCUS AUREUS  Anaerobic culture     Status: None   Collection Time: 01/23/15 10:32 AM  Result Value Ref Range Status   Specimen Description WOUND  Final   Special Requests NONE  Final   Culture NO ANAEROBES ISOLATED  Final   Report Status 01/27/2015 FINAL  Final  Culture, routine-abscess     Status: None   Collection Time: 01/23/15 10:32 AM  Result Value Ref Range Status   Specimen Description WOUND  Final   Special Requests NONE  Final   Gram Stain   Final    FEW WBC SEEN FEW GRAM NEGATIVE RODS RARE GRAM POSITIVE COCCI IN PAIRS    Culture LIGHT GROWTH STAPHYLOCOCCUS AUREUS  Final   Report Status 01/26/2015 FINAL  Final   Organism ID, Bacteria STAPHYLOCOCCUS AUREUS  Final      Susceptibility   Staphylococcus aureus - MIC*    CIPROFLOXACIN <=0.5 SENSITIVE Sensitive     ERYTHROMYCIN <=0.25 SENSITIVE Sensitive     GENTAMICIN <=0.5 SENSITIVE Sensitive     OXACILLIN <=0.25 SENSITIVE Sensitive     TRIMETH/SULFA <=10 SENSITIVE Sensitive     CLINDAMYCIN <=0.25 SENSITIVE Sensitive     CEFOXITIN SCREEN NEGATIVE Sensitive     Inducible Clindamycin NEGATIVE Sensitive     TETRACYCLINE Value in next row Sensitive      SENSITIVE<=1    RIFAMPIN Value in next row Sensitive      SENSITIVE<=0.5    VANCOMYCIN Value in next row Sensitive      SENSITIVE1    * LIGHT GROWTH STAPHYLOCOCCUS AUREUS    Studies/Results: Dg Chest 1 View  01/25/2015  CLINICAL DATA:  RIGHT foot cellulitis, diabetic ulcer, fever today EXAM: CHEST 1 VIEW COMPARISON:  Portable exam 1006 hours compared to 01/23/2015 FINDINGS: RIGHT arm PICC line with tip projecting over SVC. Enlargement of cardiac silhouette.  Atherosclerotic calcification aorta. Slight pulmonary vascular congestion. Bibasilar opacities question atelectasis versus infiltrate little changed from previous exam. Upper lungs clear. No gross pleural effusion or pneumothorax. BILATERAL glenohumeral degenerative changes and chronic rotator cuff tears. IMPRESSION: Enlargement of cardiac silhouette. Bronchitic changes with bibasilar opacities question atelectasis versus consolidation, little changed. Electronically Signed   By: Lavonia Dana M.D.   On: 01/25/2015 12:10   Ct Head Wo Contrast  01/23/2015  CLINICAL DATA:  Slurred speech and decreased cognitive function recently. EXAM: CT HEAD WITHOUT CONTRAST TECHNIQUE: Contiguous axial images were obtained from the base of the skull through the vertex without intravenous contrast. COMPARISON:  None currently available FINDINGS: Skull and Sinuses:Negative for fracture or destructive process. The visualized mastoids, middle ears, and imaged paranasal sinuses are clear. Visualized orbits: Left cataract resection. Brain: No evidence of acute infarction, hemorrhage, hydrocephalus, or swelling. No extra-axial collection. There is moderate generalized atrophy with commensurate ventriculomegaly. Hazy low-density in the cerebral white matter consistent with chronic small vessel disease, with superimposed small vessel infarcts in the inferior right occipital cortex and bifrontal white matter. Lobulated soft tissue density at the upper anterior falx measuring up to 16 mm (including the superior sagittal sinus). Neighboring calcification could be dural ossification or lesional. There is no brain contact. No associated bone changes. This usually reflects meningioma in a patient without malignancy history. IMPRESSION: 1. No acute finding. 2. Moderate atrophy and chronic small vessel disease for age. 3. 13 mm mass at the anterior falx, usually reflecting meningioma. No associated mass effect. Electronically Signed   By: Monte Fantasia M.D.   On: 01/23/2015 12:19   Mr Brain Wo Contrast  01/23/2015  CLINICAL DATA:  Multiple falls, slurred speech, decreased cognitive function. EXAM: MRI HEAD WITHOUT CONTRAST TECHNIQUE: Multiplanar, multiecho pulse sequences of the brain and surrounding structures were obtained without intravenous contrast. COMPARISON:  CT head earlier in the day. FINDINGS: No evidence for acute stroke, acute hemorrhage, or extra-axial fluid. Marked ventricular enlargement, which on first glance, has features suggestive of normal pressure hydrocephalus, with markedly dilated atria of the lateral ventricles, prominent temporal horns, and periventricular white matter signal abnormality. Given the cortical atrophy, other white matter lesions suggestive of chronic microvascular ischemic change, and chronic lacunar infarctions present, ventricular dilatation is favored to represent central atrophy and hydrocephalus ex vacuo rather than normal pressure hydrocephalus. No midline abnormality. Cervical spondylosis is suspected, with disc space narrowing and cord displacement at C4-C5. Flow voids are maintained in the carotid, basilar, and both vertebral arteries. Noncontrast examination was performed. An anterior falcine extra-axial mass, 14 x 14 mm cross-section is redemonstrated. The mass has invaded the superior sagittal sinus but otherwise appears non worrisome. No osseous erosion. This is consistent with an incidental meningioma. LEFT cataract extraction. No layering fluid in the sinuses. Unremarkable mastoids. Extracranial soft tissues unremarkable. IMPRESSION: Global atrophy with chronic microvascular ischemic change, moderately advanced. No acute intracranial findings. 14 x 14 mm extra-axial mass in the midline frontal falx. Despite the noncontrast exam, one can be reasonably certain that this represents a meningioma, an incidental finding. Suspected cervical spondylosis, incompletely evaluated on this motion degraded brain  study. When the patient is better able to hold still, and if there are signs and symptoms of cervical spondylosis or myelopathy, consider MRI cervical spine for further evaluation on an elective basis. Electronically Signed   By: Staci Righter M.D.   On: 01/23/2015 19:15   Dg Chest Port 1 View  01/23/2015  CLINICAL DATA:  Central catheter placement EXAM: PORTABLE CHEST 1 VIEW COMPARISON:  Study obtained earlier in the day FINDINGS: Central catheter tip is in the superior vena cava. No pneumothorax. There is patchy airspace consolidation in each lung base. Lungs elsewhere clear. Heart is mildly enlarged with pulmonary vascular within normal limits. No adenopathy. There is degenerative change in each shoulder with superior migration of each humeral head. IMPRESSION: Central catheter tip in superior vena cava. No pneumothorax.  Patchy infiltrate in the lung bases, likely multifocal pneumonia. Lungs otherwise clear. Stable cardiac prominence. Probable chronic rotator cuff tears bilaterally. Electronically Signed   By: Lowella Grip III M.D.   On: 01/23/2015 21:31   Dg Chest Port 1 View  01/23/2015  CLINICAL DATA:  PICC line placement.  Cellulitis. EXAM: PORTABLE CHEST 1 VIEW COMPARISON:  01/21/2015 FINDINGS: New right arm PICC line is seen with tip overlying the proximal right atrium. Low lung volumes are seen with mild bibasilar atelectasis versus developing infiltrates. No evidence of pleural effusion. Heart size is within normal limits. IMPRESSION: Right arm PICC line tip overlies the proximal right atrium. Low lung volumes with development of mild bibasilar atelectasis versus infiltrates. Electronically Signed   By: Earle Gell M.D.   On: 01/23/2015 21:12   Dg Chest Port 1 View  01/21/2015  CLINICAL DATA:  Chest pain. Fever. Recent fall. Diabetes and hypertension. EXAM: PORTABLE CHEST 1 VIEW COMPARISON:  None. FINDINGS: The heart size and mediastinal contours are within normal limits. Both lungs are  clear. No evidence of pneumothorax or pleural effusion. IMPRESSION: No active disease. Electronically Signed   By: Earle Gell M.D.   On: 01/21/2015 21:14   Dg Foot Complete Right  01/25/2015  CLINICAL DATA:  Wound with continue necrosis at medial aspect of first MTP joint along incision site, diabetic foot ulcer, hypertension, underwent irrigation and debridement of foot on 01/23/2015 EXAM: RIGHT FOOT COMPLETE - 3+ VIEW COMPARISON:  01/21/2015 FINDINGS: Dressing artifacts at medial aspect of RIGHT foot. Osseous demineralization. Joint space narrowing of first MTP joint and IP joint great toe. Decreased soft tissue swelling versus preoperative exam at medial forefoot. Scattered foci of soft tissue gas consistent with history of surgery and ulcer. No definite acute fracture, dislocation or bone destruction. Scattered small vessel vascular calcifications. IMPRESSION: Decreased soft tissue swelling at RIGHT foot with scattered foci of soft tissue gas which may be related to known ulcer or to interval surgery. No definite acute bony abnormalities. Osseous demineralization with degenerative changes at great toe. Electronically Signed   By: Lavonia Dana M.D.   On: 01/25/2015 13:39   Dg Foot Complete Right  01/21/2015  CLINICAL DATA:  Diabetic foot ulcers with pain, swelling and redness. EXAM: RIGHT FOOT COMPLETE - 3+ VIEW COMPARISON:  None. FINDINGS: Diffuse soft tissue swelling/edema involving the foot and ankle. Extensive vascular calcifications. There are moderate degenerative changes but no definite destructive bony changes to suggest osteomyelitis. Pes cavus noted. IMPRESSION: Degenerative changes but no definite plain film findings for osteomyelitis. Diffuse soft tissue swelling/edema. Electronically Signed   By: Marijo Sanes M.D.   On: 01/21/2015 18:45   01/27/15- Op note Dr Ronalee Belts Summary: Successful recanalization of the right lower extremity with recruitment of both the anterior tibial as well as the  peroneal for in-line perfusion of the right foot with the hope of limb salvage  Assessment/Plan: Faolan Honkomp is a 82 y.o. male with DM (A1c 6.9) associated with PN admitted with R foot infection no s/p debridement 1/13 with extensive soft tissue infection but no exposed bone. He will likely need 2-4 weeks IV therapy followed by oral tail. Cx pan sensitive staph and on Zosyn S/p successful revascularization 1/17 and TMA 1/20. Cx with MSSA. Wound GS showed GNR as well but no growth  Recommendations Cont zosyn - this will cover the MSSA but also provide broader coverage to give best chance of limb salvage Rec 2 week course from TMA 1/20-2/3 I can see  in fu at that time to see if needs to extend abx or change to oral.  Thank you very much for the consult. Will follow with you.  [place], [place]   01/30/2015, 1:59 PM

## 2015-01-30 NOTE — Progress Notes (Signed)
[place] [facility] [place] NAME: Spencer Spencer [name]    MR#:  [id number]  DATE OF BIRTH:  23-Jun-1917  SUBJECTIVE:  S/p Transmetatarsal amputation right foot  REVIEW OF SYSTEMS:  Review of Systems  Constitutional: Negative for fever and chills.  HENT: Positive for hearing loss. Negative for ear discharge, ear pain and nosebleeds.   Eyes: Negative for blurred vision.  Respiratory: Negative for cough, shortness of breath and wheezing.   Cardiovascular: Negative for chest pain and palpitations.  Gastrointestinal: Negative for nausea, vomiting, abdominal pain, diarrhea and constipation.  Genitourinary: Negative for dysuria.  Musculoskeletal: Negative for myalgias.       Improved right foot pain  Neurological: Positive for weakness. Negative for dizziness, tremors, speech change, focal weakness, seizures and headaches.  Psychiatric/Behavioral: Negative for depression.    DRUG ALLERGIES:  No Known Allergies  VITALS:  Blood pressure 136/60, pulse 71, temperature 97.3 F (36.3 C), temperature source Oral, resp. rate 15, height 5\' 6"  (1.676 m), weight 73.029 kg (161 lb), SpO2 96 %.  PHYSICAL EXAMINATION:  Physical Exam  Constitutional: Spencer Spencer [name] is oriented to person, place, and time and well-developed, well-nourished, and in no distress. No distress.  HENT:  Head: Normocephalic.  Eyes: No scleral icterus.  Neck: Normal range of motion. Neck supple. No JVD present. No tracheal deviation present.  Cardiovascular: Normal rate, regular rhythm and normal heart sounds.  Exam reveals no gallop and no friction rub.   No murmur heard. Pulmonary/Chest: Effort normal and breath sounds normal. No respiratory distress. Spencer Spencer [name] has no wheezes. Spencer Spencer [name] has no rales. Spencer Spencer [name] exhibits no tenderness.  Abdominal: Soft. Bowel sounds are normal. Spencer Spencer [name] exhibits no distension and no mass. There is no tenderness. There is no rebound and no guarding.  Musculoskeletal: Normal range of motion.  Spencer Spencer [name] exhibits no edema.  Neurological: Spencer Spencer [name] is alert and oriented to person, place, and time.  Skin: Skin is warm. No rash noted. No erythema.  Foot wrapped  Psychiatric: Affect and judgment normal.      LABORATORY PANEL:   CBC  Recent Labs Lab 01/29/15 0515  WBC 14.3*  HGB 9.3*  HCT 29.9*  PLT 403   ------------------------------------------------------------------------------------------------------------------  Chemistries   Recent Labs Lab 01/24/15 0329  01/29/15 0515  NA 140  < > 137  K 3.7  < > 4.0  CL 109  < > 102  CO2 Spencer  < > 26  GLUCOSE 136*  < > 124*  BUN 21*  < > 17  CREATININE 0.81  < > 0.82  CALCIUM 7.7*  < > 7.6*  MG 2.3  --   --   < > = values in this interval not displayed. ------------------------------------------------------------------------------------------------------------------  Cardiac Enzymes No results for input(s): TROPONINI in the last 168 hours. ------------------------------------------------------------------------------------------------------------------  RADIOLOGY:  No results found.  EKG:     ASSESSMENT AND PLAN:   82 year old Spencer [name] with past medical history of diabetes, hypertension, diabetic neuropathy, hyperlipidemia, history of arthritis of presents to the hospital due to falls and also noted to have a right foot cellulitis and diabetic foot ulcer.  1 Right foot cellulitis with diabetic foot ulcer/abscess: after a trauma - appreciate podiatry consult and had debridement done after admission- for repeat debridement tomorrow - appreciate vascular consult- s/p angiogram and PCI intervention to right leg - Wound cultures growing MSSA. Patient is currently on Zosyn. - ID following -Patient's x-rays negative for any evidence of osteomyelitis - pain meds prn  2 MSSA Sepsis, present  on admission: Continue IV zosyn as mentioned above.  Appreciate ID consult. Spencer Spencer [name] has PICC line placed. ID to assist with duration of  ANTIBIOTICS.   4 diabetes type 2 without complication: Continue sliding scale insulin and ADA diet.   5 Depression - cont. Cymbalta.   6 Hyperlipidemia - cont. Simvastatin.   7. New onset atrial fibrillation: in afib  Cardiology recommended using anticoagulation. No need to bridge now. Start  ELIQUIS at discharge or tomorrow if no longer going for surgery..   8. . Meningioma: Patient reports that Spencer Spencer [name] has known about this meningioma.  MRI of brain was negative for stroke. No further workup for meningioma  9. DVT Prophylaxis- on lovenox  PT recommended SNF-  Monday All the records are reviewed and case discussed with Care Management/Social Worker. Management plans discussed with the patient  CODE STATUS: Full Code  TOTAL TIME TAKING CARE OF THIS PATIENT: 18minutes.   POSSIBLE D/C monday DEPENDING ON CLINICAL CONDITION.   Spencer Spencer [name] M.D on 01/30/2015 at 1:34 PM  Between 7am to 6pm - Pager - [phone]  After 6pm go to [email or web address] - password [email or web address] [facility]  [facility]  Office  [phone]  CC: Primary care physician; Spencer Pink, MD

## 2015-01-30 NOTE — Progress Notes (Addendum)
OR tech here to take patient to surgery. Family at bedside. Charge Nurse at bedside to assist with transport to OR.

## 2015-01-30 NOTE — Care Management Important Message (Signed)
Important Message  Patient Details  Name: Spencer [name] MRN: [id number] Date of Birth: February 25, 1917   Medicare Important Message Given:  Yes    Juliann Pulse A Varnika Butz 01/30/2015, 11:34 AM

## 2015-01-30 NOTE — Anesthesia Procedure Notes (Signed)
Date/Time: 01/30/2015 9:47 AM Performed by: Doreen Salvage Pre-anesthesia Checklist: Patient identified, Emergency Drugs available, Suction available and Patient being monitored Patient Re-evaluated:Patient Re-evaluated prior to inductionOxygen Delivery Method: Simple face mask Intubation Type: IV induction Dental Injury: Teeth and Oropharynx as per pre-operative assessment

## 2015-01-30 NOTE — Anesthesia Preprocedure Evaluation (Addendum)
Anesthesia Evaluation  Patient identified by MRN, date of birth, ID band Patient awake    Reviewed: Allergy & Precautions, NPO status , Patient's Chart, lab work & pertinent test results  History of Anesthesia Complications Negative for: history of anesthetic complications  Airway Mallampati: III       Dental  (+) Missing   Pulmonary neg pulmonary ROS, former smoker,           Cardiovascular hypertension, Pt. on medications + dysrhythmias Atrial Fibrillation      Neuro/Psych negative neurological ROS  negative psych ROS   GI/Hepatic negative GI ROS, Neg liver ROS,   Endo/Other  diabetes, Type 2, Oral Hypoglycemic Agents  Renal/GU negative Renal ROS     Musculoskeletal  (+) Arthritis , Osteoarthritis,    Abdominal   Peds  Hematology negative hematology ROS (+)   Anesthesia Other Findings   Reproductive/Obstetrics                            Anesthesia Physical Anesthesia Plan  ASA: III  Anesthesia Plan: General   Post-op Pain Management:    Induction: Intravenous  Airway Management Planned: Nasal Cannula  Additional Equipment:   Intra-op Plan:   Post-operative Plan:   Informed Consent: I have reviewed the patients History and Physical, chart, labs and discussed the procedure including the risks, benefits and alternatives for the proposed anesthesia with the patient or authorized representative who has indicated his/her understanding and acceptance.     Plan Discussed with:   Anesthesia Plan Comments:         Anesthesia Quick Evaluation

## 2015-01-30 NOTE — Transfer of Care (Signed)
Immediate Anesthesia Transfer of Care Note  Patient: Spencer [name]  Procedure(s) Performed: Procedure(s): AMPUTATION FOOT/ TRANSMETATARSAL (Right)  Patient Location: PACU  Anesthesia Type:General  Level of Consciousness: sedated  Airway & Oxygen Therapy: Patient Spontanous Breathing and Patient connected to face mask oxygen  Post-op Assessment: Report given to RN  Post vital signs: Reviewed and stable  Last Vitals:  Filed Vitals:   01/30/15 0844 01/30/15 1219  BP: 151/65 153/59  Pulse: 78 68  Temp: 35.6 C 36.1 C  Resp: 18 12    Complications: No apparent anesthesia complications

## 2015-01-30 NOTE — H&P (Signed)
  HISTORY AND PHYSICAL INTERVAL NOTE:  01/30/2015  9:15 AM  Spencer [name]  has presented today for surgery, with the diagnosis of GANGRENOUS.  The various methods of treatment have been discussed with the patient.  No guarantees were given.  After consideration of risks, benefits and other options for treatment, the patient has consented to surgery.  I have reviewed the patients' chart and labs.    Patient Vitals for the past 24 hrs:  BP Temp Temp src Pulse Resp SpO2 Height Weight  01/30/15 0844 (!) 151/65 mmHg (!) 96.1 F (35.6 C) Oral 78 18 95 % 5\' 6"  (1.676 m) 73.029 kg (161 lb)  01/30/15 0747 (!) 159/75 mmHg 97.7 F (36.5 C) Oral 79 18 91 % - -  01/30/15 0450 (!) 142/62 mmHg 97.9 F (36.6 C) Oral 88 18 94 % - -  01/29/15 2007 (!) 151/51 mmHg 99.1 F (37.3 C) Oral 84 18 94 % - -  01/29/15 1533 (!) 146/55 mmHg 98.2 F (36.8 C) Oral 88 20 95 % - -    The patient was reexamined.  There have been no changes to this history and physical examination.Plan for debridment and possible trans-metatatarsal amputation.  I discussed this specific plan with the pt and family in great detail and they have expressed understanding.  Samara Deist A

## 2015-01-30 NOTE — Op Note (Signed)
Operative note   Surgeon:Juwon Scripter Lawyer: None    Preop diagnosis: Gangrene right forefoot    Postop diagnosis: Same    Procedure: Transmetatarsal amputation right foot    EBL: 100 mL's    Anesthesia:local and IV sedation    Hemostasis: None    Specimen: Gangrenous right first ray    Complications: None    Operative indications: 82 year old gentleman with recent abscess to his right great toe and joint region. Developed gangrenous changes to the right forefoot. He underwent revascularization and presents today for further surgical debridement. The risks benefits alternatives and competitions have been discussed with the patient and family and consent has been given    Procedure:  Patient was brought into the OR and placed on the operating table in thesupine position. After anesthesia was obtained theright lower extremity was prepped and draped in usual sterile fashion.attention was directed to the right medial forefoot where a large amount of gangrenous changes had occurred along the entire first ray into the plantar arch. Initially I sharply debrided all of the necrotic tissue. The first metatarsal head was exposed and necrotic as well. At this time I removed the first ray to the base of the first metatarsal sharply. This was sent for pathological examination. There is still noted to be a large defect into the medial arch due to the large amount of necrotic tissue that had occurred. At this time in order to provide closure of the wound I elected to proceed with a transmetatarsal amputation. A full-thickness dorsal skin flap was begun dorsally at the metatarsophalangeal joints. The plantar skin flap was also full-thickness at the metatarsophalangeal joints and ending at the base of the metatarsal region. The metatarsals were then excised with a power saw at the proximal one third region. All areas were then removed from the surgical field in toto. All bleeders were Bovie  cauterized at this time. The medial portion where the large amount of necrotic tissue was excisionally debrided with a versa jet down to good healthy bleeding tissue. A final flush of the wound was then undertaken. Time the plantar skin flap was allowed to mobilize to the medial portion and I was able to perform a primary closure of the wound. The incision was closed with a 3-0 Vicryl and 2-0 nylon. 0.5% Marcaine was placed around all areas. A large bulky sterile dressing was placed to the right foot and leg. He tolerated the procedure well and was transported from the OR to the PACU with all vital signs stable vastus intact. The plantar and dorsal skin flaps were noted to be well-perfused are to placement of the dressing.   Patient tolerated the procedure and anesthesia well.  Was transported from the OR to the PACU with all vital signs stable and vascular status intact. he'll be readmitted to the floor and further monitoring will occur.

## 2015-01-30 NOTE — Progress Notes (Signed)
Infectious Disease Long Term IV Antibiotic Orders  Diagnosis: Gangrene R foot, s/p TMA 1/20  Culture results MSSA   Allergies: No Known Allergies  Discharge antibiotics Zosyn 3.375 grams every 12 hours PICC Care per protocol Labs weekly while on IV antibiotics      CBC w diff   Comprehensive met panel  CRP   Planned duration of antibiotics 14 days from 1/20  Stop date 02/13/15  Follow up clinic date TBD FAX weekly labs to [phone]  Leonel Ramsay, MD

## 2015-01-30 NOTE — Clinical Documentation Improvement (Signed)
Internal Medicine  Abnormal Lab/Test Results:   Component      RBC Hemoglobin HCT  Latest Ref Rng      4.40 - 5.90 MIL/uL 13.0 - 18.0 g/dL 40.0 - 52.0 %  01/21/2015     5:00 PM 4.81 10.9 (L) 35.8 (L)  01/22/2015     4:52 AM 4.28 (L) 9.6 (L) 31.4 (L)  01/24/2015     3:29 AM 4.50 10.0 (L) 32.8 (L)  01/25/2015     5:49 AM 4.53 10.0 (L) 32.4 (L)  01/26/2015     3:48 AM 4.44 9.7 (L) 31.8 (L)  01/27/2015     4:50 AM 4.35 (L) 9.5 (L) 31.1 (L)  01/28/2015     5:41 AM 4.24 (L) 9.5 (L) 30.9 (L)  01/29/2015     5:15 AM 4.08 (L) 9.3 (L) 29.9 (L)   Possible Clinical Conditions associated with below indicators  Anemia (please specify acuity and type if known)  Other Condition  Cannot Clinically Determine  Please exercise your independent, professional judgment when responding. A specific answer is not anticipated or expected.Please update your documentation within the medical record to reflect your response to this query.  Thank you, Mateo Flow, RN [phone] Clinical Documentation Specialist

## 2015-01-30 NOTE — Anesthesia Postprocedure Evaluation (Signed)
Anesthesia Post Note  Patient: Spencer [name]  Procedure(s) Performed: Procedure(s) (LRB): AMPUTATION FOOT/ TRANSMETATARSAL (Right)  Patient location during evaluation: PACU Anesthesia Type: General Level of consciousness: awake and alert Pain management: pain level controlled Vital Signs Assessment: post-procedure vital signs reviewed and stable Respiratory status: spontaneous breathing and respiratory function stable Cardiovascular status: stable Anesthetic complications: no    Last Vitals:  Filed Vitals:   01/30/15 1234 01/30/15 1249  BP: 131/52 145/62  Pulse: 68 68  Temp:    Resp: 13 13    Last Pain:  Filed Vitals:   01/30/15 1254  PainSc: 0-No pain                 KEPHART,WILLIAM K

## 2015-01-30 NOTE — Progress Notes (Signed)
Pharmacy Antibiotic Follow-up Note  Spencer [name] is a 82 y.o. year-old male admitted on 01/21/2015.  The patient is currently on day 10 of Zosyn for cellulits of foot in diabetic.  Assessment/Plan: This patient's current antibiotics will be continued without adjustments. Per ID notes, to continue Zosyn as this is best option for limb salvage. Patient to OR for repeat debridement today.  Temp (24hrs), Avg:97.7 F (36.5 C), Min:96.1 F (35.6 C), Max:99.1 F (37.3 C)   Recent Labs Lab 01/25/15 0549 01/26/15 0348 01/27/15 0450 01/28/15 0541 01/29/15 0515  WBC 20.2* 17.2* 16.3* 15.3* 14.3*    Recent Labs Lab 01/25/15 0549 01/26/15 0348 01/27/15 0450 01/28/15 0541 01/29/15 0515  CREATININE 0.87 0.87 0.80 0.82 0.82   Estimated Creatinine Clearance: 48.6 mL/min (by C-G formula based on Cr of 0.82).    No Known Allergies  Antimicrobials this admission: Zosyn 1/11 >>    Levels/dose changes this admission:  Microbiology results: Wound: MSSA  Thank you for allowing pharmacy to be a part of this patient's care.  Paulina Fusi, PharmD, BCPS 01/30/2015 12:40 PM

## 2015-01-30 NOTE — Progress Notes (Signed)
PT Cancellation Note  Patient Details Name: Spencer [name] MRN: [id number] DOB: [birth year]   Cancelled Treatment:    Reason Eval/Treat Not Completed: Other (comment). Pt to OR for procedure, not available for therapy at this time. Will re-attempt as medically stable.    Kacee Sukhu 01/30/2015, 10:36 AM  Greggory Stallion, PT, DPT [phone]

## 2015-01-30 NOTE — Progress Notes (Signed)
Patient is going to the OR today. Per MD patient will likely not be ready for D/C over the weekend. Plan is for patient to go to [facility] next week when medically stable. [facility] admissions coordinator at [facility] is aware of above. Clinical Social Worker (CSW) will continue to follow and assist as needed.   Blima Rich, LCSW [phone]

## 2015-01-31 ENCOUNTER — Encounter: Payer: Self-pay | Admitting: Podiatry

## 2015-01-31 LAB — GLUCOSE, CAPILLARY
GLUCOSE-CAPILLARY: 133 mg/dL — AB (ref 65–99)
Glucose-Capillary: 195 mg/dL — ABNORMAL HIGH (ref 65–99)
Glucose-Capillary: 208 mg/dL — ABNORMAL HIGH (ref 65–99)
Glucose-Capillary: 223 mg/dL — ABNORMAL HIGH (ref 65–99)

## 2015-01-31 NOTE — Progress Notes (Signed)
1 Day Post-Op  Subjective: Patient seen. Complains of some occasional shooting pains down in the foot, approximately 4-5/h. Overall it is not really that painful.  Objective: Vital signs in last 24 hours: Temp:  [97.6 F (36.4 C)-98.2 F (36.8 C)] 98 F (36.7 C) (01/21 1508) Pulse Rate:  [72-95] 84 (01/21 1508) Resp:  [18-20] 18 (01/21 1508) BP: (117-141)/(49-90) 141/52 mmHg (01/21 1508) SpO2:  [90 %-93 %] 93 % (01/21 1508) Weight:  [73.4 kg (161 lb 13.1 oz)] 73.4 kg (161 lb 13.1 oz) (01/21 0456) Last BM Date: 01/29/15  Intake/Output from previous day: 01/20 0701 - 01/21 0700 In: 2320 [P.O.:200; I.V.:1970; IV Piggyback:150] Out: 750 [Urine:650; Blood:100] Intake/Output this shift: Total I/O In: 120 [P.O.:120] Out: 150 [Urine:150]  Moderate bleeding is noted on the bandage. Upon removal of the bandage all of the incisions are well coapted and all skin edges appear viable. No active drainage at this point. No significant cellulitis  Lab Results:   Recent Labs  01/29/15 0515  WBC 14.3*  HGB 9.3*  HCT 29.9*  PLT 403   BMET  Recent Labs  01/29/15 0515  NA 137  K 4.0  CL 102  CO2 26  GLUCOSE 124*  BUN 17  CREATININE 0.82  CALCIUM 7.6*   PT/INR No results for input(s): LABPROT, INR in the last 72 hours. ABG No results for input(s): PHART, HCO3 in the last 72 hours.  Invalid input(s): PCO2, PO2  Studies/Results: No results found.  Anti-infectives: Anti-infectives    Start     Dose/Rate Route Frequency Ordered Stop   01/27/15 1400  cefUROXime (ZINACEF) 1.5 g in dextrose 5 % 50 mL IVPB     1.5 g 100 mL/hr over 30 Minutes Intravenous  Once 01/27/15 1348 01/27/15 1419   01/24/15 0500  vancomycin (VANCOCIN) IVPB 750 mg/150 ml premix  Status:  Discontinued     750 mg 150 mL/hr over 60 Minutes Intravenous Every 24 hours 01/24/15 0445 01/25/15 0933   01/24/15 0400  vancomycin (VANCOCIN) IVPB 750 mg/150 ml premix  Status:  Discontinued     750 mg 150 mL/hr over  60 Minutes Intravenous Every 24 hours 01/23/15 0858 01/24/15 0445   01/23/15 0400  vancomycin (VANCOCIN) 1,500 mg in sodium chloride 0.9 % 500 mL IVPB  Status:  Discontinued     1,500 mg 250 mL/hr over 120 Minutes Intravenous Every 24 hours 01/22/15 0901 01/23/15 0858   01/22/15 0400  piperacillin-tazobactam (ZOSYN) IVPB 3.375 g     3.375 g 12.5 mL/hr over 240 Minutes Intravenous 3 times per day 01/21/15 2123     01/22/15 0400  vancomycin (VANCOCIN) 1,500 mg in sodium chloride 0.9 % 500 mL IVPB  Status:  Discontinued     1,500 mg 250 mL/hr over 120 Minutes Intravenous Every 18 hours 01/21/15 2137 01/22/15 0901   01/21/15 2145  vancomycin (VANCOCIN) 500 mg in sodium chloride 0.9 % 100 mL IVPB     500 mg 100 mL/hr over 60 Minutes Intravenous STAT 01/21/15 2134 01/22/15 0123   01/21/15 1815  vancomycin (VANCOCIN) IVPB 1000 mg/200 mL premix     1,000 mg 200 mL/hr over 60 Minutes Intravenous  Once 01/21/15 1806 01/21/15 2005   01/21/15 1815  piperacillin-tazobactam (ZOSYN) IVPB 3.375 g     3.375 g 12.5 mL/hr over 240 Minutes Intravenous  Once 01/21/15 1806 01/22/15 0017      Assessment/Plan: s/p Procedure(s): AMPUTATION FOOT/ TRANSMETATARSAL (Right) Assessment: Gangrene status post revascularization and transmetatarsal amputation right foot  Plan: A dry sterile bandage was placed back onto the foot. This was covered with an Ace wrap. Dr. Vickki Muff will follow up with the patient on Monday.  LOS: 10 days    Spencer [name] W. 01/31/2015

## 2015-01-31 NOTE — Progress Notes (Signed)
[facility] [place] NAME: Alvan Erstad    MR#:  [id number]  DATE OF BIRTH:  1917-10-14  SUBJECTIVE:  No acute issues overnight patient is still sleepy but voices no complaints.  REVIEW OF SYSTEMS:  Review of Systems  Constitutional: Negative for fever and chills.  HENT: Positive for hearing loss. Negative for ear discharge, ear pain and nosebleeds.   Eyes: Negative for blurred vision.  Respiratory: Negative for cough, shortness of breath and wheezing.   Cardiovascular: Negative for chest pain and palpitations.  Gastrointestinal: Negative for nausea, vomiting, abdominal pain, diarrhea and constipation.  Genitourinary: Negative for dysuria.  Musculoskeletal: Negative for myalgias.  Neurological: Negative for dizziness, tremors, speech change, focal weakness, seizures, weakness and headaches.  Psychiatric/Behavioral: Negative for depression.    DRUG ALLERGIES:  No Known Allergies  VITALS:  Blood pressure 126/57, pulse 92, temperature 98.1 F (36.7 C), temperature source Oral, resp. rate 18, height 5\' 6"  (1.676 m), weight 73.4 kg (161 lb 13.1 oz), SpO2 91 %.  PHYSICAL EXAMINATION:  Physical Exam  Constitutional: He is oriented to person, place, and time and well-developed, well-nourished, and in no distress. No distress.  HENT:  Head: Normocephalic.  Eyes: No scleral icterus.  Neck: Normal range of motion. Neck supple. No JVD present. No tracheal deviation present.  Cardiovascular: Normal rate, regular rhythm and normal heart sounds.  Exam reveals no gallop and no friction rub.   No murmur heard. Pulmonary/Chest: Effort normal and breath sounds normal. No respiratory distress. He has no wheezes. He has no rales. He exhibits no tenderness.  Abdominal: Soft. Bowel sounds are normal. He exhibits no distension and no mass. There is no tenderness. There is no rebound and no guarding.  Musculoskeletal: Normal range of motion. He  exhibits no edema.  Neurological: He is alert and oriented to person, place, and time.  Skin: Skin is warm. No rash noted. No erythema.  Foot wrapped  Psychiatric: Affect and judgment normal.      LABORATORY PANEL:   CBC  Recent Labs Lab 01/29/15 0515  WBC 14.3*  HGB 9.3*  HCT 29.9*  PLT 403   ------------------------------------------------------------------------------------------------------------------  Chemistries   Recent Labs Lab 01/29/15 0515  NA 137  K 4.0  CL 102  CO2 26  GLUCOSE 124*  BUN 17  CREATININE 0.82  CALCIUM 7.6*   ------------------------------------------------------------------------------------------------------------------  Cardiac Enzymes No results for input(s): TROPONINI in the last 168 hours. ------------------------------------------------------------------------------------------------------------------  RADIOLOGY:  No results found.  EKG:     ASSESSMENT AND PLAN:   82 year old male with past medical history of diabetes, hypertension, diabetic neuropathy, hyperlipidemia, history of arthritis of presents to the hospital due to falls and also noted to have a right foot cellulitis and diabetic foot ulcer.  1 Right foot cellulitis with diabetic foot ulcer/abscess: after a trauma Appreciate podiatry consult. He underwent surgical debridement on admission adn again 1/20. He is s/p angiogram and PCI intervention to right leg. Wound cultures growing MSSA. Patient is currently on Zosyn. Dr. Ola Spurr is recommending 2 weeks of IV antibiotics. Patient does have a PICC line placed. His end date is February 3 for IV antibiotics.   2 MSSA Sepsis, present on admission: Continue IV zosyn as mentioned above.  Appreciate ID consult. He has PICC line placed.  3. diabetes type 2 without complication: Continue sliding scale insulin and ADA diet.   4. Depression - cont. Cymbalta.   5 Hyperlipidemia - cont. Simvastatin.   6. New  onset  atrial fibrillation:  Cardiology recommended using anticoagulation due to CHADS score of at least 6.. No need to bridge now. Start  ELIQUIS likely tomorrow if he will not be requiring further surgery.   7. . Meningioma: Patient reports that he has known about this meningioma.  MRI of brain was negative for stroke. No further workup for meningioma  8. Anemia of chronic disease: Hemoglobin is relatively stable. Continue to monitor.  PT recommended SNF     Management plans discussed with the patient  CODE STATUS: Full Code  TOTAL TIME TAKING CARE OF THIS PATIENT: 22 minutes.   POSSIBLE D/C EARLY NEXT WEEK DEPENDING ON CLINICAL CONDITION.   Kaliyan Osbourn M.D on 01/31/2015 at 9:47 AM  Between 7am to 6pm - Pager - [phone]  After 6pm go to [email or web address] - password [email or web address] [facility]  [facility]  Office  [phone]  CC: Primary care physician; [name], MD

## 2015-02-01 LAB — CBC
HCT: 26.5 % — ABNORMAL LOW (ref 40.0–52.0)
HEMOGLOBIN: 8.3 g/dL — AB (ref 13.0–18.0)
MCH: 22.7 pg — AB (ref 26.0–34.0)
MCHC: 31.1 g/dL — ABNORMAL LOW (ref 32.0–36.0)
MCV: 72.9 fL — AB (ref 80.0–100.0)
Platelets: 490 10*3/uL — ABNORMAL HIGH (ref 150–440)
RBC: 3.64 MIL/uL — AB (ref 4.40–5.90)
RDW: 18.4 % — ABNORMAL HIGH (ref 11.5–14.5)
WBC: 15.7 10*3/uL — AB (ref 3.8–10.6)

## 2015-02-01 LAB — GLUCOSE, CAPILLARY
GLUCOSE-CAPILLARY: 127 mg/dL — AB (ref 65–99)
GLUCOSE-CAPILLARY: 210 mg/dL — AB (ref 65–99)
GLUCOSE-CAPILLARY: 192 mg/dL — AB (ref 65–99)
Glucose-Capillary: 184 mg/dL — ABNORMAL HIGH (ref 65–99)

## 2015-02-01 MED ORDER — BISACODYL 10 MG RE SUPP
10.0000 mg | Freq: Every day | RECTAL | Status: DC | PRN
  Administered 2015-02-02: 10 mg via RECTAL
  Filled 2015-02-01: qty 1

## 2015-02-01 NOTE — Evaluation (Signed)
Physical Therapy Evaluation Patient Details Name: Spencer [name] MRN: [id number] DOB: December 25, 1917 Today's Date: 02/01/2015   History of Present Illness  82yo white male who came to [facility] from [place] on 1/11 after 10 days R foot pain/edema. Pt underwent I&D on 1/13, after which he demonstrated new afib, new dysarthria, and cognitive changes. Cardiology recommending anticoagulation, and neuro/CT/MRI clearing acute infarct (dysarthria eventually resolving). Pt underwent angioplasty on 1/17 after limited healing p I&D. Pt subsequently with gangrenous tissue changes and resultant R hallux transmet amputation on 1/20. At baseline, pt was very indep and mobile with SPC, but with profoudn hearing impairment. P most recent surgery, physician selected for continuation of original PT orders, however patient remains on bedrest at this time, with continued NWB (assumed RLE).   Clinical Impression  Reevaluation after hallux trans-met amputation: Pt received semirecumbent in bed, feet elevated watching the [employer]. Pt is pleasant and agreeable to PT session, but is insistent upon PT being aware of current NWB status. PT concurs. Pt demonstrates mild-moderatley impaired strength requiring some assistance for bed mobility, scooting, and maintaining upright trunk with select therex. Pt c/o DOE after 10 minutes of straight exercise with SaO2 at 88% on RA which does not resolve within 30 seconds (RN notified). As demonstrated in prior evaluations this visit, pt will need  Assistance with mobility and ADL at DC, and require skilled PT intervention to address the above deficits.     Follow Up Recommendations SNF    Equipment Recommendations  Rolling walker with 5" wheels    Recommendations for Other Services       Precautions / Restrictions Precautions Precautions: Fall Restrictions Weight Bearing Restrictions: Yes RLE Weight Bearing: Non weight bearing (assumed R side, not specificed in order. )       Mobility  Bed Mobility Overal bed mobility: Needs Assistance Bed Mobility: Supine to Sit;Sit to Supine     Supine to sit: Min guard Sit to supine: Min guard   General bed mobility comments: good strength, excellent use of trapeze to pull up. Still requires modA for scooting.   Transfers Overall transfer level:  (Under bedrest orders.)                  Ambulation/Gait Ambulation/Gait assistance:  (Under bedrest orders.)              Stairs            Wheelchair Mobility    Modified Rankin (Stroke Patients Only)       Balance Overall balance assessment: Needs assistance       Postural control: Posterior lean Standing balance support: Bilateral upper extremity supported;During functional activity Standing balance-Leahy Scale: Zero Standing balance comment: requires total assist of trunk during LAQ, likely related to HS tightness.                              Pertinent Vitals/Pain Pain Assessment: 0-10 Pain Score: 6  Pain Location: No pain at rest, intermittent 6/10 pain with muscle spasms, 3-4x daily.  Pain Intervention(s): Limited activity within patient's tolerance;Monitored during session;Repositioned    Home Living Family/patient expects to be discharged to:: [place]: [name] - single point      Prior Function Level of Independence: Independent         Comments: Pt was very active, driving, taking care of  himself w/o issue.     Hand Dominance        Extremity/Trunk Assessment   Upper Extremity Assessment: Overall WFL for tasks assessed           Lower Extremity Assessment:  (difficult to assess with bedrest orders, previosuly able to transfer earlier this week, with minimal assistance. )      Cervical / Trunk Assessment:  (generalized weakness, limited endurance. )  Communication   Communication: HOH  Cognition Arousal/Alertness: Awake/alert Behavior  During Therapy: WFL for tasks assessed/performed Overall Cognitive Status: Within Functional Limits for tasks assessed                      General Comments      Exercises Other Exercises Other Exercises: Heel slides x10 bilat supervision only.  Other Exercises: SAQ x10 bilat, supervision only; LAQ at EOB x10, requires total asssit for trunk control.  Other Exercises: ankle pumps x15 bilat at supervision Other Exercises: hip abduction x15 bilat at supervision Other Exercises: Overhead reaching x10, also requires trunk assistance to prevent falling backwards.       Assessment/Plan    PT Assessment Patient needs continued PT services  PT Diagnosis Generalized weakness;Acute pain   PT Problem List Decreased strength;Decreased activity tolerance;Decreased balance;Decreased mobility;Decreased coordination;Decreased knowledge of use of DME;Decreased safety awareness;Decreased range of motion;Cardiopulmonary status limiting activity  PT Treatment Interventions DME instruction;Stair training;Therapeutic activities;Therapeutic exercise;Balance training;Functional mobility training;Gait training;Patient/family education   PT Goals (Current goals can be found in the Care Plan section) Acute Rehab PT Goals Patient Stated Goal: maintain strength, regain independence.  PT Goal Formulation: With patient Time For Goal Achievement: 02/15/15 Potential to Achieve Goals: Good    Frequency 7X/week   Barriers to discharge Decreased caregiver support;Inaccessible home environment      Co-evaluation               End of Session Equipment Utilized During Treatment: Other (comment) (space boots) Activity Tolerance: Patient tolerated treatment well;Patient limited by fatigue;Other (comment) (DOE ) Patient left: in bed;with call bell/phone within reach;with bed alarm set Nurse Communication: Mobility status;Other (comment)         Time: 3014-9969 PT Time Calculation (min) (ACUTE  ONLY): 19 min   Charges:   PT Evaluation $PT Re-evaluation: 1 Procedure PT Treatments $Therapeutic Exercise: 8-22 mins   PT G Codes:        Buccola,Allan C 2015-02-20, 5:03 PM  5:07 PM, 20-Feb-2015 Etta Grandchild, PT, DPT PRN Physical Therapist at [place] License # [id number] [phone] (mobile)

## 2015-02-01 NOTE — Progress Notes (Signed)
[facility] [place] NAME: Spencer [name]    MR#:  [id number]  DATE OF BIRTH:  [birth year]  SUBJECTIVE:   Patient voices no complaints  REVIEW OF SYSTEMS:  Review of Systems  Constitutional: Negative for fever and chills.  HENT: Positive for hearing loss. Negative for ear discharge, ear pain and nosebleeds.   Eyes: Negative for blurred vision.  Respiratory: Negative for cough, shortness of breath and wheezing.   Cardiovascular: Negative for chest pain and palpitations.  Gastrointestinal: Negative for nausea, vomiting, abdominal pain, diarrhea and constipation.  Genitourinary: Negative for dysuria.  Musculoskeletal: Negative for myalgias.  Neurological: Negative for dizziness, tremors, speech change, focal weakness, seizures, weakness and headaches.  Psychiatric/Behavioral: Negative for depression.    DRUG ALLERGIES:  No Known Allergies  VITALS:  Blood pressure 113/49, pulse 84, temperature 98.2 F (36.8 C), temperature source Oral, resp. rate 16, height 5\' 6"  (1.676 m), weight 73.4 kg (161 lb 13.1 oz), SpO2 90 %.  PHYSICAL EXAMINATION:  Physical Exam  Constitutional: He is oriented to person, place, and time and well-developed, well-nourished, and in no distress. No distress.  HENT:  Head: Normocephalic.  Eyes: No scleral icterus.  Neck: Normal range of motion. Neck supple. No JVD present. No tracheal deviation present.  Cardiovascular: Normal rate, regular rhythm and normal heart sounds.  Exam reveals no gallop and no friction rub.   No murmur heard. Pulmonary/Chest: Effort normal and breath sounds normal. No respiratory distress. He has no wheezes. He has no rales. He exhibits no tenderness.  Abdominal: Soft. Bowel sounds are normal. He exhibits no distension and no mass. There is no tenderness. There is no rebound and no guarding.  Musculoskeletal: Normal range of motion. He exhibits no edema.  Neurological: He is alert  and oriented to person, place, and time.  Skin: Skin is warm. No rash noted. No erythema.  Foot wrapped with Ace bandage  Psychiatric: Affect and judgment normal.      LABORATORY PANEL:   CBC  Recent Labs Lab 02/01/15 0308  WBC 15.7*  HGB 8.3*  HCT 26.5*  PLT 490*   ------------------------------------------------------------------------------------------------------------------  Chemistries   Recent Labs Lab 01/29/15 0515  NA 137  K 4.0  CL 102  CO2 26  GLUCOSE 124*  BUN 17  CREATININE 0.82  CALCIUM 7.6*   ------------------------------------------------------------------------------------------------------------------  Cardiac Enzymes No results for input(s): TROPONINI in the last 168 hours. ------------------------------------------------------------------------------------------------------------------  RADIOLOGY:  No results found.  EKG:     ASSESSMENT AND PLAN:   82 year old male with past medical history of diabetes, hypertension, diabetic neuropathy, hyperlipidemia, history of arthritis of presents to the hospital due to falls and also noted to have a right foot cellulitis and diabetic foot ulcer.  1 Right foot cellulitis with diabetic foot ulcer/abscess:Appreciate podiatry consult. He underwent surgical debridement on admission and again on 1/20. He is s/p angiogram and PCI intervention to right leg. Wound cultures growing MSSA. Patient is currently on Zosyn. Dr. Ola Spurr is recommending 2 weeks of IV antibiotics. Patient does have a PICC line placed. His end date is February 3 for IV antibiotics.   2 MSSA Sepsis, present on admission: Continue IV zosyn as mentioned above.  Appreciate ID consult. He has PICC line placed.  3. diabetes type 2 without complication: Continue sliding scale insulin and ADA diet.   4. Depression - cont. Cymbalta.   5 Hyperlipidemia - cont. Simvastatin.   6. New onset atrial fibrillation:  Cardiology recommended  using anticoagulation due to CHADS score of at least 6.. No need to bridge now. Start ELIQUIS at discharge  if he will not be requiring further surgery.   7. . Meningioma: Patient reports that he has known about this meningioma.  MRI of brain was negative for stroke. No further workup for meningioma  8. Anemia of chronic disease: Hemoglobin is relatively stable.  No acute indication for blood transfusion. PT recommended SNF     Management plans discussed with the patient  CODE STATUS: Full Code  TOTAL TIME TAKING CARE OF THIS PATIENT: 20 minutes.   POSSIBLE D/C EARLY NEXT WEEK DEPENDING ON CLINICAL CONDITION.   Spencer [name] M.D on 02/01/2015 at 9:19 AM  Between 7am to 6pm - Pager - [phone]  After 6pm go to [email or web address] - password [email or web address]  [name]  Office  [phone]  CC: Primary care physician; [name], MD

## 2015-02-01 NOTE — Progress Notes (Signed)
Patient has order for bedrest but has PT orders.  Dr Cleda Mccreedy said that pt can be up with assistance, NWB to right leg

## 2015-02-02 LAB — GLUCOSE, CAPILLARY
Glucose-Capillary: 128 mg/dL — ABNORMAL HIGH (ref 65–99)
Glucose-Capillary: 158 mg/dL — ABNORMAL HIGH (ref 65–99)

## 2015-02-02 MED ORDER — PIPERACILLIN-TAZOBACTAM 3.375 G IVPB: 3.3750 g | Freq: Three times a day (TID) | INTRAVENOUS | Status: AC

## 2015-02-02 MED ORDER — ENSURE ENLIVE PO LIQD: 237.0000 mL | Freq: Three times a day (TID) | ORAL | Status: DC

## 2015-02-02 MED ORDER — HYDROCODONE-ACETAMINOPHEN 5-325 MG PO TABS: 1.0000 | ORAL_TABLET | Freq: Four times a day (QID) | ORAL | Status: DC | PRN

## 2015-02-02 NOTE — Discharge Summary (Addendum)
[facility] at [place] NAME: Thurmon Greeney    MR#:  [id number]  DATE OF BIRTH:  [birth year]  DATE OF ADMISSION:  01/21/2015 ADMITTING PHYSICIAN: Henreitta Leber, MD  DATE OF DISCHARGE: 02/02/2015 PRIMARY CARE PHYSICIAN: [name], MD    ADMISSION DIAGNOSIS:  Gangrene of foot ([place]) [I96] Cellulitis of right foot [id number]  DISCHARGE DIAGNOSIS:  Active Problems:   Cellulitis   SECONDARY DIAGNOSIS:   Past Medical History  Diagnosis Date  . Diabetes mellitus without complication ([place])   . Hypertension   . Arthritis   . High cholesterol     HOSPITAL COURSE:   82 year old male with past medical history of diabetes, hypertension, diabetic neuropathy, hyperlipidemia, history of arthritis of presents to the hospital due to falls and also noted to have a right foot cellulitis and diabetic foot ulcer.  1 Right foot cellulitis with diabetic foot ulcer/abscess:Appreciate podiatry consult. He underwent surgical debridement on admission and again on 1/20. He is s/p angiogram and PCI intervention to right leg. Wound cultures growing MSSA. Patient is currently on Zosyn. Dr. Ola Spurr is recommending 2 weeks of IV antibiotics. Patient does have a PICC line placed. His end date is February 3 for IV antibiotics.  Dressing changes including every other day dressing with dry dressing and pad. Patient is not weight hearing on the right foot. 2 MSSA Sepsis, present on admission: Continue IV zosyn as mentioned above. Appreciate ID consult. He has PICC line placed.  3. diabetes type 2 without complication: Continue sliding scale insulin and ADA diet.   4. Depression - cont. Cymbalta.   5 Hyperlipidemia - cont. Simvastatin.   6. New onset atrial fibrillation:  Cardiology recommended using anticoagulation due to CHADS score of at least 6.. No need to bridge now. Start ELIQUIS at discharge if he will not be requiring further surgery.    7. . Meningioma: Patient reports that he has known about this meningioma.  MRI of brain was negative for stroke. No further workup for meningioma  8. Anemia of chronic disease: Hemoglobin is relatively stable. No acute indication for blood transfusion. PT recommended SNF    DISCHARGE CONDITIONS AND DIET:   Stable condition Diabetic diet  CONSULTS OBTAINED:  Treatment Team:  Samara Deist, DPM Katha Cabal, MD Adrian Prows, MD Teodoro Spray, MD Leotis Pain, MD  DRUG ALLERGIES:  No Known Allergies  DISCHARGE MEDICATIONS:   Current Discharge Medication List    START taking these medications   Details  feeding supplement, ENSURE ENLIVE, (ENSURE ENLIVE) LIQD Take 237 mLs by mouth 3 (three) times daily with meals. Qty: 237 mL, Refills: 12    HYDROcodone-acetaminophen (NORCO/VICODIN) 5-325 MG tablet Take 1 tablet by mouth every 6 (six) hours as needed for moderate pain. Qty: 30 tablet, Refills: 0    piperacillin-tazobactam (ZOSYN) 3.375 GM/50ML IVPB Inject 50 mLs (3.375 g total) into the vein every 8 (eight) hours. Qty: 50 mL, Refills: 5      CONTINUE these medications which have NOT CHANGED   Details  aspirin EC 81 MG tablet Take 81 mg by mouth daily.    celecoxib (CELEBREX) 200 MG capsule Take 200 mg by mouth daily as needed for mild pain.    docusate sodium (COLACE) 100 MG capsule Take 100 mg by mouth daily.    DULoxetine (CYMBALTA) 20 MG capsule Take 20 mg by mouth daily.    metFORMIN (GLUCOPHAGE-XR) 500 MG 24 hr tablet Take 500 mg by mouth  daily.    simvastatin (ZOCOR) 20 MG tablet Take 20 mg by mouth daily.    furosemide (LASIX) 40 MG tablet Take 40 mg by mouth daily. Reported on 01/23/2015              Today   CHIEF COMPLAINT:  No issues overnight    VITAL SIGNS:  Blood pressure 150/57, pulse 98, temperature 97.8 F (36.6 C), temperature source Oral, resp. rate 18, height 5\' 6"  (1.676 m), weight 74.526 kg (164 lb 4.8 oz),  SpO2 95 %.   REVIEW OF SYSTEMS:  Review of Systems  Constitutional: Negative for fever, chills and malaise/fatigue.  HENT: Positive for hearing loss. Negative for sore throat.   Eyes: Negative for blurred vision.  Respiratory: Negative for cough, hemoptysis, shortness of breath and wheezing.   Cardiovascular: Negative for chest pain, palpitations and leg swelling.  Gastrointestinal: Negative for nausea, vomiting, abdominal pain, diarrhea and blood in stool.  Genitourinary: Negative for dysuria.  Musculoskeletal: Negative for back pain.  Neurological: Negative for dizziness, tremors and headaches.  Endo/Heme/Allergies: Does not bruise/bleed easily.     PHYSICAL EXAMINATION:  GENERAL:  82 y.o.-year-old patient lying in the bed with no acute distress.  NECK:  Supple, no jugular venous distention. No thyroid enlargement, no tenderness.  LUNGS: Normal breath sounds bilaterally, no wheezing, rales,rhonchi  No use of accessory muscles of respiration.  CARDIOVASCULAR: S1, S2 normal. No murmurs, rubs, or gallops.  ABDOMEN: Soft, non-tender, non-distended. Bowel sounds present. No organomegaly or mass.  EXTREMITIES: No pedal edema, cyanosis, or clubbing.  PSYCHIATRIC: The patient is alert and oriented x 3.  SKIN: No obvious rash, lesion, or ulcer.  Foot is wrapped   DATA REVIEW:   CBC  Recent Labs Lab 02/01/15 0308  WBC 15.7*  HGB 8.3*  HCT 26.5*  PLT 490*    Chemistries   Recent Labs Lab 01/29/15 0515  NA 137  K 4.0  CL 102  CO2 26  GLUCOSE 124*  BUN 17  CREATININE 0.82  CALCIUM 7.6*    Cardiac Enzymes No results for input(s): TROPONINI in the last 168 hours.  Microbiology Results  @[email or web address] @  RADIOLOGY:  No results found.    Management plans discussed with the patient and he is in agreement. Stable for discharge SNF  Patient should follow up with Dr Vickki Muff and Dr Ola Spurr  CODE STATUS:     Code Status Orders        Start     Ordered    01/27/15 1543  Full code   Continuous     01/27/15 1542    Code Status History    Date Active Date Inactive Code Status Order ID Comments User Context   01/21/2015  9:18 PM 01/27/2015  3:42 PM Full Code [id number]  Henreitta Leber, MD Inpatient    Advance Directive Documentation        Most Recent Value   Type of Advance Directive  Healthcare Power of [place], Living will   Pre-existing out of facility DNR order (yellow form or pink MOST form)     "MOST" Form in Place?        TOTAL TIME TAKING CARE OF THIS PATIENT: 35 minutes.    Note: This dictation was prepared with Dragon dictation along with smaller phrase technology. Any transcriptional errors that result from this process are unintentional.  Kathryne Ramella M.D on 02/02/2015 at 8:18 AM  Between 7am to 6pm - Pager - [phone] After 6pm go to [email or web address] - password  [email or web address] [facility]  [place] [facility]  Office  [phone]  CC: Primary care physician; [name], MD

## 2015-02-02 NOTE — Care Management Important Message (Signed)
Important Message  Patient Details  Name: Spencer [name] MRN: [id number] Date of Birth: 1917/07/31   Medicare Important Message Given:  Yes    [name] 02/02/2015, 9:50 AM

## 2015-02-02 NOTE — Progress Notes (Signed)
3 Days Post-Op  Subjective: Patient seen. No real complaints of pain.  Objective: Vital signs in last 24 hours: Temp:  [97.8 F (36.6 C)-98.3 F (36.8 C)] 97.8 F (36.6 C) (01/23 0759) Pulse Rate:  [69-98] 98 (01/23 0759) Resp:  [16-20] 18 (01/23 0759) BP: (119-150)/(49-59) 150/57 mmHg (01/23 0759) SpO2:  [88 %-95 %] 95 % (01/23 0759) Weight:  [74.39 kg (164 lb)-74.526 kg (164 lb 4.8 oz)] 74.526 kg (164 lb 4.8 oz) (01/23 0453) Last BM Date: 01/29/15  Intake/Output from previous day: 01/22 0701 - 01/23 0700 In: 1680.3 [P.O.:477; I.V.:1103.3; IV Piggyback:100] Out: 725 [Urine:725] Intake/Output this shift: Total I/O In: 177.5 [I.V.:177.5] Out: 100 [Urine:100]  Still moderate bleeding on the bandage on the right foot. Upon removal the distal incision is well coapted but along the incision and the arch there is some mild dehiscence with some early necrosis along the incision. No expressible purulence is noted. Still mild erythema and edema in the distal forefoot.  Lab Results:   Recent Labs  02/01/15 0308  WBC 15.7*  HGB 8.3*  HCT 26.5*  PLT 490*   BMET No results for input(s): NA, K, CL, CO2, GLUCOSE, BUN, CREATININE, CALCIUM in the last 72 hours. PT/INR No results for input(s): LABPROT, INR in the last 72 hours. ABG No results for input(s): PHART, HCO3 in the last 72 hours.  Invalid input(s): PCO2, PO2  Studies/Results: No results found.  Anti-infectives: Anti-infectives    Start     Dose/Rate Route Frequency Ordered Stop   02/02/15 0000  piperacillin-tazobactam (ZOSYN) 3.375 GM/50ML IVPB    Comments:  End on 2/3   3.375 g 12.5 mL/hr over 240 Minutes Intravenous Every 8 hours 02/02/15 0818 02/13/15 2359   01/27/15 1400  cefUROXime (ZINACEF) 1.5 g in dextrose 5 % 50 mL IVPB     1.5 g 100 mL/hr over 30 Minutes Intravenous  Once 01/27/15 1348 01/27/15 1419   01/24/15 0500  vancomycin (VANCOCIN) IVPB 750 mg/150 ml premix  Status:  Discontinued     750 mg 150  mL/hr over 60 Minutes Intravenous Every 24 hours 01/24/15 0445 01/25/15 0933   01/24/15 0400  vancomycin (VANCOCIN) IVPB 750 mg/150 ml premix  Status:  Discontinued     750 mg 150 mL/hr over 60 Minutes Intravenous Every 24 hours 01/23/15 0858 01/24/15 0445   01/23/15 0400  vancomycin (VANCOCIN) 1,500 mg in sodium chloride 0.9 % 500 mL IVPB  Status:  Discontinued     1,500 mg 250 mL/hr over 120 Minutes Intravenous Every 24 hours 01/22/15 0901 01/23/15 0858   01/22/15 0400  piperacillin-tazobactam (ZOSYN) IVPB 3.375 g     3.375 g 12.5 mL/hr over 240 Minutes Intravenous 3 times per day 01/21/15 2123     01/22/15 0400  vancomycin (VANCOCIN) 1,500 mg in sodium chloride 0.9 % 500 mL IVPB  Status:  Discontinued     1,500 mg 250 mL/hr over 120 Minutes Intravenous Every 18 hours 01/21/15 2137 01/22/15 0901   01/21/15 2145  vancomycin (VANCOCIN) 500 mg in sodium chloride 0.9 % 100 mL IVPB     500 mg 100 mL/hr over 60 Minutes Intravenous STAT 01/21/15 2134 01/22/15 0123   01/21/15 1815  vancomycin (VANCOCIN) IVPB 1000 mg/200 mL premix     1,000 mg 200 mL/hr over 60 Minutes Intravenous  Once 01/21/15 1806 01/21/15 2005   01/21/15 1815  piperacillin-tazobactam (ZOSYN) IVPB 3.375 g     3.375 g 12.5 mL/hr over 240 Minutes Intravenous  Once 01/21/15 1806  01/22/15 0017      Assessment/Plan: s/p Procedure(s): AMPUTATION FOOT/ TRANSMETATARSAL (Right) Betadine applied to the incision lines followed by a sterile gauze bandage. Internal medicine is planning for transfer to skilled nursing later today. The patient should be stable for transfer as far as his foot goes. Recommend follow-up with Dr. Vickki Muff in 1 week for reevaluation.  LOS: 12 days    Kaliel Bolds W. 02/02/2015

## 2015-02-02 NOTE — Progress Notes (Signed)
Dr Benjie Karvonen requested I put this in the d/c note as addendum Pt will need to be on eliquis 5 mg bid and sliding scale insulin at the facility.

## 2015-02-02 NOTE — Progress Notes (Signed)
EMS staff here to take patient to [facility].

## 2015-02-02 NOTE — Clinical Social Work Placement (Signed)
   CLINICAL SOCIAL WORK PLACEMENT  NOTE  Date:  02/02/2015  Patient Details  Name: Spencer [name] MRN: [id number] Date of Birth: June 15, 1917  Clinical Social Work is seeking post-discharge placement for this patient at the [place] level of care (*CSW will initial, date and re-position this form in  chart as items are completed):  Yes (Patient is a [facility] resident and will transition to another part of their campus.)   Patient/family provided with [facility] Work Department's list of facilities offering this level of care within the geographic area requested by the patient (or if unable, by the patient's family).  Yes   Patient/family informed of their freedom to choose among providers that offer the needed level of care, that participate in Medicare, Medicaid or managed care program needed by the patient, have an available bed and are willing to accept the patient.  Yes   Patient/family informed of [facility]'s ownership interest in [facility] and [facility], as well as of the fact that they are under no obligation to receive care at these facilities.  PASRR submitted to EDS on 01/22/15     PASRR number received on 01/22/15     Existing PASRR number confirmed on       FL2 transmitted to all facilities in geographic area requested by pt/family on 01/22/15     FL2 transmitted to all facilities within larger geographic area on       Patient informed that his/her managed care company has contracts with or will negotiate with certain facilities, including the following:        Yes   Patient/family informed of bed offers received.  Patient chooses bed at  [facility] )     Physician recommends and patient chooses bed at      Patient to be transferred to  [facility] ) on 02/02/15.  Patient to be transferred to facility by  [facility] )     Patient family notified on 02/02/15 of transfer.  Name of family member  notified:   (CSW left patient's daughter Kathlee Nations a voicemail making her aware of D/C today. )     PHYSICIAN       Additional Comment:    _______________________________________________ Loralyn Freshwater, LCSW 02/02/2015, 9:38 AM

## 2015-02-02 NOTE — Progress Notes (Addendum)
Patient is medically stable for D/C to [facility] today. Per Seth Bake admissions coordinator at [facility] patient is going to room 230. RN will call report at [phone] and arrange EMS for transport. Clinical Education officer, museum (CSW) sent D/C Summary, FL2, and D/C Packet to [employer] via [employer]. Patient is aware of above. Patient's granddaughter was at bedside and aware of above. CSW also left patient's daughter Kathlee Nations a voicemail making her aware of above. Please reconsult if future social work needs arise. CSW signing off.   Updated D/C Summary sent with weight bearing status and dressing changes.   Blima Rich, LCSW [phone]

## 2015-02-02 NOTE — Progress Notes (Signed)
Report called to [facility], spoke with Marliss Coots, Therapist, sports. Patient to be discharge with PICC per order for long term antibiotic.

## 2015-02-02 NOTE — Progress Notes (Signed)
EMS called for patient  to be discharge to [facility] room 230. Family updated with Discharge.

## 2015-02-02 NOTE — Progress Notes (Signed)
Clinical Education officer, museum (CSW) contacted Therapist, sports at [employer] and made her aware that Eliquis and insulin should be added to patient's med list. CSW faxed MD's progress note with Med orders to RN at [facility]. Patient's daughter Kathlee Nations is aware of above.   Blima Rich, LCSW [phone]

## 2015-02-03 DIAGNOSIS — E11621 Type 2 diabetes mellitus with foot ulcer: Secondary | ICD-10-CM | POA: Diagnosis not present

## 2015-02-03 DIAGNOSIS — Z89431 Acquired absence of right foot: Secondary | ICD-10-CM | POA: Diagnosis not present

## 2015-02-03 DIAGNOSIS — E1142 Type 2 diabetes mellitus with diabetic polyneuropathy: Secondary | ICD-10-CM | POA: Diagnosis not present

## 2015-02-03 DIAGNOSIS — I350 Nonrheumatic aortic (valve) stenosis: Secondary | ICD-10-CM

## 2015-02-03 DIAGNOSIS — I480 Paroxysmal atrial fibrillation: Secondary | ICD-10-CM | POA: Diagnosis not present

## 2015-02-03 LAB — SURGICAL PATHOLOGY

## 2015-02-10 DIAGNOSIS — R197 Diarrhea, unspecified: Secondary | ICD-10-CM | POA: Diagnosis not present

## 2015-02-13 DIAGNOSIS — S91301A Unspecified open wound, right foot, initial encounter: Secondary | ICD-10-CM | POA: Diagnosis not present

## 2015-02-17 ENCOUNTER — Ambulatory Visit: Payer: Medicare Other | Admitting: Internal Medicine

## 2015-02-18 ENCOUNTER — Encounter: Payer: BLUE CROSS/BLUE SHIELD | Attending: Internal Medicine | Admitting: Internal Medicine

## 2015-02-18 DIAGNOSIS — E1151 Type 2 diabetes mellitus with diabetic peripheral angiopathy without gangrene: Secondary | ICD-10-CM | POA: Diagnosis not present

## 2015-02-18 DIAGNOSIS — T8753 Necrosis of amputation stump, right lower extremity: Secondary | ICD-10-CM | POA: Diagnosis not present

## 2015-02-18 DIAGNOSIS — I10 Essential (primary) hypertension: Secondary | ICD-10-CM | POA: Diagnosis not present

## 2015-02-18 DIAGNOSIS — Z7984 Long term (current) use of oral hypoglycemic drugs: Secondary | ICD-10-CM | POA: Insufficient documentation

## 2015-02-18 DIAGNOSIS — Z87891 Personal history of nicotine dependence: Secondary | ICD-10-CM | POA: Diagnosis not present

## 2015-02-18 DIAGNOSIS — M1990 Unspecified osteoarthritis, unspecified site: Secondary | ICD-10-CM | POA: Diagnosis not present

## 2015-02-18 DIAGNOSIS — E11621 Type 2 diabetes mellitus with foot ulcer: Secondary | ICD-10-CM | POA: Insufficient documentation

## 2015-02-18 DIAGNOSIS — L97513 Non-pressure chronic ulcer of other part of right foot with necrosis of muscle: Secondary | ICD-10-CM | POA: Diagnosis not present

## 2015-02-18 DIAGNOSIS — Y835 Amputation of limb(s) as the cause of abnormal reaction of the patient, or of later complication, without mention of misadventure at the time of the procedure: Secondary | ICD-10-CM | POA: Diagnosis not present

## 2015-02-19 ENCOUNTER — Other Ambulatory Visit
Admission: RE | Admit: 2015-02-19 | Discharge: 2015-02-19 | Disposition: A | Discharge status: Home / Self Care | Payer: BLUE CROSS/BLUE SHIELD | Source: Ambulatory Visit | Attending: Internal Medicine | Admitting: Internal Medicine

## 2015-02-19 DIAGNOSIS — Z029 Encounter for administrative examinations, unspecified: Secondary | ICD-10-CM | POA: Diagnosis present

## 2015-02-19 NOTE — Progress Notes (Signed)
Spencer, [name] ([id number]) Visit Report for 02/18/2015 Abuse/Suicide Risk Screen Details Patient Name: Spencer [name], Spencer [name] Date of Service: 02/18/2015 3:00 PM Medical Record Number: [id number] Patient Account Number: [id number] Date of Birth/Sex: 12-09-1917 (82 y.o. Male) Treating RN: Baruch Gouty, RN, BSN, Velva Harman Primary Care Physician: [name] Other Clinician: Referring Physician: [name] Treating Physician/Extender: Ricard Dillon Weeks in Treatment: 0 Abuse/Suicide Risk Screen Items Answer ABUSE/SUICIDE RISK SCREEN: Has anyone close to you tried to hurt or harm you recentlyo No Do you feel uncomfortable with anyone in your familyo No Has anyone forced you do things that you didnot want to doo No Do you have any thoughts of harming yourselfo No Patient displays signs or symptoms of abuse and/or neglect. No Electronic Signature(s) Signed: 02/18/2015 5:19:57 PM By: Regan Lemming BSN, RN Entered By: Regan Lemming on 02/18/2015 14:53:17 Spencer [name] ([id number]) -------------------------------------------------------------------------------- Activities of Daily Living Details Patient Name: Spencer [name] Date of Service: 02/18/2015 3:00 PM Medical Record Number: [id number] Patient Account Number: [id number] Date of Birth/Sex: 1917-03-25 (82 y.o. Male) Treating RN: Baruch Gouty, RN, BSN, Velva Harman Primary Care Physician: [name] Other Clinician: Referring Physician: [name] Treating Physician/Extender: Ricard Dillon Weeks in Treatment: 0 Activities of Daily Living Items Answer Activities of Daily Living (Please select one for each item) Drive Automobile Completely Able Take Medications Need Assistance Use Telephone Need Assistance Care for Appearance Need Assistance Use Toilet Need Assistance Bath / Shower Need Assistance Dress Self Need Assistance Feed Self Need Assistance Walk Need Assistance Get In / Out Bed Need Assistance Housework Need Assistance Prepare Meals  Need Assistance Handle Money Need Assistance Shop for Self Need Assistance Electronic Signature(s) Signed: 02/18/2015 5:19:57 PM By: Regan Lemming BSN, RN Entered By: Regan Lemming on 02/18/2015 14:53:48 Spencer [name] ([id number]) -------------------------------------------------------------------------------- Education Assessment Details Patient Name: Spencer [name] Date of Service: 02/18/2015 3:00 PM Medical Record Number: [id number] Patient Account Number: [id number] Date of Birth/Sex: [birth year] (82 y.o. Male) Treating RN: Baruch Gouty, RN, BSN, [place] Primary Care Physician: [name] Other Clinician: Referring Physician: [name] Treating Physician/Extender: Tito Dine in Treatment: 0 Primary Learner Assessed: Patient Learning Preferences/Education Level/Primary Language Learning Preference: Explanation Highest Education Level: College or Above Preferred Language: English Cognitive Barrier Assessment/Beliefs Language Barrier: No Physical Barrier Assessment Impaired Vision: Yes Glasses Impaired Hearing: Yes Hearing Aid Decreased Hand dexterity: No Knowledge/Comprehension Assessment Knowledge Level: Medium Comprehension Level: Medium Ability to understand written Medium instructions: Ability to understand verbal Medium instructions: Motivation Assessment Anxiety Level: Calm Cooperation: Cooperative Interest in Health Problems: Asks Questions Perception: Coherent Willingness to Engage in Self- Medium Management Activities: Readiness to Engage in Self- Medium Management Activities: Electronic Signature(s) Signed: 02/18/2015 5:19:57 PM By: Regan Lemming BSN, RN Entered By: Regan Lemming on 02/18/2015 14:54:19 Spencer [name] ([id number]) -------------------------------------------------------------------------------- Fall Risk Assessment Details Patient Name: Spencer [name] Date of Service: 02/18/2015 3:00 PM Medical Record Number: [id number] Patient  Account Number: [id number] Date of Birth/Sex: 23-Oct-1917 (82 y.o. Male) Treating RN: Baruch Gouty, RN, BSN, [place] Primary Care Physician: [name] Other Clinician: Referring Physician: [name] Treating Physician/Extender: Tito Dine in Treatment: 0 Fall Risk Assessment Items Have you had 2 or more falls in the last 12 monthso 0 Yes Have you had any fall that resulted in injury in the last 12 monthso 0 Yes FALL RISK ASSESSMENT: History of falling - immediate or within 3 months 25 Yes Secondary diagnosis 0 No Ambulatory aid None/bed rest/wheelchair/nurse 0 Yes Crutches/cane/walker 0 No Furniture 0 No IV Access/Saline Lock 0 No Gait/Training Normal/bed rest/immobile 0  No Weak 10 Yes Impaired 0 No Mental Status Oriented to own ability 0 Yes Electronic Signature(s) Signed: 02/18/2015 5:19:57 PM By: Regan Lemming BSN, RN Entered By: Regan Lemming on 02/18/2015 14:54:42 Spencer [name] ([id number]) -------------------------------------------------------------------------------- Foot Assessment Details Patient Name: Spencer [name] Date of Service: 02/18/2015 3:00 PM Medical Record Number: [id number] Patient Account Number: [id number] Date of Birth/Sex: 09-08-1917 (82 y.o. Male) Treating RN: Baruch Gouty, RN, BSN, [place] Primary Care Physician: [name] Other Clinician: Referring Physician: [name] Treating Physician/Extender: Ricard Dillon Weeks in Treatment: 0 Foot Assessment Items Site Locations + = Sensation present, - = Sensation absent, C = Callus, U = Ulcer R = Redness, W = Warmth, M = Maceration, PU = Pre-ulcerative lesion F = Fissure, S = Swelling, D = Dryness Assessment Right: Left: Other Deformity: No No Prior Foot Ulcer: No No Prior Amputation: No No Charcot Joint: No No Ambulatory Status: Ambulatory With Help Assistance Device: Wheelchair Gait: Administrator, arts) Signed: 02/18/2015 5:19:57 PM By: Regan Lemming BSN, RN Entered By:  Regan Lemming on 02/18/2015 14:57:11 Spencer [name] ([id number]) -------------------------------------------------------------------------------- Nutrition Risk Assessment Details Patient Name: Spencer [name] Date of Service: 02/18/2015 3:00 PM Medical Record Number: [id number] Patient Account Number: [id number] Date of Birth/Sex: 27-Aug-1917 (82 y.o. Male) Treating RN: Baruch Gouty, RN, BSN, [place] Primary Care Physician: [name] Other Clinician: Referring Physician: [name] Treating Physician/Extender: Ricard Dillon Weeks in Treatment: 0 Height (in): Weight (lbs): Body Mass Index (BMI): Nutrition Risk Assessment Items NUTRITION RISK SCREEN: I have an illness or condition that made me change the kind and/or 0 No amount of food I eat I eat fewer than two meals per day 0 No I eat few fruits and vegetables, or milk products 0 No I have three or more drinks of beer, liquor or wine almost every day 0 No I have tooth or mouth problems that make it hard for me to eat 0 No I don't always have enough money to buy the food I need 0 No I eat alone most of the time 0 No I take three or more different prescribed or over-the-counter drugs a 0 No day Without wanting to, I have lost or gained 10 pounds in the last six 2 Yes months I am not always physically able to shop, cook and/or feed myself 0 No Nutrition Protocols Good Risk Protocol 0 No interventions needed Provide education on Moderate Risk Protocol 0 nutrition Electronic Signature(s) Signed: 02/18/2015 5:19:57 PM By: Regan Lemming BSN, RN Entered By: Regan Lemming on 02/18/2015 14:54:55

## 2015-02-19 NOTE — Progress Notes (Signed)
FLABIO, [place] ([id number]) Visit Report for 02/18/2015 Allergy List Details Patient Name: Spencer [name], Spencer [name] Date of Service: 02/18/2015 3:00 PM Medical Record Number: [id number] Patient Account Number: [id number] Date of Birth/Sex: 09/09/1917 (82 y.o. Male) Treating RN: Baruch Gouty, RN, BSN, Velva Harman Primary Care Physician: [name] Other Clinician: Referring Physician: [name] Treating Physician/Extender: Ricard Dillon Weeks in Treatment: 0 Allergies Active Allergies No known allergies Allergy Notes Electronic Signature(s) Signed: 02/18/2015 5:19:57 PM By: Regan Lemming BSN, RN Entered By: Regan Lemming on 02/18/2015 14:57:36 Spencer [name] ([id number]) -------------------------------------------------------------------------------- [place] Details Patient Name: Spencer [name] Date of Service: 02/18/2015 3:00 PM Medical Record Number: [id number] Patient Account Number: [id number] Date of Birth/Sex: [birth year] (82 y.o. Male) Treating RN: Baruch Gouty, RN, BSN, [place] Primary Care Physician: [name] Other Clinician: Referring Physician: [name] Treating Physician/Extender: Tito Dine in Treatment: 0 Visit Information Patient Arrived: Wheel Chair Arrival Time: 14:46 Accompanied By: son Transfer Assistance: EasyPivot Patient Lift Patient Identification Verified: Yes Secondary Verification Process Yes Completed: Patient Requires Transmission- No Based Precautions: Patient Has Alerts: Yes Patient Alerts: Patient on Blood Thinner Eliquis Electronic Signature(s) Signed: 02/18/2015 5:19:57 PM By: Regan Lemming BSN, RN Entered By: Regan Lemming on 02/18/2015 14:52:38 Spencer [name] ([id number]) -------------------------------------------------------------------------------- Clinic Level of Care Assessment Details Patient Name: Spencer [name] Date of Service: 02/18/2015 3:00 PM Medical Record Number: [id number] Patient Account Number: [id number] Date  of Birth/Sex: 1917-12-13 (82 y.o. Male) Treating RN: Baruch Gouty, RN, BSN, [place] Primary Care Physician: [name] Other Clinician: Referring Physician: [name] Treating Physician/Extender: Tito Dine in Treatment: 0 Clinic Level of Care Assessment Items TOOL 1 Quantity Score []  - Use when EandM and Procedure is performed on INITIAL visit 0 ASSESSMENTS - Nursing Assessment / Reassessment X - General Physical Exam (combine w/ comprehensive assessment (listed just 1 20 below) when performed on new pt. evals) X - Comprehensive Assessment (HX, ROS, Risk Assessments, Wounds Hx, etc.) 1 25 ASSESSMENTS - Wound and Skin Assessment / Reassessment []  - Dermatologic / Skin Assessment (not related to wound area) 0 ASSESSMENTS - Ostomy and/or Continence Assessment and Care []  - Incontinence Assessment and Management 0 []  - Ostomy Care Assessment and Management (repouching, etc.) 0 PROCESS - Coordination of Care X - Simple Patient / Family Education for ongoing care 1 15 []  - Complex (extensive) Patient / Family Education for ongoing care 0 X - Staff obtains Programmer, systems, Records, Test Results / Process Orders 1 10 []  - Staff telephones HHA, Nursing Homes / Clarify orders / etc 0 []  - Routine Transfer to another Facility (non-emergent condition) 0 []  - Routine Hospital Admission (non-emergent condition) 0 X - New Admissions / Biomedical engineer / Ordering NPWT, Apligraf, etc. 1 15 []  - Emergency Hospital Admission (emergent condition) 0 PROCESS - Special Needs []  - Pediatric / Minor Patient Management 0 []  - Isolation Patient Management 0 Kregel, Hollice Espy ([id number]) []  - Hearing / Language / Visual special needs 0 []  - Assessment of Community assistance (transportation, D/C planning, etc.) 0 []  - Additional assistance / Altered mentation 0 []  - Support Surface(s) Assessment (bed, cushion, seat, etc.) 0 INTERVENTIONS - Miscellaneous []  - External ear exam 0 []  - Patient  Transfer (multiple staff / Civil Service fast streamer / Similar devices) 0 []  - Simple Staple / Suture removal (25 or less) 0 []  - Complex Staple / Suture removal (26 or more) 0 []  - Hypo/Hyperglycemic Management (do not check if billed separately) 0 []  - Ankle / Brachial Index (ABI) - do not check if billed separately  0 Has the patient been seen at the hospital within the last three years: Yes Total Score: 85 Level Of Care: New/Established - Level 3 Electronic Signature(s) Signed: 02/18/2015 4:59:49 PM By: Regan Lemming BSN, RN Entered By: Regan Lemming on 02/18/2015 16:59:49 Spencer [name] ([id number]) -------------------------------------------------------------------------------- Encounter Discharge Information Details Patient Name: Spencer [name] Date of Service: 02/18/2015 3:00 PM Medical Record Number: [id number] Patient Account Number: [id number] Date of Birth/Sex: February 13, 1917 (82 y.o. Male) Treating RN: Baruch Gouty, RN, BSN, [place] Primary Care Physician: [name] Other Clinician: Referring Physician: [name] Treating Physician/Extender: Tito Dine in Treatment: 0 Encounter Discharge Information Items Discharge Pain Level: 0 Discharge Condition: Stable Ambulatory Status: Wheelchair Discharge Destination: Nursing Home Transportation: Other Accompanied By: son Schedule Follow-up Appointment: No Medication Reconciliation completed No and provided to Patient/Care Kenzie Flakes: Provided on Clinical Summary of Care: 02/18/2015 Form Type Recipient Paper Patient [facility] Electronic Signature(s) Signed: 02/18/2015 5:01:24 PM By: Regan Lemming BSN, RN Previous Signature: 02/18/2015 3:58:58 PM Version By: Ruthine Dose Entered By: Regan Lemming on 02/18/2015 17:01:24 Spencer [name] ([id number]) -------------------------------------------------------------------------------- Lower Extremity Assessment Details Patient Name: Spencer [name] Date of Service: 02/18/2015 3:00 PM Medical Record Number:  [id number] Patient Account Number: [id number] Date of Birth/Sex: [birth year] (82 y.o. Male) Treating RN: Baruch Gouty, RN, BSN, [place] Primary Care Physician: [name] Other Clinician: Referring Physician: [name] Treating Physician/Extender: Ricard Dillon Weeks in Treatment: 0 Vascular Assessment Pulses: Posterior Tibial Dorsalis Pedis Palpable: [Left:No] [Right:No] Doppler: [Left:Multiphasic] [Right:Monophasic] Extremity colors, hair growth, and conditions: Extremity Color: [Left:Normal] [Right:Normal] Hair Growth on Extremity: [Left:No] [Right:No] Temperature of Extremity: [Left:Warm] [Right:Warm] Capillary Refill: [Left:< 3 seconds] [Right:< 3 seconds] Toe Nail Assessment Left: Right: Thick: No Discolored: No Deformed: No Improper Length and Hygiene: No Electronic Signature(s) Signed: 02/18/2015 3:20:13 PM By: Regan Lemming BSN, RN Entered By: Regan Lemming on 02/18/2015 15:20:13 Spencer [name] ([id number]) -------------------------------------------------------------------------------- Multi Wound Chart Details Patient Name: Spencer [name] Date of Service: 02/18/2015 3:00 PM Medical Record Number: [id number] Patient Account Number: [id number] Date of Birth/Sex: 01-15-1917 (82 y.o. Male) Treating RN: Baruch Gouty, RN, BSN, [place] Primary Care Physician: [name] Other Clinician: Referring Physician: [name] Treating Physician/Extender: Ricard Dillon Weeks in Treatment: 0 Vital Signs Height(in): 66 Pulse(bpm): 62 Weight(lbs): 160 Blood Pressure 148/44 (mmHg): Body Mass Index(BMI): 26 Temperature(F): 98.3 Respiratory Rate 16 (breaths/min): Photos: [1:No Photos] [2:No Photos] [N/A:N/A] Wound Location: [1:Right Amputation Site - Right Amputation Site - Transmetatarsal] [2:Transmetatarsal - Plantar, Medial] [N/A:N/A] Wounding Event: [1:Surgical Injury] [2:Surgical Injury] [N/A:N/A] 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] [2:the Lower Extremity] Secondary Etiology: [1:Compromised Skin Graft/Flap] [2:Compromised Skin Graft/Flap] [N/A:N/A] Comorbid History: [1:Cataracts, Anemia, Hypertension, Peripheral Hypertension, Peripheral Arterial Disease, Type II Arterial Disease, Type II Diabetes, Osteoarthritis Diabetes, Osteoarthritis] [2:Cataracts, Anemia,] [N/A:N/A] Date Acquired: [1:02/05/2015] [2:02/06/2015] [N/A:N/A] Weeks of Treatment: [1:0] [2:0] [N/A:N/A] Wound Status: [1:Open] [2:Open] [N/A:N/A] Measurements L x W x D 14.5x2x0.1 [2:9x2x0.2] [N/A:N/A] (cm) Area (cm) : [1:22.777] [2:14.137] [N/A:N/A] Volume (cm) : [1:2.278] [2:2.827] [N/A:N/A] % Reduction in Area: [1:0.00%] [2:0.00%] [N/A:N/A] % Reduction in Volume: 0.00% [2:0.00%] [N/A:N/A] Classification: [1:Grade 1] [2:Grade 1] [N/A:N/A] Exudate Amount: [1:Small] [2:Medium] [N/A:N/A] Exudate Type: [1:Serosanguineous] [2:Serosanguineous] [N/A:N/A] Exudate Color: [1:red, brown] [2:red, brown] [N/A:N/A] Wound Margin: [1:Distinct, outline attached Distinct, outline attached N/A] Granulation Amount: [1:None Present (0%)] [2:None Present (0%)] [N/A:N/A] Necrotic Amount: [1:Large (67-100%)] [2:Large (67-100%)] [N/A:N/A] Necrotic Tissue: [1:Eschar, Adherent [place] N/A] Exposed Structures: Fascia: No Fascia: No N/A Fat: No Fat: No Tendon: No Tendon: No Muscle: No Muscle: No  Joint: No Joint: No Bone: No Bone: No Limited to Skin Limited to Skin Breakdown Breakdown Epithelialization: N/A None N/A Periwound Skin Texture: Edema: No Edema: No N/A Excoriation: No Excoriation: No Induration: No Induration: No Callus: No Callus: No Crepitus: No Crepitus: No Fluctuance: No Fluctuance: No Friable: No Friable: No Rash: No Rash: No Scarring: No Scarring: No Periwound Skin Moist: Yes Moist: Yes N/A Moisture: Maceration: No Maceration: No Dry/Scaly: No Dry/Scaly: No Periwound Skin Color: Atrophie  Blanche: No Atrophie Blanche: No N/A Cyanosis: No Cyanosis: No Ecchymosis: No Ecchymosis: No Erythema: No Erythema: No Hemosiderin Staining: No Hemosiderin Staining: No Mottled: No Mottled: No Pallor: No Pallor: No Rubor: No Rubor: No Temperature: No Abnormality No Abnormality N/A Tenderness on No No N/A Palpation: Wound Preparation: Ulcer Cleansing: Ulcer Cleansing: N/A Rinsed/Irrigated with Rinsed/Irrigated with Saline Saline Topical Anesthetic Topical Anesthetic Applied: Other: Lidocaine Applied: Other: lidocaine 4% 4% Treatment Notes Electronic Signature(s) Signed: 02/18/2015 5:19:57 PM By: Regan Lemming BSN, RN Entered By: Regan Lemming on 02/18/2015 15:34:16 Spencer [name] ([id number]) -------------------------------------------------------------------------------- [place] Details Patient Name: Spencer [name] Date of Service: 02/18/2015 3:00 PM Medical Record Number: [id number] Patient Account Number: [id number] Date of Birth/Sex: [birth year] (82 y.o. Male) Treating RN: Baruch Gouty, RN, BSN, [place] Primary Care Physician: [name] Other Clinician: Referring Physician: [name] Treating Physician/Extender: Tito Dine in Treatment: 0 Active Inactive HBO Nursing Diagnoses: Anxiety related to feelings of confinement associated with the hyperbaric oxygen chamber Anxiety related to knowledge deficit of hyperbaric oxygen therapy and treatment procedures Discomfort related to temperature and humidity changes inside hyperbaric chamber Potential for barotraumas to ears, sinuses, teeth, and lungs or cerebral gas embolism related to changes in atmospheric pressure inside hyperbaric oxygen chamber Potential for oxygen toxicity seizures related to delivery of 100% oxygen at an increased atmospheric pressure Potential for pulmonary oxygen toxicity related to delivery of 100% oxygen at an increased atmospheric pressure Goals: Barotrauma will  be prevented during HBO2 Date Initiated: 02/18/2015 Goal Status: Active Patient and/or family will be able to state/discuss factors appropriate to the management of their disease process during treatment Date Initiated: 02/18/2015 Goal Status: Active Patient will tolerate the hyperbaric oxygen therapy treatment Date Initiated: 02/18/2015 Goal Status: Active Patient will tolerate the internal climate of the chamber Date Initiated: 02/18/2015 Goal Status: Active Patient/caregiver will verbalize understanding of HBO goals, rationale, procedures and potential hazards Date Initiated: 02/18/2015 Goal Status: Active Signs and symptoms of pulmonary oxygen toxicity will be recognized and promptly addressed Date Initiated: 02/18/2015 Goal Status: Active Signs and symptoms of seizure will be recognized and promptly addressed ; seizing patients will suffer no harm Date Initiated: 02/18/2015 BEXTON, CHARRON ([id number]) Goal Status: Active Interventions: Administer decongestants, per physician orders, prior to HBO2 Administer the correct therapeutic gas delivery based on the patients needs and limitations, per physician order Assess and provide for patientos comfort related to the hyperbaric environment and equalization of middle ear Assess for signs and symptoms related to adverse events, including but not limited to confinement anxiety, pneumothorax, oxygen toxicity and baurotrauma Assess patient for any history of confinement anxiety Assess patient's knowledge and expectations regarding hyperbaric medicine and provide education related to the hyperbaric environment, goals of treatment and prevention of adverse events Implement protocols to decrease risk of pneumothorax in high risk patients Notes: Abuse / Safety / Falls / Self Care Management Nursing Diagnoses: Impaired home maintenance Impaired physical mobility Knowledge deficit related to abuse or neglect Knowledge deficit related to: safety;  personal, health (wound), emergency Potential  for falls Potential for injury related to abuse or neglect Self care deficit: actual or potential Goals: Patient will remain injury free Date Initiated: 02/18/2015 Goal Status: Active Patient/caregiver will verbalize understanding of skin care regimen Date Initiated: 02/18/2015 Goal Status: Active Patient/caregiver will verbalize/demonstrate measure taken to improve self care Date Initiated: 02/18/2015 Goal Status: Active Patient/caregiver will verbalize/demonstrate measures taken to improve the patient's personal safety Date Initiated: 02/18/2015 Goal Status: Active Patient/caregiver will verbalize/demonstrate measures taken to prevent injury and/or falls Date Initiated: 02/18/2015 Goal Status: Active Patient/caregiver will verbalize/demonstrate understanding of what to do in case of emergency JASTIN, CLUTTS ([id number]) Date Initiated: 02/18/2015 Goal Status: Active Interventions: Assess fall risk on admission and as needed Assess: immobility, friction, shearing, incontinence upon admission and as needed Assess impairment of mobility on admission and as needed per policy Assess self care needs on admission and as needed Patient referred to community resources (specify in notes) Provide education on basic hygiene Provide education on fall prevention Provide education on personal and home safety Provide education on safe transfers Notes: Orientation to the Wound Care Program Nursing Diagnoses: Knowledge deficit related to the wound healing center program Goals: Patient/caregiver will verbalize understanding of the [place] Program Date Initiated: 02/18/2015 Goal Status: Active Interventions: Provide education on orientation to the wound center Notes: Osteomyelitis Nursing Diagnoses: Infection: osteomyelitis Knowledge deficit related to disease process and management Potential for infection: osteomyelitis Goals: Diagnostic  evaluation for osteomyelitis completed as ordered Date Initiated: 02/18/2015 Goal Status: Active Patient/caregiver will verbalize understanding of disease process and disease management Date Initiated: 02/18/2015 Goal Status: Active Patient's osteomyelitis will resolve NORVILLE, NOTT ([id number]) Date Initiated: 02/18/2015 Goal Status: Active Signs and symptoms for osteomyelitis will be recognized and promptly addressed Date Initiated: 02/18/2015 Goal Status: Active Interventions: Assess for signs and symptoms of osteomyelitis resolution every visit Provide education on osteomyelitis Treatment Activities: Consult for HBO : 02/18/2015 Notes: Peripheral Neuropathy Nursing Diagnoses: Knowledge deficit related to disease process and management of peripheral neurovascular dysfunction Potential alteration in peripheral tissue perfusion (select prior to confirmation of diagnosis) Goals: Patient/caregiver will verbalize understanding of disease process and disease management Date Initiated: 02/18/2015 Goal Status: Active Interventions: Assess signs and symptoms of neuropathy upon admission and as needed Provide education on Management of Neuropathy and Related Ulcers Provide education on Management of Neuropathy upon discharge from the [place] for HBO Notes: Wound/Skin Impairment Nursing Diagnoses: Impaired tissue integrity Knowledge deficit related to smoking impact on wound healing Knowledge deficit related to ulceration/compromised skin integrity Goals: Patient/caregiver will verbalize understanding of skin care regimen Date Initiated: 02/18/2015 Goal Status: Active Ulcer/skin breakdown will have a volume reduction of 30% by week 4 FIDEL, TOTA ([id number]) Date Initiated: 02/18/2015 Goal Status: Active Ulcer/skin breakdown will have a volume reduction of 50% by week 8 Date Initiated: 02/18/2015 Goal Status: Active Ulcer/skin breakdown will have a volume reduction of 80% by  week 12 Date Initiated: 02/18/2015 Goal Status: Active Ulcer/skin breakdown will heal within 14 weeks Date Initiated: 02/18/2015 Goal Status: Active Interventions: Assess patient/caregiver ability to obtain necessary supplies Assess patient/caregiver ability to perform ulcer/skin care regimen upon admission and as needed Assess ulceration(s) every visit Provide education on ulcer and skin care Screen for HBO Treatment Activities: Skin care regimen initiated : 02/18/2015 Topical wound management initiated : 02/18/2015 Notes: Electronic Signature(s) Signed: 02/18/2015 5:19:57 PM By: Regan Lemming BSN, RN Entered By: Regan Lemming on 02/18/2015 15:33:37 Spencer [name] ([id number]) -------------------------------------------------------------------------------- Pain Assessment Details Patient Name: Spencer [name] Date of Service: 02/18/2015  3:00 PM Medical Record Number: [id number] Patient Account Number: [id number] Date of Birth/Sex: Jan 21, 1917 (82 y.o. Male) Treating RN: Baruch Gouty, RN, BSN, [place] Primary Care Physician: [name] Other Clinician: Referring Physician: [name] Treating Physician/Extender: Tito Dine in Treatment: 0 Active Problems Location of Pain Severity and Description of Pain Patient Has Paino No Site Locations Pain Management and Medication Current Pain Management: Electronic Signature(s) Signed: 02/18/2015 5:19:57 PM By: Regan Lemming BSN, RN Entered By: Regan Lemming on 02/18/2015 14:53:07 Spencer [name] ([id number]) -------------------------------------------------------------------------------- Patient/Caregiver Education Details Patient Name: Spencer [name] Date of Service: 02/18/2015 3:00 PM Medical Record Number: [id number] Patient Account Number: [id number] Date of Birth/Gender: 01/03/1918 (82 y.o. Male) Treating RN: Baruch Gouty, RN, BSN, [place] Primary Care Physician: [name] Other Clinician: Referring Physician: [name] Treating  Physician/Extender: Tito Dine in Treatment: 0 Education Assessment Education Provided To: Patient and Caregiver son/snf Education Topics Provided Basic Hygiene: Methods: Explain/Verbal Responses: State content correctly Infection: Methods: Explain/Verbal Responses: State content correctly Peripheral Neuropathy: Methods: Explain/Verbal Responses: State content correctly Safety: Methods: Explain/Verbal Responses: State content correctly [facility] [place]: Methods: Explain/Verbal Responses: State content correctly Wound/Skin Impairment: Methods: Explain/Verbal Responses: State content correctly Electronic Signature(s) Signed: 02/18/2015 5:02:08 PM By: Regan Lemming BSN, RN Entered By: Regan Lemming on 02/18/2015 17:02:07 Spencer [name] ([id number]) -------------------------------------------------------------------------------- Wound Assessment Details Patient Name: Spencer [name] Date of Service: 02/18/2015 3:00 PM Medical Record Number: [id number] Patient Account Number: [id number] Date of Birth/Sex: Jan 02, 1918 (82 y.o. Male) Treating RN: Baruch Gouty, RN, BSN, [place] Primary Care Physician: [name] Other Clinician: Referring Physician: [name] Treating Physician/Extender: Ricard Dillon Weeks in Treatment: 0 Wound Status Wound Number: 1 Primary Diabetic Wound/Ulcer of the Lower Etiology: Extremity Wound Location: Right Amputation Site - Transmetatarsal Secondary Compromised Skin Graft/Flap Etiology: Wounding Event: Surgical Injury Wound Open Date Acquired: 02/05/2015 Status: Weeks Of Treatment: 0 Comorbid Cataracts, Anemia, Hypertension, Clustered Wound: No History: Peripheral Arterial Disease, Type II Diabetes, Osteoarthritis Photos Photo Uploaded By: Regan Lemming on 02/18/2015 17:16:00 Wound Measurements Length: (cm) 14.5 Width: (cm) 2 Depth: (cm) 0.1 Area: (cm) 22.777 Volume: (cm) 2.278 % Reduction in Area: 0% %  Reduction in Volume: 0% Tunneling: No Undermining: No Wound Description Classification: Grade 1 Wound Margin: Distinct, outline attached Exudate Amount: Small Exudate Type: Serosanguineous Exudate Color: red, brown Foul Odor After Cleansing: No Wound Bed Granulation Amount: None Present (0%) Exposed Structure Necrotic Amount: Large (67-100%) Fascia Exposed: No Tunnell, Brodyn ([id number]) Necrotic Quality: Eschar, Adherent [place] Fat Layer Exposed: No Tendon Exposed: No Muscle Exposed: No Joint Exposed: No Bone Exposed: No Limited to Skin Breakdown Periwound Skin Texture Texture Color No Abnormalities Noted: No No Abnormalities Noted: No Callus: No Atrophie Blanche: No Crepitus: No Cyanosis: No Excoriation: No Ecchymosis: No Fluctuance: No Erythema: No Friable: No Hemosiderin Staining: No Induration: No Mottled: No Localized Edema: No Pallor: No Rash: No Rubor: No Scarring: No Temperature / Pain Moisture Temperature: No Abnormality No Abnormalities Noted: No Dry / Scaly: No Maceration: No Moist: Yes Wound Preparation Ulcer Cleansing: Rinsed/Irrigated with Saline Topical Anesthetic Applied: Other: Lidocaine 4%, Treatment Notes Wound #1 (Right Amputation Site - Transmetatarsal) 1. Cleansed with: Clean wound with Normal Saline 4. Dressing Applied: Aquacel Ag 5. Secondary Dressing Applied Guaze, ABD and kerlix/Conform 7. Secured with Recruitment consultant) Signed: 02/18/2015 3:21:54 PM By: Regan Lemming BSN, RN Entered By: Regan Lemming on 02/18/2015 15:21:53 Spencer [name] ([id number]) -------------------------------------------------------------------------------- Wound Assessment Details Patient Name: Spencer [name] Date of Service: 02/18/2015 3:00 PM Medical Record Number: [id number]  Patient Account Number: [id number] Date of Birth/Sex: February 27, 1917 (82 y.o. Male) Treating RN: Baruch Gouty, RN, BSN, [place] Primary Care Physician: [name] Other  Clinician: Referring Physician: [name] Treating Physician/Extender: Ricard Dillon Weeks in Treatment: 0 Wound Status Wound Number: 2 Primary Diabetic Wound/Ulcer of the Lower Etiology: Extremity Wound Location: Right Amputation Site - Transmetatarsal - Plantar, Secondary Compromised Skin Graft/Flap Medial Etiology: Wounding Event: Surgical Injury Wound Open Status: Date Acquired: 02/06/2015 Comorbid Cataracts, Anemia, Hypertension, Weeks Of Treatment: 0 History: Peripheral Arterial Disease, Type II Clustered Wound: No Diabetes, Osteoarthritis Photos Photo Uploaded By: Regan Lemming on 02/18/2015 17:16:14 Wound Measurements Length: (cm) 9 Width: (cm) 2 Depth: (cm) 0.2 Area: (cm) 14.137 Volume: (cm) 2.827 % Reduction in Area: 0% % Reduction in Volume: 0% Epithelialization: None Tunneling: No Undermining: No Wound Description Classification: Grade 1 Wound Margin: Distinct, outline attached Exudate Amount: Medium Exudate Type: Serosanguineous Exudate Color: red, brown Foul Odor After Cleansing: No Wound Bed Granulation Amount: None Present (0%) Exposed Structure Necrotic Amount: Large (67-100%) Fascia Exposed: No Harig, Agron ([id number]) Necrotic Quality: Eschar, Adherent Slough Fat Layer Exposed: No Tendon Exposed: No Muscle Exposed: No Joint Exposed: No Bone Exposed: No Limited to Skin Breakdown Periwound Skin Texture Texture Color No Abnormalities Noted: No No Abnormalities Noted: No Callus: No Atrophie Blanche: No Crepitus: No Cyanosis: No Excoriation: No Ecchymosis: No Fluctuance: No Erythema: No Friable: No Hemosiderin Staining: No Induration: No Mottled: No Localized Edema: No Pallor: No Rash: No Rubor: No Scarring: No Temperature / Pain Moisture Temperature: No Abnormality No Abnormalities Noted: No Dry / Scaly: No Maceration: No Moist: Yes Wound Preparation Ulcer Cleansing: Rinsed/Irrigated with Saline Topical  Anesthetic Applied: Other: lidocaine 4%, Treatment Notes Wound #2 (Right, Medial, Plantar Amputation Site - Transmetatarsal) 1. Cleansed with: Clean wound with Normal Saline 4. Dressing Applied: Aquacel Ag 5. Secondary Dressing Applied Guaze, ABD and kerlix/Conform 7. Secured with Recruitment consultant) Signed: 02/18/2015 5:19:57 PM By: Regan Lemming BSN, RN Entered By: Regan Lemming on 02/18/2015 15:28:46 Spencer [name] ([id number]) -------------------------------------------------------------------------------- Vitals Details Patient Name: Spencer [name] Date of Service: 02/18/2015 3:00 PM Medical Record Number: [id number] Patient Account Number: [id number] Date of Birth/Sex: [birth year] (82 y.o. Male) Treating RN: Afful, RN, BSN, [place] Primary Care Physician: [name] Other Clinician: Referring Physician: [name] Treating Physician/Extender: Tito Dine in Treatment: 0 Vital Signs Time Taken: 15:05 Temperature (F): 98.3 Height (in): 66 Pulse (bpm): 62 Source: Stated Respiratory Rate (breaths/min): 16 Weight (lbs): 160 Blood Pressure (mmHg): 148/44 Source: Stated Reference Range: 80 - 120 mg / dl Body Mass Index (BMI): 25.8 Electronic Signature(s) Signed: 02/18/2015 5:19:57 PM By: Regan Lemming BSN, RN Entered By: Regan Lemming on 02/18/2015 15:05:57

## 2015-02-19 NOTE — Progress Notes (Signed)
Spencer [name], Spencer [name] ([id number]) Visit Report for 02/18/2015 Chief Complaint Document Details Patient Name: Spencer [name], Spencer [name] Date of Service: 02/18/2015 3:00 PM Medical Record Number: [id number] Patient Account Number: [id number] Date of Birth/Sex: [birth year] (82 y.o. Male) Treating RN: Baruch Gouty, RN, BSN, Velva Harman Primary Care Physician: [name] Other Clinician: Referring Physician: [name] Treating Physician/Extender: Tito Dine in Treatment: 0 Information Obtained from: Patient Chief Complaint Patient is here for review of a nonhealing area on a right transmetatarsal amputation site. He is a type II diabetic on oral agents Electronic Signature(s) Signed: 02/18/2015 5:51:40 PM By: Linton Ham MD Entered By: Linton Ham on 02/18/2015 16:48:56 Spencer [name] ([id number]) -------------------------------------------------------------------------------- Debridement Details Patient Name: Spencer [name] Date of Service: 02/18/2015 3:00 PM Medical Record Number: [id number] Patient Account Number: [id number] Date of Birth/Sex: 09-14-1917 (82 y.o. Male) Treating RN: Baruch Gouty, RN, BSN, [place] Primary Care Physician: [name] Other Clinician: Referring Physician: [name] Treating Physician/Extender: Tito Dine in Treatment: 0 Debridement Performed for Wound #2 Right,Medial,Plantar Amputation Site - Transmetatarsal Assessment: Performed By: Physician Ricard Dillon, MD Debridement: Debridement Pre-procedure Yes Verification/Time Out Taken: Start Time: 15:30 Pain Control: Lidocaine 4% Topical Solution Level: Skin/Subcutaneous Tissue Total Area Debrided (L x 9 (cm) x 2 (cm) = 18 (cm) W): Tissue and other Non-Viable, Eschar, Fibrin/Slough, Subcutaneous material debrided: Instrument: Blade, Forceps, Scissors Bleeding: Minimum Hemostasis Achieved: Pressure End Time: 15:40 Procedural Pain: 0 Post Procedural Pain: 0 Response to Treatment:  Procedure was tolerated well Post Debridement Measurements of Total Wound Length: (cm) 9 Width: (cm) 2 Depth: (cm) 0.2 Volume: (cm) 2.827 Post Procedure Diagnosis Same as Pre-procedure Electronic Signature(s) Signed: 02/18/2015 5:19:57 PM By: Regan Lemming BSN, RN Signed: 02/18/2015 5:51:40 PM By: Linton Ham MD Entered By: Linton Ham on 02/18/2015 16:46:44 Spencer [name] ([id number]) -------------------------------------------------------------------------------- HPI Details Patient Name: Spencer [name] Date of Service: 02/18/2015 3:00 PM Medical Record Number: [id number] Patient Account Number: [id number] Date of Birth/Sex: November 15, 1917 (82 y.o. Male) Treating RN: Baruch Gouty, RN, BSN, [place] Primary Care Physician: [name] Other Clinician: Referring Physician: [name] Treating Physician/Extender: Ricard Dillon Weeks in Treatment: 0 History of Present Illness HPI Description: VENOUS STUDIES: ARTERIAL STUDIES: 01/27/15 arteriogram right common femoral profunda femoris is widely patent. SFA is patent. 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 Anterior tibial demonstrates multiple high- grade stenosis with several short segmental occlusions CULTURES: 01/23/15; methicillin sensitive staph aureus Radiology; 01/21/15; soft tissue swelling of the right foot with scattered foci of soft tissue gas. CT SCAN MRI; ANTIBIOTICS PRESCRIBED; 02/18/15 this is a 82 year old man who lives independently at the [facility]. He was relatively well up until the day before a complex hospitalization from 1/11 through 02/02/15. He apparently had a fall the day before and was noted to have right foot cellulitis in a diabetic foot ulcer with gangrene in the first toe. He was admitted to hospital requiring IV antibiotics for methicillin sensitive staph aureus. He  underwent a surgical debridement of necrotic tissue and again on 1/20. He underwent an arteriogram and a PCI to the right anterior tibial artery. He completed his IV antibiotics on February 3. He was taken to the OR on 1/20 for a right first ray amputation however during this operation it was elected to do a right transmetatarsal amputation. He apparently saw his podiatric surgeon Dr. Vickki Muff last week and was referred here for consideration of HBO. In the interim the  medial aspect of his surgical wound is completely dehisced. The patient is in the nursing home part of the [facility] and is non-weightbearing. Electronic Signature(s) Signed: 02/18/2015 5:51:40 PM By: Linton Ham MD Entered By: Linton Ham on 02/18/2015 16:59:38 Spencer [name] ([id number]) -------------------------------------------------------------------------------- Physical Exam Details Patient Name: Spencer [name] Date of Service: 02/18/2015 3:00 PM Medical Record Number: [id number] Patient Account Number: [id number] Date of Birth/Sex: 10-Oct-1917 (82 y.o. Male) Treating RN: Baruch Gouty, RN, BSN, [place] Primary Care Physician: [name] Other Clinician: Referring Physician: [name] Treating Physician/Extender: Ricard Dillon Weeks in Treatment: 0 Constitutional Supine Blood Pressure is within target range for patient.. Pulse regular and within target range for patient.Marland Kitchen Respirations regular, non-labored and within target range.. Temperature is normal and within the target range for the patient.. Patient's appearance is neat and clean. Appears in no acute distress. Well nourished and well developed.Marland Kitchen Respiratory Respiratory effort is easy and symmetric bilaterally. Rate is normal at rest and on room air.. Bilateral breath sounds are clear and equal in all lobes with no wheezes, rales or rhonchi.. Cardiovascular The patient has a harsh 4/6 systolic ejection murmur which radiates into both carotids.  Femoral arteries without bruits and pulses strong.. Pedal pulses absent bilaterally. His popliteal pulses palpable. Notes Wound exam; right foot. The patient is undergone a right transmetatarsal amputation. Much of his surgical incision/flap is closed however medially there is an extensive area of dehiscence. I removed the sutures that were still in place as they were serving no useful purpose. Unfortunately the tissue here is not that viable this probes deeply. There may be an underlying hematoma. I did a deep culture of the wound but did not alter antibiotics. Electronic Signature(s) Signed: 02/18/2015 5:51:40 PM By: Linton Ham MD Entered By: Linton Ham on 02/18/2015 17:02:13 Spencer [name] ([id number]) -------------------------------------------------------------------------------- Physician Orders Details Patient Name: Spencer [name] Date of Service: 02/18/2015 3:00 PM Medical Record Number: [id number] Patient Account Number: [id number] Date of Birth/Sex: 04-04-1917 (82 y.o. Male) Treating RN: Baruch Gouty, RN, BSN, Velva Harman Primary Care Physician: [name] Other Clinician: Referring Physician: [name] Treating Physician/Extender: Tito Dine in Treatment: 0 Verbal / Phone Orders: Yes Clinician: Afful, RN, BSN, Rita Read Back and Verified: Yes Diagnosis Coding Wound Cleansing Wound #1 Right Amputation Site - Transmetatarsal o Clean wound with Normal Saline. Wound #2 Right,Medial,Plantar Amputation Site - Transmetatarsal o Clean wound with Normal Saline. Anesthetic Wound #1 Right Amputation Site - Transmetatarsal o Topical Lidocaine 4% cream applied to wound bed prior to debridement Wound #2 Right,Medial,Plantar Amputation Site - Transmetatarsal o Topical Lidocaine 4% cream applied to wound bed prior to debridement Primary Wound Dressing Wound #1 Right Amputation Site - Transmetatarsal o Aquacel Ag Wound #2 Right,Medial,Plantar Amputation  Site - Transmetatarsal o Aquacel Ag Secondary Dressing Wound #1 Right Amputation Site - Transmetatarsal o Gauze, ABD and Kerlix/Conform Wound #2 Right,Medial,Plantar Amputation Site - Transmetatarsal o Gauze, ABD and Kerlix/Conform Dressing Change Frequency Wound #1 Right Amputation Site - Transmetatarsal o Change dressing every other day. Wound #2 Right,Medial,Plantar Amputation Site - Transmetatarsal o Change dressing every other day. Follow-up Appointments Spencer [name], Spencer [name] ([id number]) Wound #1 Right Amputation Site - Transmetatarsal o Return Appointment in 1 week. Wound #2 Right,Medial,Plantar Amputation Site - Transmetatarsal o Return Appointment in 1 week. Off-Loading Wound #1 Right Amputation Site - Transmetatarsal o Heel suspension boot to: - SAGE Boots Wound #2 Right,Medial,Plantar Amputation Site - Transmetatarsal o Heel suspension boot to: - Best boy) Signed: 02/18/2015 4:04:07 PM By: Regan Lemming  BSN, RN Signed: 02/18/2015 5:51:40 PM By: Linton Ham MD Entered By: Regan Lemming on 02/18/2015 16:04:07 Spencer [name] ([id number]) -------------------------------------------------------------------------------- Problem List Details Patient Name: Spencer [name] Date of Service: 02/18/2015 3:00 PM Medical Record Number: [id number] Patient Account Number: [id number] Date of Birth/Sex: [birth year] (82 y.o. 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 E11.621 Type 2 diabetes mellitus with foot ulcer 02/18/2015 Yes T87.53 Necrosis of amputation stump, right lower extremity 02/18/2015 Yes E11.51 Type 2 diabetes  mellitus with diabetic peripheral 02/18/2015 Yes angiopathy without gangrene Inactive Problems Resolved Problems Electronic Signature(s) Signed: 02/18/2015 5:51:40 PM By: Linton Ham MD Entered By: Linton Ham on 02/18/2015 [place], [place] ([id number]) -------------------------------------------------------------------------------- Progress Note Details Patient Name: Spencer [name] Date of Service: 02/18/2015 3:00 PM Medical Record Number: [id number] Patient Account Number: [id number] Date of Birth/Sex: Apr 09, 1917 (82 y.o. Male) Treating RN: Baruch Gouty, RN, BSN, [place] Primary Care Physician: [name] Other Clinician: Referring Physician: [name] Treating Physician/Extender: Tito Dine in Treatment: 0 Subjective Chief Complaint Information obtained from Patient Patient is here for review of a nonhealing area on a right transmetatarsal amputation site. He is a type II diabetic on oral agents History of Present Illness (HPI) VENOUS STUDIES: ARTERIAL STUDIES: 01/27/15 arteriogram right common femoral profunda femoris is widely patent. SFA is patent. 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 Anterior tibial demonstrates multiple high- grade stenosis with several short segmental occlusions CULTURES: 01/23/15; methicillin sensitive staph aureus Radiology; 01/21/15; soft tissue swelling of the right foot with scattered foci of soft tissue gas. CT SCAN MRI; ANTIBIOTICS PRESCRIBED; 02/18/15 this is a 82 year old man who lives independently at the [facility]. He was relatively well up until the day before a complex hospitalization from 1/11 through 02/02/15. He apparently had a fall the day before and was noted to have right foot cellulitis in a diabetic foot ulcer with gangrene in the first toe. He was admitted to hospital requiring  IV antibiotics for methicillin sensitive staph aureus. He underwent a surgical debridement of necrotic tissue and again on 1/20. He underwent an arteriogram and a PCI to the right anterior tibial artery. He completed his IV antibiotics on February 3. He was taken to the OR on 1/20 for a right first ray amputation however during this operation it was elected to do a right transmetatarsal amputation. He apparently saw his podiatric surgeon Dr. Vickki Muff last week and was referred here for consideration of HBO. In the interim the medial aspect of his surgical wound is completely dehisced. The patient is in the nursing home part of the [facility] and is non-weightbearing. Wound History Patient presents with 1 open wound that has been present for approximately 2 weeks. Patient has been treating wound in the following manner: dry drying. Laboratory tests have not been performed in the last month. Patient reportedly has tested positive for an antibiotic resistant organism. Patient reportedly has had testing performed to evaluate circulation in the legs. Patient experiences the following problems associated with their wounds: infection, swelling. Patient History Information obtained from Patient. Spencer [name], Spencer [name] ([id number]) Allergies No known allergies Family History Diabetes - Mother, Hypertension - Mother, No  family history of Cancer, Heart Disease, Hereditary Spherocytosis, Kidney Disease, Lung Disease, Seizures, Stroke, Thyroid Problems, Tuberculosis. Social History Former smoker, Marital Status - Widowed, Alcohol Use - Rarely, Drug Use - No History, Caffeine Use - Moderate. Medical History Eyes Patient has history of Cataracts Denies history of Glaucoma, Optic Neuritis Ear/Nose/Mouth/Throat Denies history of Chronic sinus problems/congestion, Middle ear problems Hematologic/Lymphatic Patient has history of Anemia Respiratory Denies history of Aspiration, Asthma, Chronic  Obstructive Pulmonary Disease (COPD), Pneumothorax, Sleep Apnea, Tuberculosis Cardiovascular Patient has history of Hypertension, Peripheral Arterial Disease Gastrointestinal Denies history of Cirrhosis , Colitis, Crohn s, Hepatitis A, Hepatitis B, Hepatitis C Endocrine Patient has history of Type II Diabetes Genitourinary Denies history of End Stage Renal Disease Immunological Denies history of Lupus Erythematosus, Raynaud s, Scleroderma Integumentary (Skin) Denies history of History of Burn, History of pressure wounds Musculoskeletal Patient has history of Osteoarthritis Neurologic Denies history of Dementia, Neuropathy, Quadriplegia, Paraplegia, Seizure Disorder Oncologic Denies history of Received Chemotherapy, Received Radiation Patient is treated with Insulin. Blood sugar is not tested. Medical And Surgical History Notes Cardiovascular High cholsterol Review of Systems (ROS) Spencer [name], Spencer [name] ([id number]) Constitutional Symptoms (General Health) The patient has no complaints or symptoms. Eyes Complains or has symptoms of Glasses / Contacts. Ear/Nose/Mouth/Throat The patient has no complaints or symptoms. Hematologic/Lymphatic The patient has no complaints or symptoms. Respiratory The patient has no complaints or symptoms. Gastrointestinal The patient has no complaints or symptoms. Endocrine The patient has no complaints or symptoms. Genitourinary The patient has no complaints or symptoms. Immunological The patient has no complaints or symptoms. Integumentary (Skin) Complains or has symptoms of Wounds. Musculoskeletal The patient has no complaints or symptoms. Neurologic The patient has no complaints or symptoms. Oncologic The patient has no complaints or symptoms. Objective Constitutional Supine Blood Pressure is within target range for patient.. Pulse regular and within target range for patient.Marland Kitchen Respirations regular, non-labored and within target range..  Temperature is normal and within the target range for the patient.. Patient's appearance is neat and clean. Appears in no acute distress. Well nourished and well developed.. Vitals Time Taken: 3:05 PM, Height: 66 in, Source: Stated, Weight: 160 lbs, Source: Stated, BMI: 25.8, Temperature: 98.3 F, Pulse: 62 bpm, Respiratory Rate: 16 breaths/min, Blood Pressure: 148/44 mmHg. Respiratory Respiratory effort is easy and symmetric bilaterally. Rate is normal at rest and on room air.. Bilateral breath sounds are clear and equal in all lobes with no wheezes, rales or rhonchi.. Cardiovascular The patient has a harsh 4/6 systolic ejection murmur which radiates into both carotids. Femoral arteries Spencer [name], Spencer [name] ([id number]) without bruits and pulses strong.. Pedal pulses absent bilaterally. His popliteal pulses palpable. General Notes: Wound exam; right foot. The patient is undergone a right transmetatarsal amputation. Much of his surgical incision/flap is closed however medially there is an extensive area of dehiscence. I removed the sutures that were still in place as they were serving no useful purpose. Unfortunately the tissue here is not that viable this probes deeply. There may be an underlying hematoma. I did a deep culture of the wound but did not alter antibiotics. Integumentary (Hair, Skin) Wound #1 status is Open. Original cause of wound was Surgical Injury. The wound is located on the Right Amputation Site - Transmetatarsal. The wound measures 14.5cm length x 2cm width x 0.1cm depth; 22.777cm^2 area and 2.278cm^3 volume. The wound is limited to skin breakdown. There is no tunneling or undermining noted. There is a small amount of serosanguineous drainage noted. The wound margin is distinct with  the outline attached to the wound base. There is no granulation within the wound bed. There is a large (67-100%) amount of necrotic tissue within the wound bed including Eschar and Adherent  Slough. The periwound skin appearance exhibited: Moist. The periwound skin appearance did not exhibit: Callus, Crepitus, Excoriation, Fluctuance, Friable, Induration, Localized Edema, Rash, Scarring, Dry/Scaly, Maceration, Atrophie Blanche, Cyanosis, Ecchymosis, Hemosiderin Staining, Mottled, Pallor, Rubor, Erythema. Periwound temperature was noted as No Abnormality. Wound #2 status is Open. Original cause of wound was Surgical Injury. The wound is located on the Right,Medial,Plantar Amputation Site - Transmetatarsal. The wound measures 9cm length x 2cm width x 0.2cm depth; 14.137cm^2 area and 2.827cm^3 volume. The wound is limited to skin breakdown. There is no tunneling or undermining noted. There is a medium amount of serosanguineous drainage noted. The wound margin is distinct with the outline attached to the wound base. There is no granulation within the wound bed. There is a large (67-100%) amount of necrotic tissue within the wound bed including Eschar and Adherent Slough. The periwound skin appearance exhibited: Moist. The periwound skin appearance did not exhibit: Callus, Crepitus, Excoriation, Fluctuance, Friable, Induration, Localized Edema, Rash, Scarring, Dry/Scaly, Maceration, Atrophie Blanche, Cyanosis, Ecchymosis, Hemosiderin Staining, Mottled, Pallor, Rubor, Erythema. Periwound temperature was noted as No Abnormality. 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 There was a Skin/Subcutaneous Tissue Debridement  [id number]) debridement with total area of 18 sq cm performed by Ricard Dillon, MD. with the following instrument(s): Blade, Forceps, and Scissors to remove Non-Viable tissue/material including Fibrin/Slough, Eschar, and Subcutaneous after achieving pain control using Lidocaine 4% Topical Solution. A time out was conducted prior to the start of the procedure. A Minimum amount of bleeding was controlled with Pressure. The procedure was tolerated well with a pain level of 0 throughout and a pain level of 0 following the procedure. Post Debridement Measurements: 9cm length x 2cm width x 0.2cm depth; 2.827cm^3 volume. Post procedure Diagnosis Wound #2: Same as Pre-Procedure Plan Wound Cleansing: Wound #1 Right Amputation Site - Transmetatarsal: Clean wound with Normal Saline. Wound #2 Right,Medial,Plantar Amputation Site - Transmetatarsal: Clean wound with Normal Saline. Anesthetic: Wound #1 Right Amputation Site - Transmetatarsal: Topical Lidocaine 4% cream applied to wound bed prior to debridement Wound #2 Right,Medial,Plantar Amputation Site - Transmetatarsal: Topical Lidocaine 4% cream applied to wound bed prior to debridement Primary Wound Dressing: Wound #1 Right Amputation Site - Transmetatarsal: Aquacel Ag Wound #2 Right,Medial,Plantar Amputation Site - Transmetatarsal: Aquacel Ag Secondary Dressing: Wound #1 Right Amputation Site - Transmetatarsal: Gauze, ABD and Kerlix/Conform Wound #2 Right,Medial,Plantar Amputation Site - Transmetatarsal: Gauze, ABD and Kerlix/Conform Dressing Change Frequency: Wound #1 Right Amputation Site - Transmetatarsal: Change dressing every other day. Wound #2 Right,Medial,Plantar Amputation Site - Transmetatarsal: Change dressing every other day. Follow-up Appointments: Wound #1 Right Amputation Site - Transmetatarsal: Return Appointment in 1 week. Wound #2 Right,Medial,Plantar Amputation Site - TransmetatarsalKARAR, Spencer [name]  ([id number]) Return Appointment in 1 week. Off-Loading: Wound #1 Right Amputation Site - Transmetatarsal: Heel suspension boot to: - SAGE Boots Wound #2 Right,Medial,Plantar Amputation Site - Transmetatarsal: Heel suspension boot to: - SAGE Boots #1  we applied adequate cell AG to the dehisced area on the foot, ABDs and Kerlix and Coban we will call in the orders to change the dressing to the nursing home facility nurses. #2 a deep culture of the wound was done however I elected not to put him on antibiotics at this point #3 the patient has known PAD and has just undergone a PCI to the right anterior tibial artery as described. The perfusion in his foot seemed adequate and the area was warm. #4 the patient has clinical aortic stenosis. He has a cardiologist that the current oral clinic we will check and see if he's had an echocardiogram. #5 with regards to hyperbaric oxygen therapy will need to see if the patient has clinically significant aortic stenosis. We would likely need his cardiologist's approval to consider HBO. Also would appear that he is under Medicare A at the nursing home part of the [facility]. This may have insurance issues with regards to hyperbaric oxygen. I have spoken to our staff about researching this further before next week. #6 I would like the patient to see Dr. Vickki Muff again before next week. I am not completely certain that the area is viable and he may require further surgical intervention unfortunately Electronic Signature(s) Signed: 02/18/2015 5:51:40 PM By: Linton Ham MD Entered By: Linton Ham on 02/18/2015 Spencer [name], Spencer [name] ([id number]) -------------------------------------------------------------------------------- ROS/PFSH Details Patient Name: Spencer [name] Date of Service: 02/18/2015 3:00 PM Medical Record Number: [id number] Patient Account Number: [id number] Date of Birth/Sex: 03-08-1917 (82 y.o. Male) Treating RN: Baruch Gouty, RN, BSN,  [place] Primary Care Physician: [name] Other Clinician: Referring Physician: [name] Treating Physician/Extender: Ricard Dillon Weeks in Treatment: 0 Information Obtained From Patient Wound History Do you currently have one or more open woundso Yes How many open wounds do you currently haveo 1 Approximately how long have you had your woundso 2 weeks How have you been treating your wound(s) until nowo dry drying Has your wound(s) ever healed and then re-openedo No Have you had any lab work done in the past montho No Have you had any tests for circulation on your legso Yes Have you had other problems associated with your woundso Infection, Swelling Eyes Complaints and Symptoms: Positive for: Glasses / Contacts Medical History: Positive for: Cataracts Negative for: Glaucoma; Optic Neuritis Integumentary (Skin) Complaints and Symptoms: Positive for: Wounds Medical History: Negative for: History of Burn; History of pressure wounds Constitutional Symptoms (General Health) Complaints and Symptoms: No Complaints or Symptoms Ear/Nose/Mouth/Throat Complaints and Symptoms: No Complaints or Symptoms Medical History: Negative for: Chronic sinus problems/congestion; Middle ear problems Spencer [name], Spencer [name] ([id number]) Hematologic/Lymphatic Complaints and Symptoms: No Complaints or Symptoms Medical History: Positive for: Anemia Respiratory Complaints and Symptoms: No Complaints or Symptoms Medical History: Negative for: Aspiration; Asthma; Chronic Obstructive Pulmonary Disease (COPD); Pneumothorax; Sleep Apnea; Tuberculosis Cardiovascular Medical History: Positive for: Hypertension; Peripheral Arterial Disease Past Medical History Notes: High cholsterol Gastrointestinal Complaints and Symptoms: No Complaints or Symptoms Medical History: Negative for: Cirrhosis ; Colitis; Crohnos; Hepatitis A; Hepatitis B; Hepatitis C Endocrine Complaints and Symptoms: No  Complaints or Symptoms Medical History: Positive for: Type II Diabetes Time with diabetes: 10 years Treated with: Insulin Blood sugar tested every day: No Genitourinary Complaints and Symptoms: No Complaints or Symptoms Medical History: Negative for: End Stage Renal Disease Spencer [name], Spencer [name] ([id number]) Immunological Complaints and Symptoms: No Complaints or Symptoms Medical History: Negative for: Lupus Erythematosus; Raynaudos; Scleroderma Musculoskeletal Complaints and Symptoms: No Complaints or Symptoms Medical History: Positive for: Osteoarthritis  Neurologic Complaints and Symptoms: No Complaints or Symptoms Medical History: Negative for: Dementia; Neuropathy; Quadriplegia; Paraplegia; Seizure Disorder Oncologic Complaints and Symptoms: No Complaints or Symptoms Medical History: Negative for: Received Chemotherapy; Received Radiation HBO Extended History Items Eyes: Cataracts Family and Social History Cancer: No; Diabetes: Yes - Mother; Heart Disease: No; Hereditary Spherocytosis: No; Hypertension: Yes - Mother; Kidney Disease: No; Lung Disease: No; Seizures: No; Stroke: No; Thyroid Problems: No; Tuberculosis: No; Former smoker; Marital Status - Widowed; Alcohol Use: Rarely; Drug Use: No History; Caffeine Use: Moderate; Financial Concerns: No; Food, Clothing or Shelter Needs: No; Support System Lacking: No; Transportation Concerns: No; Advanced Directives: No; Living Will: No Electronic Signature(s) Signed: 02/18/2015 5:19:57 PM By: Regan Lemming BSN, RN Signed: 02/18/2015 5:51:40 PM By: Linton Ham MD Entered By: Regan Lemming on 02/18/2015 15:05:11 Spencer [name] ([id number]) -------------------------------------------------------------------------------- SuperBill Details Patient Name: Spencer [name] Date of Service: 02/18/2015 Medical Record Number: [id number] Patient Account Number: [id number] Date of Birth/Sex: 06-Jan-1918 (82 y.o. 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, right lower extremity E11.51 Type 2 diabetes mellitus with diabetic peripheral angiopathy without gangrene Facility Procedures CPT4 Code: [id number] Description: 99213 - WOUND CARE VISIT-LEV 3 EST PT Modifier: Quantity: 1 Physician Procedures CPT4: Description Modifier Quantity Code [id number] - WC PHYS LEVEL 4 - NEW PT 25 1 ICD-10 Description Diagnosis L97.513 Non-pressure chronic ulcer of other part of right foot with necrosis of muscle CPT4: [id number] - WC PHYS SUBQ TISS 20 SQ CM 1 ICD-10 Description Diagnosis 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

## 2015-02-22 LAB — WOUND CULTURE: CULTURE: NO GROWTH

## 2015-02-25 ENCOUNTER — Encounter: Payer: BLUE CROSS/BLUE SHIELD | Admitting: Internal Medicine

## 2015-02-25 DIAGNOSIS — E11621 Type 2 diabetes mellitus with foot ulcer: Secondary | ICD-10-CM | POA: Diagnosis not present

## 2015-02-26 NOTE — Progress Notes (Signed)
DORAIN, CICOTTE ([id number]) Visit Report for 02/25/2015 Chief Complaint Document Details Patient Name: Spencer [name] Date of Service: 02/25/2015 3:30 PM Medical Record Number: [id number] Patient Account Number: [id number] Date of Birth/Sex: 1917-10-11 (82 y.o. Male) Treating RN: Montey Hora Primary Care Physician: [name] Other Clinician: Referring Physician: [name] Treating Physician/Extender: Tito Dine in Treatment: 1 Information Obtained from: Patient Chief Complaint Patient is here for review of a nonhealing area on a right transmetatarsal amputation site. He is a type II diabetic on oral agents Electronic Signature(s) Signed: 02/25/2015 5:04:49 PM By: Linton Ham MD Entered By: Linton Ham on 02/25/2015 16:31:41 Spencer [name] ([id number]) -------------------------------------------------------------------------------- Debridement Details Patient Name: Spencer [name] Date of Service: 02/25/2015 3:30 PM Medical Record Number: [id number] Patient Account Number: [id number] Date of Birth/Sex: 25-Sep-1917 (82 y.o. Male) Treating RN: Montey Hora Primary Care Physician: [name] Other Clinician: Referring Physician: [name] Treating Physician/Extender: Tito Dine in Treatment: 1 Debridement Performed for Wound #2 Right,Medial,Plantar Amputation Site - Transmetatarsal Assessment: Performed By: Physician Ricard Dillon, MD Debridement: Debridement Pre-procedure Yes Verification/Time Out Taken: Start Time: 15:48 Pain Control: Lidocaine 4% Topical Solution Level: Skin/Subcutaneous Tissue Total Area Debrided (L x 9.7 (cm) x 3 (cm) = 29.1 (cm) W): Tissue and other Non-Viable, Fat, Fibrin/Slough, Subcutaneous material debrided: Bleeding: Moderate Hemostasis Achieved: Pressure End Time: 15:53 Procedural Pain: 0 Post Procedural Pain: 0 Response to Treatment: Procedure was tolerated well Post  Debridement Measurements of Total Wound Length: (cm) 9.7 Width: (cm) 3 Depth: (cm) 2.3 Volume: (cm) 52.567 Post Procedure Diagnosis Same as Pre-procedure Electronic Signature(s) Signed: 02/25/2015 5:04:49 PM By: Linton Ham MD Signed: 02/25/2015 5:18:01 PM By: Montey Hora Entered By: Linton Ham on 02/25/2015 16:31:26 Spencer [name] ([id number]) -------------------------------------------------------------------------------- HPI Details Patient Name: Spencer [name] Date of Service: 02/25/2015 3:30 PM Medical Record Number: [id number] Patient Account Number: [id number] Date of Birth/Sex: [birth year] (82 y.o. Male) Treating RN: Montey Hora Primary Care Physician: [name] Other Clinician: Referring Physician: [name] Treating Physician/Extender: Ricard Dillon Weeks in Treatment: 1 History of Present Illness HPI Description: VENOUS STUDIES: ARTERIAL STUDIES: 01/27/15 arteriogram right common femoral profunda femoris is widely patent. SFA is patent. 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 Anterior tibial demonstrates multiple high- grade stenosis with several short segmental occlusions CULTURES: 01/23/15; methicillin sensitive staph aureus Radiology; 01/21/15; soft tissue swelling of the right foot with scattered foci of soft tissue gas. CT SCAN MRI; ANTIBIOTICS PRESCRIBED; 02/18/15 this is a 82 year old man who lives independently at the [facility]. He was relatively well up until the day before a complex hospitalization from 1/11 through 02/02/15. He apparently had a fall the day before and was noted to have right foot cellulitis in a diabetic foot ulcer with gangrene in the first toe. He was admitted to hospital requiring IV antibiotics for methicillin sensitive staph aureus. He underwent a surgical debridement of necrotic  tissue and again on 1/20. He underwent an arteriogram and a PCI to the right anterior tibial artery. He completed his IV antibiotics on February 3. He was taken to the OR on 1/20 for a right first ray amputation however during this operation it was elected to do a right transmetatarsal amputation. He apparently saw his podiatric surgeon Dr. Vickki Muff last week and was referred here for consideration of HBO. In the interim the medial aspect of his surgical wound is completely dehisced. The patient is  in the nursing home part of the [facility] and is non-weightbearing. Patient is a type 2 diabetic on oral agents 02/25/15; the patient went back to see Dr. Vickki Muff who feels that he needs aggressive wound care. If this fails he'll need an amputation. Culture I did last week was negative. He is apparently following with Dr. Ola Spurr of infectious disease. I didn't know this. As far as I'm aware he is finished antibiotics. He is coming off Medicare A at the skilled facility where he lives him of this allows for consideration of hyperbaric oxygen. Electronic Signature(s) Signed: 02/26/2015 5:30:43 AM By: Linton Ham MD Previous Signature: 02/25/2015 5:04:49 PM Version By: Linton Ham MD Entered By: Linton Ham on 02/26/2015 05:29:30 Spencer [name] ([id number]) -------------------------------------------------------------------------------- Physical Exam Details Patient Name: Spencer [name] Date of Service: 02/25/2015 3:30 PM Medical Record Number: [id number] Patient Account Number: [id number] Date of Birth/Sex: 1917-09-18 (82 y.o. Male) Treating RN: Montey Hora Primary Care Physician: [name] Other Clinician: Referring Physician: [name] Treating Physician/Extender: Ricard Dillon Weeks in Treatment: 1 Respiratory Respiratory effort is easy and symmetric bilaterally. Rate is normal at rest and on room air.. Bilateral breath sounds are clear and equal in all  lobes with no wheezes, rales or rhonchi.. Cardiovascular Grade 4/6 systolic ejection murmur radiating into the carotids there is no S3 JVP is not elevated.. The area is warm. I believe there is a dorsalis pedis pulse. No edema.. Notes Wound exam; the medial aspect of his right transmetatarsal amputation site is fully open now. Surprisingly the underlying tissue which is exposed right to the muscle actually doesn't look too unhealthy. The tissue looks well perfused. There is no overt infection. There is no underlying hematoma. Electronic Signature(s) Signed: 02/25/2015 5:04:49 PM By: Linton Ham MD Entered By: Linton Ham on 02/25/2015 16:35:01 Spencer [name] ([id number]) -------------------------------------------------------------------------------- Physician Orders Details Patient Name: Spencer [name] Date of Service: 02/25/2015 3:30 PM Medical Record Number: [id number] Patient Account Number: [id number] Date of Birth/Sex: 1917-03-31 (82 y.o. Male) Treating RN: Montey Hora Primary Care Physician: [name] Other Clinician: Referring Physician: [name] Treating Physician/Extender: Tito Dine in Treatment: 1 Verbal / Phone Orders: Yes Clinician: Montey Hora Read Back and Verified: Yes Diagnosis Coding Wound Cleansing Wound #1 Right Amputation Site - Transmetatarsal o Clean wound with Normal Saline. Wound #2 Right,Medial,Plantar Amputation Site - Transmetatarsal o Clean wound with Normal Saline. Anesthetic Wound #1 Right Amputation Site - Transmetatarsal o Topical Lidocaine 4% cream applied to wound bed prior to debridement Wound #2 Right,Medial,Plantar Amputation Site - Transmetatarsal o Topical Lidocaine 4% cream applied to wound bed prior to debridement Primary Wound Dressing Wound #1 Right Amputation Site - Transmetatarsal o Aquacel Ag Wound #2 Right,Medial,Plantar Amputation Site - Transmetatarsal o Aquacel Ag Secondary  Dressing Wound #1 Right Amputation Site - Transmetatarsal o Gauze, ABD and Kerlix/Conform Wound #2 Right,Medial,Plantar Amputation Site - Transmetatarsal o Gauze, ABD and Kerlix/Conform Dressing Change Frequency Wound #1 Right Amputation Site - Transmetatarsal o Change dressing every day. Wound #2 Right,Medial,Plantar Amputation Site - Transmetatarsal o Change dressing every day. Follow-up Appointments Spencer [name], Spencer [name] ([id number]) Wound #1 Right Amputation Site - Transmetatarsal o Return Appointment in 1 week. Wound #2 Right,Medial,Plantar Amputation Site - Transmetatarsal o Return Appointment in 1 week. Off-Loading Wound #1 Right Amputation Site - Transmetatarsal o Heel suspension boot to: - [employer]AGE Boots Wound #2 Right,Medial,Plantar Amputation Site - Transmetatarsal o Heel suspension boot to: - Tax inspector Additional Orders / Instructions Wound #1 Right Amputation Site - Transmetatarsal o  Other: - Patient is still non weight bearing on right leg Wound #2 Right,Medial,Plantar Amputation Site - Transmetatarsal o Other: - Patient is still non weight bearing on right leg Consults o Cardiology - HBO clearance Notes obtain podiatry and ID notes please [facility] RN) Electronic Signature(s) Signed: 02/25/2015 5:04:49 PM By: Linton Ham MD Signed: 02/25/2015 5:18:01 PM By: Montey Hora Entered By: Montey Hora on 02/25/2015 15:57:51 Spencer [name] ([id number]) -------------------------------------------------------------------------------- Problem List Details Patient Name: Spencer [name] Date of Service: 02/25/2015 3:30 PM Medical Record Number: [id number] Patient Account Number: [id number] Date of Birth/Sex: 1917/06/30 (82 y.o. 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 E11.621 Type 2 diabetes mellitus with foot ulcer 02/18/2015 Yes T87.53 Necrosis of amputation stump, right lower extremity 02/18/2015 Yes E11.51 Type 2 diabetes mellitus with diabetic peripheral 02/18/2015 Yes angiopathy without gangrene Inactive Problems Resolved Problems Electronic Signature(s) Signed: 02/25/2015 5:04:49 PM By: Linton Ham MD Entered By: Linton Ham on 02/25/2015 16:30:57 Spencer [name] ([id number]) -------------------------------------------------------------------------------- Progress Note Details Patient Name: Spencer [name] Date of Service: 02/25/2015 3:30 PM Medical Record Number: [id number] Patient Account Number: [id number] Date of Birth/Sex: 1917-02-06 (82 y.o. Male) Treating RN: Montey Hora Primary Care Physician: [name] Other Clinician: Referring Physician: [name] Treating Physician/Extender: Tito Dine in Treatment: 1 Subjective Chief Complaint Information obtained from Patient Patient is here for review of a nonhealing area on a right transmetatarsal amputation site. He is a type II diabetic on oral agents History of Present Illness (HPI) VENOUS STUDIES: ARTERIAL STUDIES: 01/27/15 arteriogram right common femoral profunda femoris is widely patent. SFA is patent. 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 Anterior tibial demonstrates multiple high- grade stenosis with several short segmental occlusions CULTURES: 01/23/15; methicillin sensitive staph aureus Radiology; 01/21/15; soft tissue swelling of the right foot with scattered foci of soft tissue gas. CT SCAN MRI; ANTIBIOTICS PRESCRIBED; 02/18/15 this is a 82 year old man who  lives independently at the [facility]. He was relatively well up until the day before a complex hospitalization from 1/11 through 02/02/15. He apparently had a fall the day before and was noted to have right foot cellulitis in a diabetic foot ulcer with gangrene in the first toe. He was admitted to hospital requiring IV antibiotics for methicillin sensitive staph aureus. He underwent a surgical debridement of necrotic tissue and again on 1/20. He underwent an arteriogram and a PCI to the right anterior tibial artery. He completed his IV antibiotics on February 3. He was taken to the OR on 1/20 for a right first ray amputation however during this operation it was elected to do a right transmetatarsal amputation. He apparently saw his podiatric surgeon Dr. Vickki Muff last week and was referred here for consideration of HBO. In the interim the medial aspect of his surgical wound is completely dehisced. The patient is in the nursing home part of the [facility] and is non-weightbearing. 02/25/15; the patient went back to see Dr. Vickki Muff who feels that he needs aggressive wound care. If this fails he'll need an amputation. Culture I did last week was negative. He is apparently following with Dr. Ola Spurr of infectious disease. I didn't know  this. As far as I'm aware he is finished antibiotics. He is coming off Medicare A at the skilled facility where he lives him of this allows for consideration of hyperbaric oxygen. Spencer [name], Spencer [name] ([id number]) Objective Constitutional Vitals Time Taken: 3:16 PM, Height: 66 in, Weight: 160 lbs, BMI: 25.8, Temperature: 98.1 F, Pulse: 63 bpm, Respiratory Rate: 16 breaths/min, Blood Pressure: 144/41 mmHg. Respiratory Respiratory effort is easy and symmetric bilaterally. Rate is normal at rest and on room air.. Bilateral breath sounds are clear and equal in all lobes with no wheezes, rales or rhonchi.. Cardiovascular Grade 4/6 systolic ejection murmur  radiating into the carotids there is no S3 JVP is not elevated.. The area is warm. I believe there is a dorsalis pedis pulse. No edema.. General Notes: Wound exam; the medial aspect of his right transmetatarsal amputation site is fully open now. Surprisingly the underlying tissue which is exposed right to the muscle actually doesn't look too unhealthy. The tissue looks well perfused. There is no overt infection. There is no underlying hematoma. Integumentary (Hair, Skin) Wound #1 status is Open. Original cause of wound was Surgical Injury. The wound is located on the Right Amputation Site - Transmetatarsal. The wound measures 14cm length x 1cm width x 0.1cm depth; 10.996cm^2 area and 1.1cm^3 volume. The wound is limited to skin breakdown. There is no tunneling or undermining noted. There is a small amount of serosanguineous drainage noted. The wound margin is distinct with the outline attached to the wound base. There is no granulation within the wound bed. There is a large (67-100%) amount of necrotic tissue within the wound bed including Eschar and Adherent Slough. The periwound skin appearance exhibited: Maceration, Moist. The periwound skin appearance did not exhibit: Callus, Crepitus, Excoriation, Fluctuance, Friable, Induration, Localized Edema, Rash, Scarring, Dry/Scaly, Atrophie Blanche, Cyanosis, Ecchymosis, Hemosiderin Staining, Mottled, Pallor, Rubor, Erythema. Periwound temperature was noted as No Abnormality. Wound #2 status is Open. Original cause of wound was Surgical Injury. The wound is located on the Right,Medial,Plantar Amputation Site - Transmetatarsal. The wound measures 9.7cm length x 3cm width x 2.3cm depth; 22.855cm^2 area and 52.567cm^3 volume. The wound is limited to skin breakdown. There is no tunneling or undermining noted. There is a medium amount of serosanguineous drainage noted. The wound margin is distinct with the outline attached to the wound base. There is  small (1-33%) red granulation within the wound bed. There is a large (67-100%) amount of necrotic tissue within the wound bed including Eschar and Adherent Slough. The periwound skin appearance exhibited: Maceration, Moist. The periwound skin appearance did not exhibit: Callus, Crepitus, Excoriation, Fluctuance, Friable, Induration, Localized Edema, Rash, Scarring, Dry/Scaly, Atrophie Blanche, Cyanosis, Ecchymosis, Hemosiderin Staining, Mottled, Pallor, Rubor, Erythema. Periwound temperature was noted as No Abnormality. Assessment Spencer [name], Spencer [name] ([id number]) Active Problems ICD-10 [phone] - Non-pressure chronic ulcer of other part of right foot with necrosis of muscle E11.621 - Type 2 diabetes mellitus with foot ulcer T87.53 - Necrosis of amputation stump, right lower extremity E11.51 - Type 2 diabetes mellitus with diabetic peripheral angiopathy without gangrene Procedures Wound #2 Wound #2 is a Diabetic Wound/Ulcer of the Lower Extremity located on the Right,Medial,Plantar Amputation Site - Transmetatarsal . There was a Skin/Subcutaneous Tissue Debridement [id number]) debridement with total area of 29.1 sq cm performed by Ricard Dillon, MD. to remove Non-Viable tissue/material including Fat, Fibrin/Slough, and Subcutaneous after achieving pain control using Lidocaine 4% Topical Solution. A time out was conducted prior to the start of the procedure. A Moderate amount of bleeding  was controlled with Pressure. The procedure was tolerated well with a pain level of 0 throughout and a pain level of 0 following the procedure. Post Debridement Measurements: 9.7cm length x 3cm width x 2.3cm depth; 52.567cm^3 volume. Post procedure Diagnosis Wound #2: Same as Pre-Procedure Plan Wound Cleansing: Wound #1 Right Amputation Site - Transmetatarsal: Clean wound with Normal Saline. Wound #2 Right,Medial,Plantar Amputation Site - Transmetatarsal: Clean wound with Normal  Saline. Anesthetic: Wound #1 Right Amputation Site - Transmetatarsal: Topical Lidocaine 4% cream applied to wound bed prior to debridement Wound #2 Right,Medial,Plantar Amputation Site - Transmetatarsal: Topical Lidocaine 4% cream applied to wound bed prior to debridement Primary Wound Dressing: Wound #1 Right Amputation Site - Transmetatarsal: Aquacel Ag Wound #2 Right,Medial,Plantar Amputation Site - Transmetatarsal: Aquacel Ag Secondary Dressing: Wound #1 Right Amputation Site - TransmetatarsalISHAAN, Spencer [name] (Spencer [name]) Gauze, ABD and Kerlix/Conform Wound #2 Right,Medial,Plantar Amputation Site - Transmetatarsal: Gauze, ABD and Kerlix/Conform Dressing Change Frequency: Wound #1 Right Amputation Site - Transmetatarsal: Change dressing every day. Wound #2 Right,Medial,Plantar Amputation Site - Transmetatarsal: Change dressing every day. Follow-up Appointments: Wound #1 Right Amputation Site - Transmetatarsal: Return Appointment in 1 week. Wound #2 Right,Medial,Plantar Amputation Site - Transmetatarsal: Return Appointment in 1 week. Off-Loading: Wound #1 Right Amputation Site - Transmetatarsal: Heel suspension boot to: - [employer] Boots Wound #2 Right,Medial,Plantar Amputation Site - Transmetatarsal: Heel suspension boot to: - Tax inspector Additional Orders / Instructions: Wound #1 Right Amputation Site - Transmetatarsal: Other: - Patient is still non weight bearing on right leg Wound #2 Right,Medial,Plantar Amputation Site - Transmetatarsal: Other: - Patient is still non weight bearing on right leg Consults ordered were: Cardiology - HBO clearance General Notes: obtain podiatry and ID notes please [facility] RN) #1 I think we should consider pushing forward with regards to hyperbaric oxygen for a failed flap in the setting of a necrotic soft tissue infection that so far was successfully treated with antibiotics [methicillin sensitive staph aureus]. This is in spite of his advanced  age. #2 I have asked for cardiology clearance For hyperbaric oxygen given his moderately advanced aortic stenosis with a valve area of 1.1 cm. #3 I have continued with the Aquacel Ag given the wound drainage which they're changing daily at the nursing facility at [facility] #4I think the primary treatment here probably should be a wound VAC with collagen under foam . One of our nurses raised a good point about the ability of the nursing facility to manage her back like this. We will look into this. ultimately I think an Apligraf would have to be considered at some point Number 2I have asked for cardiology clearance Electronic Signature(s) Spencer [name], Spencer [name] (Spencer [name]) Signed: 02/25/2015 5:04:49 PM By: Linton Ham MD Entered By: Linton Ham on 02/25/2015 16:40:37 Spencer [name] (Spencer [name]) -------------------------------------------------------------------------------- [place] Details Patient Name: Spencer [name] Date of Service: 02/25/2015 Medical Record Number: Spencer [name] Patient Account Number: [id number] Date of Birth/Sex: [birth year] (82 y.o. Male) Treating RN: Montey Hora Primary Care Physician: [name] Other Clinician: Referring Physician: [name] Treating Physician/Extender: Ricard Dillon Weeks in Treatment: 1 Diagnosis Coding ICD-10 Codes Code Description [phone] Non-pressure chronic ulcer of other part of right foot with necrosis of muscle E11.621 Type 2 diabetes mellitus with foot ulcer T87.53 Necrosis of amputation stump, right lower extremity E11.51 Type 2 diabetes mellitus with diabetic peripheral angiopathy without gangrene Facility Procedures CPT4: Description Modifier Quantity Code [id number] - DEB SUBQ TISSUE 20 SQ CM/< 1 ICD-10 Description Diagnosis L97.513 Non-pressure chronic ulcer of other part of  right foot with necrosis of muscle CPT4: [id number] - DEB SUBQ TISS EA ADDL 20CM 1 ICD-10 Description Diagnosis L97.513  Non-pressure chronic ulcer of other part of right foot with necrosis of muscle Physician Procedures CPT4: Description Modifier Quantity Code [id number] - WC PHYS SUBQ TISS 20 SQ CM 1 ICD-10 Description Diagnosis L97.513 Non-pressure chronic ulcer of other part of right foot with necrosis of muscle CPT4: [id number] - WC PHYS SUBQ TISS EA ADDL 20 CM 1 ICD-10 Description Diagnosis L97.513 Spencer [name], Spencer [name] (Spencer [name]) Electronic Signature(s) Signed: 02/25/2015 5:04:49 PM By: Linton Ham MD Entered By: Linton Ham on 02/25/2015 16:41:10

## 2015-02-26 NOTE — Progress Notes (Signed)
Spencer, [name] ([id number]) Visit Report for 02/25/2015 Arrival Information Details Patient Name: Spencer [name], Spencer [name] Date of Service: 02/25/2015 3:30 PM Medical Record Number: [id number] Patient Account Number: [id number] Date of Birth/Sex: 23-Sep-1917 (82 y.o. Male) Treating RN: Montey Hora Primary Care Physician: [name] Other Clinician: Referring Physician: [name] Treating Physician/Extender: Tito Dine in Treatment: 1 Visit Information History Since Last Visit Added or deleted any medications: No Patient Arrived: Wheel Chair Any new allergies or adverse reactions: No Arrival Time: 15:13 Had a fall or experienced change in No Accompanied By: son activities of daily living that may affect Transfer Assistance: Manual risk of falls: Patient Identification Verified: Yes Signs or symptoms of abuse/neglect since last No Secondary Verification Process Yes visito Completed: Hospitalized since last visit: No Patient Requires Transmission- No Pain Present Now: No Based Precautions: Patient Has Alerts: Yes Patient Alerts: Patient on Blood Thinner Eliquis Electronic Signature(s) Signed: 02/25/2015 5:18:01 PM By: Montey Hora Entered By: Montey Hora on 02/25/2015 15:16:14 Spencer [name] ([id number]) -------------------------------------------------------------------------------- Encounter Discharge Information Details Patient Name: Spencer [name] Date of Service: 02/25/2015 3:30 PM Medical Record Number: [id number] Patient Account Number: [id number] Date of Birth/Sex: 06-10-1917 (82 y.o. Male) Treating RN: Montey Hora Primary Care Physician: [name] Other Clinician: Referring Physician: [name] Treating Physician/Extender: Tito Dine in Treatment: 1 Encounter Discharge Information Items Discharge Pain Level: 0 Discharge Condition: Stable Ambulatory Status: Wheelchair Discharge Destination: Nursing  Home Transportation: Private Auto Accompanied By: son Schedule Follow-up Appointment: Yes Medication Reconciliation completed and provided to Patient/Care No Corby Vandenberghe: Provided on Clinical Summary of Care: 02/25/2015 Form Type Recipient Paper Patient [facility] Electronic Signature(s) Signed: 02/25/2015 4:46:16 PM By: Montey Hora Previous Signature: 02/25/2015 4:14:22 PM Version By: Ruthine Dose Entered By: Montey Hora on 02/25/2015 16:46:16 Spencer [name] ([id number]) -------------------------------------------------------------------------------- Lower Extremity Assessment Details Patient Name: Spencer [name] Date of Service: 02/25/2015 3:30 PM Medical Record Number: [id number] Patient Account Number: [id number] Date of Birth/Sex: 01/09/1918 (82 y.o. Male) Treating RN: Montey Hora Primary Care Physician: [name] Other Clinician: Referring Physician: [name] Treating Physician/Extender: Ricard Dillon Weeks in Treatment: 1 Vascular Assessment Pulses: Posterior Tibial Palpable: [Left:No] Doppler: [Left:Monophasic] Dorsalis Pedis Palpable: [Left:Yes] Doppler: [Left:Monophasic] Extremity colors, hair growth, and conditions: Extremity Color: [Left:Normal] Hair Growth on Extremity: [Left:No] Temperature of Extremity: [Left:Warm] Capillary Refill: [Left:< 3 seconds] Electronic Signature(s) Signed: 02/25/2015 5:18:01 PM By: Montey Hora Entered By: Montey Hora on 02/25/2015 15:34:17 Spencer [name] ([id number]) -------------------------------------------------------------------------------- Multi Wound Chart Details Patient Name: Spencer [name] Date of Service: 02/25/2015 3:30 PM Medical Record Number: [id number] Patient Account Number: [id number] Date of Birth/Sex: [birth year] (82 y.o. Male) Treating RN: Montey Hora Primary Care Physician: [name] Other Clinician: Referring Physician: [name] Treating Physician/Extender: Ricard Dillon Weeks in Treatment: 1 Vital Signs Height(in): 66 Pulse(bpm): 63 Weight(lbs): 160 Blood Pressure 144/41 (mmHg): Body Mass Index(BMI): 26 Temperature(F): 98.1 Respiratory Rate 16 (breaths/min): Photos: [1:No Photos] [2:No Photos] [N/A:N/A] Wound Location: [1:Right Amputation Site - Right Amputation Site - Transmetatarsal] [2:Transmetatarsal - Plantar, Medial] [N/A:N/A] Wounding Event: [1:Surgical Injury] [2:Surgical Injury] [N/A:N/A] 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] [2:the Lower Extremity] Secondary Etiology: [1:Compromised Skin Graft/Flap] [2:Compromised Skin Graft/Flap] [N/A:N/A] Comorbid History: [1:Cataracts, Anemia, Hypertension, Peripheral Hypertension, Peripheral Arterial Disease, Type II Arterial Disease, Type II Diabetes, Osteoarthritis Diabetes, Osteoarthritis] [2:Cataracts, Anemia,] [N/A:N/A] Date Acquired: [1:02/05/2015] [2:02/06/2015] [N/A:N/A] Weeks of Treatment: [1:1] [2:1] [N/A:N/A] Wound Status: [1:Open] [2:Open] [N/A:N/A] Measurements L x W x D 14x1x0.1 [2:9.7x3x2.3] [N/A:N/A] (cm) Area (cm) : [1:10.996] [2:22.855] [  N/A:N/A] Volume (cm) : [1:1.1] [2:52.567] [N/A:N/A] % Reduction in Area: [1:51.70%] [2:-61.70%] [N/A:N/A] % Reduction in Volume: 51.70% [2:-1759.50%] [N/A:N/A] Classification: [1:Grade 1] [2:Grade 1] [N/A:N/A] Exudate Amount: [1:Small] [2:Medium] [N/A:N/A] Exudate Type: [1:Serosanguineous] [2:Serosanguineous] [N/A:N/A] Exudate Color: [1:red, brown] [2:red, brown] [N/A:N/A] Wound Margin: [1:Distinct, outline attached Distinct, outline attached N/A] Granulation Amount: [1:None Present (0%)] [2:Small (1-33%)] [N/A:N/A] Granulation Quality: [1:N/A] [2:Red] [N/A:N/A] Necrotic Amount: [1:Large (67-100%)] [2:Large (67-100%)] [N/A:N/A] Necrotic Tissue: Eschar, Adherent Slough Eschar, Adherent Slough N/A Exposed Structures: Fascia: No Fascia: No N/A Fat: No Fat: No Tendon:  No Tendon: No Muscle: No Muscle: No Joint: No Joint: No Bone: No Bone: No Limited to Skin Limited to Skin Breakdown Breakdown Epithelialization: Small (1-33%) None N/A Periwound Skin Texture: Edema: No Edema: No N/A Excoriation: No Excoriation: No Induration: No Induration: No Callus: No Callus: No Crepitus: No Crepitus: No Fluctuance: No Fluctuance: No Friable: No Friable: No Rash: No Rash: No Scarring: No Scarring: No Periwound Skin Maceration: Yes Maceration: Yes N/A Moisture: Moist: Yes Moist: Yes Dry/Scaly: No Dry/Scaly: No Periwound Skin Color: Atrophie Blanche: No Atrophie Blanche: No N/A Cyanosis: No Cyanosis: No Ecchymosis: No Ecchymosis: No Erythema: No Erythema: No Hemosiderin Staining: No Hemosiderin Staining: No Mottled: No Mottled: No Pallor: No Pallor: No Rubor: No Rubor: No Temperature: No Abnormality No Abnormality N/A Tenderness on No No N/A Palpation: Wound Preparation: Ulcer Cleansing: Ulcer Cleansing: N/A Rinsed/Irrigated with Rinsed/Irrigated with Saline Saline Topical Anesthetic Topical Anesthetic Applied: Other: Lidocaine Applied: Other: lidocaine 4% 4% Treatment Notes Electronic Signature(s) Signed: 02/25/2015 5:18:01 PM By: Montey Hora Entered By: Montey Hora on 02/25/2015 15:45:12 Spencer [name] ([id number]) -------------------------------------------------------------------------------- Multi-Disciplinary Care Plan Details Patient Name: Spencer [name] Date of Service: 02/25/2015 3:30 PM Medical Record Number: [id number] Patient Account Number: [id number] Date of Birth/Sex: [birth year] (82 y.o. Male) Treating RN: Montey Hora Primary Care Physician: [name] Other Clinician: Referring Physician: [name] Treating Physician/Extender: Tito Dine in Treatment: 1 Active Inactive HBO Nursing Diagnoses: Anxiety related to feelings of confinement associated with the hyperbaric oxygen  chamber Anxiety related to knowledge deficit of hyperbaric oxygen therapy and treatment procedures Discomfort related to temperature and humidity changes inside hyperbaric chamber Potential for barotraumas to ears, sinuses, teeth, and lungs or cerebral gas embolism related to changes in atmospheric pressure inside hyperbaric oxygen chamber Potential for oxygen toxicity seizures related to delivery of 100% oxygen at an increased atmospheric pressure Potential for pulmonary oxygen toxicity related to delivery of 100% oxygen at an increased atmospheric pressure Goals: Barotrauma will be prevented during HBO2 Date Initiated: 02/18/2015 Goal Status: Active Patient and/or family will be able to state/discuss factors appropriate to the management of their disease process during treatment Date Initiated: 02/18/2015 Goal Status: Active Patient will tolerate the hyperbaric oxygen therapy treatment Date Initiated: 02/18/2015 Goal Status: Active Patient will tolerate the internal climate of the chamber Date Initiated: 02/18/2015 Goal Status: Active Patient/caregiver will verbalize understanding of HBO goals, rationale, procedures and potential hazards Date Initiated: 02/18/2015 Goal Status: Active Signs and symptoms of pulmonary oxygen toxicity will be recognized and promptly addressed Date Initiated: 02/18/2015 Goal Status: Active Signs and symptoms of seizure will be recognized and promptly addressed ; seizing patients will suffer no harm Date Initiated: 02/18/2015 DRESHON, SCHEY ([id number]) Goal Status: Active Interventions: Administer decongestants, per physician orders, prior to HBO2 Administer the correct therapeutic gas delivery based on the patients needs and limitations, per physician order Assess and provide for patientos comfort related to the hyperbaric environment and equalization of middle ear Assess for  signs and symptoms related to adverse events, including but not limited to  confinement anxiety, pneumothorax, oxygen toxicity and baurotrauma Assess patient for any history of confinement anxiety Assess patient's knowledge and expectations regarding hyperbaric medicine and provide education related to the hyperbaric environment, goals of treatment and prevention of adverse events Implement protocols to decrease risk of pneumothorax in high risk patients Notes: Abuse / Safety / Falls / Self Care Management Nursing Diagnoses: Impaired home maintenance Impaired physical mobility Knowledge deficit related to abuse or neglect Knowledge deficit related to: safety; personal, health (wound), emergency Potential for falls Potential for injury related to abuse or neglect Self care deficit: actual or potential Goals: Patient will remain injury free Date Initiated: 02/18/2015 Goal Status: Active Patient/caregiver will verbalize understanding of skin care regimen Date Initiated: 02/18/2015 Goal Status: Active Patient/caregiver will verbalize/demonstrate measure taken to improve self care Date Initiated: 02/18/2015 Goal Status: Active Patient/caregiver will verbalize/demonstrate measures taken to improve the patient's personal safety Date Initiated: 02/18/2015 Goal Status: Active Patient/caregiver will verbalize/demonstrate measures taken to prevent injury and/or falls Date Initiated: 02/18/2015 Goal Status: Active Patient/caregiver will verbalize/demonstrate understanding of what to do in case of emergency GLADE, SAN ([id number]) Date Initiated: 02/18/2015 Goal Status: Active Interventions: Assess fall risk on admission and as needed Assess: immobility, friction, shearing, incontinence upon admission and as needed Assess impairment of mobility on admission and as needed per policy Assess self care needs on admission and as needed Patient referred to community resources (specify in notes) Provide education on basic hygiene Provide education on fall prevention Provide  education on personal and home safety Provide education on safe transfers Treatment Activities: Education provided on Basic Hygiene : 02/18/2015 Notes: Orientation to the Wound Care Program Nursing Diagnoses: Knowledge deficit related to the wound healing center program Goals: Patient/caregiver will verbalize understanding of the [place] [facility] Date Initiated: 02/18/2015 Goal Status: Active Interventions: Provide education on orientation to the wound center Notes: Osteomyelitis Nursing Diagnoses: Infection: osteomyelitis Knowledge deficit related to disease process and management Potential for infection: osteomyelitis Goals: Diagnostic evaluation for osteomyelitis completed as ordered Date Initiated: 02/18/2015 Goal Status: Active Patient/caregiver will verbalize understanding of disease process and disease management OCIE, KUPER ([id number]) Date Initiated: 02/18/2015 Goal Status: Active Patient's osteomyelitis will resolve Date Initiated: 02/18/2015 Goal Status: Active Signs and symptoms for osteomyelitis will be recognized and promptly addressed Date Initiated: 02/18/2015 Goal Status: Active Interventions: Assess for signs and symptoms of osteomyelitis resolution every visit Provide education on osteomyelitis Treatment Activities: Consult for HBO : 02/25/2015 Notes: Peripheral Neuropathy Nursing Diagnoses: Knowledge deficit related to disease process and management of peripheral neurovascular dysfunction Potential alteration in peripheral tissue perfusion (select prior to confirmation of diagnosis) Goals: Patient/caregiver will verbalize understanding of disease process and disease management Date Initiated: 02/18/2015 Goal Status: Active Interventions: Assess signs and symptoms of neuropathy upon admission and as needed Provide education on Management of Neuropathy and Related Ulcers Provide education on Management of Neuropathy upon discharge from the [place] for HBO Notes: Wound/Skin Impairment Nursing Diagnoses: Impaired tissue integrity Knowledge deficit related to smoking impact on wound healing Knowledge deficit related to ulceration/compromised skin integrity Goals: Patient/caregiver will verbalize understanding of skin care regimen EULAS, PLASENCIA ([id number]) Date Initiated: 02/18/2015 Goal Status: Active Ulcer/skin breakdown will have a volume reduction of 30% by week 4 Date Initiated: 02/18/2015 Goal Status: Active Ulcer/skin breakdown will have a volume reduction of 50% by week 8 Date Initiated: 02/18/2015 Goal Status: Active Ulcer/skin breakdown will have a volume reduction  of 80% by week 12 Date Initiated: 02/18/2015 Goal Status: Active Ulcer/skin breakdown will heal within 14 weeks Date Initiated: 02/18/2015 Goal Status: Active Interventions: Assess patient/caregiver ability to obtain necessary supplies Assess patient/caregiver ability to perform ulcer/skin care regimen upon admission and as needed Assess ulceration(s) every visit Provide education on ulcer and skin care Screen for HBO Treatment Activities: Skin care regimen initiated : 02/25/2015 Topical wound management initiated : 02/25/2015 Notes: Electronic Signature(s) Signed: 02/25/2015 5:18:01 PM By: Montey Hora Entered By: Montey Hora on 02/25/2015 15:43:38 Spencer [name] ([id number]) -------------------------------------------------------------------------------- Patient/Caregiver Education Details Patient Name: Spencer [name] Date of Service: 02/25/2015 3:30 PM Medical Record Number: [id number] Patient Account Number: [id number] Date of Birth/Gender: 03-29-1917 (82 y.o. Male) Treating RN: Montey Hora Primary Care Physician: [name] Other Clinician: Referring Physician: [name] Treating Physician/Extender: Tito Dine in Treatment: 1 Education Assessment Education Provided To: Patient and Caregiver Education  Topics Provided Wound/Skin Impairment: Handouts: Other: wound care and wound healing Methods: Demonstration, Explain/Verbal Responses: State content correctly Electronic Signature(s) Signed: 02/25/2015 4:46:32 PM By: Montey Hora Entered By: Montey Hora on 02/25/2015 16:46:32 Spencer [name] ([id number]) -------------------------------------------------------------------------------- Wound Assessment Details Patient Name: Spencer [name] Date of Service: 02/25/2015 3:30 PM Medical Record Number: [id number] Patient Account Number: [id number] Date of Birth/Sex: 08-22-1917 (82 y.o. Male) Treating RN: Montey Hora Primary Care Physician: [name] Other Clinician: Referring Physician: [name] Treating Physician/Extender: Ricard Dillon Weeks in Treatment: 1 Wound Status Wound Number: 1 Primary Diabetic Wound/Ulcer of the Lower Etiology: Extremity Wound Location: Right Amputation Site - Transmetatarsal Secondary Compromised Skin Graft/Flap Etiology: Wounding Event: Surgical Injury Wound Open Date Acquired: 02/05/2015 Status: Weeks Of Treatment: 1 Comorbid Cataracts, Anemia, Hypertension, Clustered Wound: No History: Peripheral Arterial Disease, Type II Diabetes, Osteoarthritis Photos Photo Uploaded By: Montey Hora on 02/25/2015 17:14:18 Wound Measurements Length: (cm) 14 Width: (cm) 1 Depth: (cm) 0.1 Area: (cm) 10.996 Volume: (cm) 1.1 % Reduction in Area: 51.7% % Reduction in Volume: 51.7% Epithelialization: Small (1-33%) Tunneling: No Undermining: No Wound Description Classification: Grade 1 Wound Margin: Distinct, outline attached Exudate Amount: Small Exudate Type: Serosanguineous Exudate Color: red, brown Foul Odor After Cleansing: No Wound Bed Granulation Amount: None Present (0%) Exposed Structure Necrotic Amount: Large (67-100%) Fascia Exposed: No Picchi, Eljay ([id number]) Necrotic Quality: Eschar, Adherent Slough Fat Layer  Exposed: No Tendon Exposed: No Muscle Exposed: No Joint Exposed: No Bone Exposed: No Limited to Skin Breakdown Periwound Skin Texture Texture Color No Abnormalities Noted: No No Abnormalities Noted: No Callus: No Atrophie Blanche: No Crepitus: No Cyanosis: No Excoriation: No Ecchymosis: No Fluctuance: No Erythema: No Friable: No Hemosiderin Staining: No Induration: No Mottled: No Localized Edema: No Pallor: No Rash: No Rubor: No Scarring: No Temperature / Pain Moisture Temperature: No Abnormality No Abnormalities Noted: No Dry / Scaly: No Maceration: Yes Moist: Yes Wound Preparation Ulcer Cleansing: Rinsed/Irrigated with Saline Topical Anesthetic Applied: Other: Lidocaine 4%, Treatment Notes Wound #1 (Right Amputation Site - Transmetatarsal) 1. Cleansed with: Clean wound with Normal Saline 2. Anesthetic Topical Lidocaine 4% cream to wound bed prior to debridement 4. Dressing Applied: Aquacel Ag 5. Secondary Dressing Applied Guaze, ABD and kerlix/Conform 7. Secured with Recruitment consultant) Signed: 02/25/2015 5:18:01 PM By: Montey Hora Entered By: Montey Hora on 02/25/2015 15:29:23 LIMUEL, CLINKSCALE ([id number] [name] ([id number]) -------------------------------------------------------------------------------- Wound Assessment Details Patient Name: Spencer [name] Date of Service: 02/25/2015 3:30 PM Medical Record Number: [id number] Patient Account Number: [id number] Date of Birth/Sex: 11/22/1917 (82 y.o. Male) Treating RN: Montey Hora Primary Care Physician: Silvio Pate  Richard Other Clinician: Referring Physician: [name] Treating Physician/Extender: Tito Dine in Treatment: 1 Wound Status Wound Number: 2 Primary Diabetic Wound/Ulcer of the Lower Etiology: Extremity Wound Location: Right Amputation Site - Transmetatarsal - Plantar, Secondary Compromised Skin Graft/Flap Medial Etiology: Wounding Event:  Surgical Injury Wound Open Status: Date Acquired: 02/06/2015 Comorbid Cataracts, Anemia, Hypertension, Weeks Of Treatment: 1 History: Peripheral Arterial Disease, Type II Clustered Wound: No Diabetes, Osteoarthritis Photos Photo Uploaded By: Montey Hora on 02/25/2015 17:13:50 Wound Measurements Length: (cm) 9.7 Width: (cm) 3 Depth: (cm) 2.3 Area: (cm) 22.855 Volume: (cm) 52.567 % Reduction in Area: -61.7% % Reduction in Volume: -1759.5% Epithelialization: None Tunneling: No Undermining: No Wound Description Classification: Grade 1 Wound Margin: Distinct, outline attached Exudate Amount: Medium Exudate Type: Serosanguineous Exudate Color: red, brown Foul Odor After Cleansing: No Wound Bed Granulation Amount: Small (1-33%) Exposed Structure Granulation Quality: Red Fascia Exposed: No Larmon, Abdulrahman ([id number]) Necrotic Amount: Large (67-100%) Fat Layer Exposed: No Necrotic Quality: Eschar, Adherent Slough Tendon Exposed: No Muscle Exposed: No Joint Exposed: No Bone Exposed: No Limited to Skin Breakdown Periwound Skin Texture Texture Color No Abnormalities Noted: No No Abnormalities Noted: No Callus: No Atrophie Blanche: No Crepitus: No Cyanosis: No Excoriation: No Ecchymosis: No Fluctuance: No Erythema: No Friable: No Hemosiderin Staining: No Induration: No Mottled: No Localized Edema: No Pallor: No Rash: No Rubor: No Scarring: No Temperature / Pain Moisture Temperature: No Abnormality No Abnormalities Noted: No Dry / Scaly: No Maceration: Yes Moist: Yes Wound Preparation Ulcer Cleansing: Rinsed/Irrigated with Saline Topical Anesthetic Applied: Other: lidocaine 4%, Treatment Notes Wound #2 (Right, Medial, Plantar Amputation Site - Transmetatarsal) 1. Cleansed with: Clean wound with Normal Saline 2. Anesthetic Topical Lidocaine 4% cream to wound bed prior to debridement 4. Dressing Applied: Aquacel Ag 5. Secondary Dressing  Applied Guaze, ABD and kerlix/Conform 7. Secured with Recruitment consultant) Signed: 02/25/2015 5:18:01 PM By: Montey Hora Entered By: Montey Hora on 02/25/2015 15:29:12 JOSYIAH, LEO ([id number]) ISAI, PETRICCA ([id number]) -------------------------------------------------------------------------------- [place] Details Patient Name: Spencer [name] Date of Service: 02/25/2015 3:30 PM Medical Record Number: [id number] Patient Account Number: [id number] Date of Birth/Sex: 1917/02/20 (82 y.o. Male) Treating RN: Montey Hora Primary Care Physician: [name] Other Clinician: Referring Physician: [name] Treating Physician/Extender: Tito Dine in Treatment: 1 Vital Signs Time Taken: 15:16 Temperature (F): 98.1 Height (in): 66 Pulse (bpm): 63 Weight (lbs): 160 Respiratory Rate (breaths/min): 16 Body Mass Index (BMI): 25.8 Blood Pressure (mmHg): 144/41 Reference Range: 80 - 120 mg / dl Electronic Signature(s) Signed: 02/25/2015 5:18:01 PM By: Montey Hora Entered By: Montey Hora on 02/25/2015 15:18:16

## 2015-03-02 DIAGNOSIS — I481 Persistent atrial fibrillation: Secondary | ICD-10-CM | POA: Diagnosis not present

## 2015-03-02 DIAGNOSIS — I358 Other nonrheumatic aortic valve disorders: Secondary | ICD-10-CM

## 2015-03-02 DIAGNOSIS — I739 Peripheral vascular disease, unspecified: Secondary | ICD-10-CM | POA: Diagnosis not present

## 2015-03-02 DIAGNOSIS — E11621 Type 2 diabetes mellitus with foot ulcer: Secondary | ICD-10-CM | POA: Diagnosis not present

## 2015-03-02 DIAGNOSIS — Z89431 Acquired absence of right foot: Secondary | ICD-10-CM

## 2015-03-02 DIAGNOSIS — E1142 Type 2 diabetes mellitus with diabetic polyneuropathy: Secondary | ICD-10-CM | POA: Diagnosis not present

## 2015-03-04 ENCOUNTER — Encounter: Payer: BLUE CROSS/BLUE SHIELD | Admitting: Internal Medicine

## 2015-03-04 DIAGNOSIS — E11621 Type 2 diabetes mellitus with foot ulcer: Secondary | ICD-10-CM | POA: Diagnosis not present

## 2015-03-05 NOTE — Progress Notes (Signed)
Spencer [name], Spencer [name] ([id number]) Visit Report for 03/04/2015 Chief Complaint Document Details Patient Name: Spencer [name], Spencer [name] Date of Service: 03/04/2015 2:45 PM Medical Record Number: [id number] Patient Account Number: [id number] Date of Birth/Sex: 1917-07-10 (82 y.o. Male) Treating RN: Montey Hora Primary Care Physician: [name] Other Clinician: Referring Physician: [name] Treating Physician/Extender: Tito Dine in Treatment: 2 Information Obtained from: Patient Chief Complaint Patient is here for review of a nonhealing area on a right transmetatarsal amputation site. He is a type II diabetic on oral agents Electronic Signature(s) Signed: 03/04/2015 5:19:28 PM By: Linton Ham MD Entered By: Linton Ham on 03/04/2015 16:24:44 Spencer [name] ([id number]) -------------------------------------------------------------------------------- Debridement Details Patient Name: Spencer [name] Date of Service: 03/04/2015 2:45 PM Medical Record Number: [id number] Patient Account Number: [id number] Date of Birth/Sex: 07/12/1917 (82 y.o. Male) Treating RN: Montey Hora Primary Care Physician: [name] Other Clinician: Referring Physician: [name] Treating Physician/Extender: Tito Dine in Treatment: 2 Debridement Performed for Wound #2 Right,Medial,Plantar Amputation Site - Transmetatarsal Assessment: Performed By: Physician Ricard Dillon, MD Debridement: Debridement Pre-procedure Yes Verification/Time Out Taken: Start Time: 14:52 Pain Control: Lidocaine 4% Topical Solution Level: Skin/Subcutaneous Tissue Total Area Debrided (L x 9.6 (cm) x 3 (cm) = 28.8 (cm) W): Tissue and other Viable, Non-Viable, Eschar, Fibrin/Slough, Subcutaneous material debrided: Instrument: Blade, Curette, Forceps Bleeding: Moderate Hemostasis Achieved: Pressure End Time: 14:57 Procedural Pain: 0 Post Procedural Pain: 0 Response to  Treatment: Procedure was tolerated well Post Debridement Measurements of Total Wound Length: (cm) 9.6 Width: (cm) 3 Depth: (cm) 2.3 Volume: (cm) 52.025 Post Procedure Diagnosis Same as Pre-procedure Electronic Signature(s) Signed: 03/04/2015 5:19:28 PM By: Linton Ham MD Signed: 03/04/2015 5:46:57 PM By: Montey Hora Entered By: Linton Ham on 03/04/2015 16:24:23 Spencer [name] ([id number]) -------------------------------------------------------------------------------- HPI Details Patient Name: Spencer [name] Date of Service: 03/04/2015 2:45 PM Medical Record Number: [id number] Patient Account Number: [id number] Date of Birth/Sex: 05-02-1917 (82 y.o. Male) Treating RN: Montey Hora Primary Care Physician: [name] Other Clinician: Referring Physician: [name] Treating Physician/Extender: Ricard Dillon Weeks in Treatment: 2 History of Present Illness HPI Description: VENOUS STUDIES: ARTERIAL STUDIES: 01/27/15 arteriogram right common femoral profunda femoris is widely patent. SFA is patent. 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 Anterior tibial demonstrates multiple high- grade stenosis with several short segmental occlusions CULTURES: 01/23/15; methicillin sensitive staph aureus Radiology; 01/21/15; soft tissue swelling of the right foot with scattered foci of soft tissue gas. CT SCAN MRI; ANTIBIOTICS PRESCRIBED; 02/18/15 this is a 82 year old man who lives independently at the [facility]. He was relatively well up until the day before a complex hospitalization from 1/11 through 02/02/15. He apparently had a fall the day before and was noted to have right foot cellulitis in a diabetic foot ulcer with gangrene in the first toe. He was admitted to hospital requiring IV antibiotics for methicillin sensitive staph aureus. He  underwent a surgical debridement of necrotic tissue and again on 1/20. He underwent an arteriogram and a PCI to the right anterior tibial artery. He completed his IV antibiotics on February 3. He was taken to the OR on 1/20 for a right first ray amputation however during this operation it was elected to do a right transmetatarsal amputation. He apparently saw his podiatric surgeon Dr. Vickki Muff last week and was referred here for consideration of HBO. In the interim the medial aspect of his surgical wound is  completely dehisced. The patient is in the nursing home part of the [facility] and is non-weightbearing. Patient is a type 2 diabetic on oral agents 02/25/15; the patient went back to see Dr. Vickki Muff who feels that he needs aggressive wound care. If this fails he'll need an amputation. Culture I did last week was negative. He is apparently following with Dr. Ola Spurr of infectious disease. I didn't know this. As far as I'm aware he is finished antibiotics. He is coming off Medicare A at the skilled facility where he lives him of this allows for consideration of hyperbaric oxygen. 03/04/15; the patient went his cardiologist to define him as "100% risk" requiring hemodynamic monitoring etc. etc. etc. we'll not be able to undergo hyperbaric oxygen therefore. He arrives today with thick adherent slough over a large area of this wound and a history of copious amounts of drainage. He underwent an aggressive surgical debridement to remove densely adherent service slough and nonviable tissue. The base of this wound is still not to bone but it is not far from it. He has probing areas into the soft tissue is foot but no overt infection. He also saw Dr. Ola Spurr of infectious disease Electronic Signature(s) Signed: 03/04/2015 5:19:28 PM By: Linton Ham MD Entered By: Linton Ham on 03/04/2015 16:39:44 Spencer [name], Spencer [name] ([id number]) Spencer [name], Spencer [name]  ([id number]) -------------------------------------------------------------------------------- Physical Exam Details Patient Name: Spencer [name] Date of Service: 03/04/2015 2:45 PM Medical Record Number: [id number] Patient Account Number: [id number] Date of Birth/Sex: 1917-01-23 (82 y.o. Male) Treating RN: Montey Hora Primary Care Physician: [name] Other Clinician: Referring Physician: [name] Treating Physician/Extender: Ricard Dillon Weeks in Treatment: 2 Constitutional Sitting or standing Blood Pressure is within target range for patient.. Supine Blood Pressure is within target range for patient.. Pulse regular and within target range for patient.Marland Kitchen Respirations regular, non-labored and within target range.. Temperature is normal and within the target range for the patient.. The patient does not look unwell. Notes Wound exam; unfortunately the base of this deep wound does not look nearly as healthy as last week. There is no overt soft tissue infection surrounding the wound. Extensive debridement done. There does not appear to be an arterial insufficiency issue here. Electronic Signature(s) Signed: 03/04/2015 5:19:28 PM By: Linton Ham MD Entered By: Linton Ham on 03/04/2015 16:41:33 Spencer [name] ([id number]) -------------------------------------------------------------------------------- Physician Orders Details Patient Name: Spencer [name] Date of Service: 03/04/2015 2:45 PM Medical Record Number: [id number] Patient Account Number: [id number] Date of Birth/Sex: 17-Jan-1917 (82 y.o. Male) Treating RN: Montey Hora Primary Care Physician: [name] Other Clinician: Referring Physician: [name] Treating Physician/Extender: Tito Dine in Treatment: 2 Verbal / Phone Orders: Yes Clinician: Montey Hora Read Back and Verified: Yes Diagnosis Coding Wound Cleansing Wound #1 Right Amputation Site - Transmetatarsal o  Clean wound with Normal Saline. Wound #2 Right,Medial,Plantar Amputation Site - Transmetatarsal o Clean wound with Normal Saline. Anesthetic Wound #1 Right Amputation Site - Transmetatarsal o Topical Lidocaine 4% cream applied to wound bed prior to debridement Wound #2 Right,Medial,Plantar Amputation Site - Transmetatarsal o Topical Lidocaine 4% cream applied to wound bed prior to debridement Primary Wound Dressing Wound #1 Right Amputation Site - Transmetatarsal o Aquacel Ag Wound #2 Right,Medial,Plantar Amputation Site - Transmetatarsal o Santyl Ointment o Aquacel Secondary Dressing Wound #1 Right Amputation Site - Transmetatarsal o Gauze, ABD and Kerlix/Conform Wound #2 Right,Medial,Plantar Amputation Site - Transmetatarsal o Gauze, ABD and Kerlix/Conform o XtraSorb Dressing Change Frequency Wound #1 Right Amputation Site - Transmetatarsal o Change  dressing every day. Wound #2 Right,Medial,Plantar Amputation Site - Transmetatarsal o Change dressing every day. Spencer [name], Spencer [name] ([id number]) Follow-up Appointments Wound #1 Right Amputation Site - Transmetatarsal o Return Appointment in 1 week. Wound #2 Right,Medial,Plantar Amputation Site - Transmetatarsal o Return Appointment in 1 week. Off-Loading Wound #1 Right Amputation Site - Transmetatarsal o Heel suspension boot to: - [employer]AGE Boots Wound #2 Right,Medial,Plantar Amputation Site - Transmetatarsal o Heel suspension boot to: - Tax inspector Additional Orders / Instructions Wound #1 Right Amputation Site - Transmetatarsal o Other: - Patient is still non weight bearing on right leg Wound #2 Right,Medial,Plantar Amputation Site - Transmetatarsal o Other: - Patient is still non weight bearing on right leg Negative Pressure Wound Therapy Wound #2 Right,Medial,Plantar Amputation Site - Transmetatarsal o Wound VAC settings at 125/130 mmHg continuous pressure. Use BLACK/GREEN foam to wound cavity.  Use WHITE foam to fill any tunnel/s and/or undermining. Change VAC dressing 3 X WEEK. Change canister as indicated when full. Nurse may titrate settings and frequency of dressing changes as clinically indicated. - continue using santyl under NPWT foam, initiate NPWT once available - [facility] RN will get in touch with [employer] for teaching SNF RNs about applying NPWT dressing o Home Health Nurse may d/c VAC for s/s of increased infection, significant wound regression, or uncontrolled drainage. [place] at [phone]. Medications-please add to medication list. Wound #2 Right,Medial,Plantar Amputation Site - Transmetatarsal o Santyl Enzymatic Ointment Electronic Signature(s) Signed: 03/04/2015 5:19:28 PM By: Linton Ham MD Signed: 03/04/2015 5:46:57 PM By: Montey Hora Entered By: Montey Hora on 03/04/2015 15:00:44 Spencer [name] ([id number]) -------------------------------------------------------------------------------- Problem List Details Patient Name: Spencer [name] Date of Service: 03/04/2015 2:45 PM Medical Record Number: [id number] Patient Account Number: [id number] Date of Birth/Sex: 07/01/1917 (82 y.o. 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 E11.621 Type 2 diabetes mellitus with foot ulcer 02/18/2015 Yes T87.53 Necrosis of amputation stump, right lower extremity 02/18/2015 Yes E11.51 Type 2 diabetes mellitus with diabetic peripheral 02/18/2015 Yes angiopathy without gangrene Inactive Problems Resolved Problems Electronic Signature(s) Signed: 03/04/2015 5:19:28 PM By: Linton Ham MD Entered By: Linton Ham on 03/04/2015  16:24:01 Spencer [name] ([id number]) -------------------------------------------------------------------------------- Progress Note Details Patient Name: Spencer [name] Date of Service: 03/04/2015 2:45 PM Medical Record Number: [id number] Patient Account Number: [id number] Date of Birth/Sex: 1917-04-12 (82 y.o. Male) Treating RN: Montey Hora Primary Care Physician: [name] Other Clinician: Referring Physician: [name] Treating Physician/Extender: Tito Dine in Treatment: 2 Subjective Chief Complaint Information obtained from Patient Patient is here for review of a nonhealing area on a right transmetatarsal amputation site. He is a type II diabetic on oral agents History of Present Illness (HPI) VENOUS STUDIES: ARTERIAL STUDIES: 01/27/15 arteriogram right common femoral profunda femoris is widely patent. SFA is patent. 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 Anterior tibial demonstrates multiple high- grade stenosis with several short segmental occlusions CULTURES: 01/23/15; methicillin sensitive staph aureus Radiology; 01/21/15; soft tissue swelling of the right foot with scattered foci of soft tissue gas. CT SCAN MRI; ANTIBIOTICS PRESCRIBED; 02/18/15 this is a 82 year old man who lives independently at the [facility]. He was relatively well up  until the day before a complex hospitalization from 1/11 through 02/02/15. He apparently had a fall the day before and was noted to have right foot cellulitis in a diabetic foot ulcer with gangrene in the first toe. He was admitted to hospital requiring IV antibiotics for methicillin sensitive staph aureus. He underwent a surgical debridement of necrotic tissue and again on 1/20. He underwent an arteriogram and a PCI to the right anterior tibial artery. He completed his IV antibiotics  on February 3. He was taken to the OR on 1/20 for a right first ray amputation however during this operation it was elected to do a right transmetatarsal amputation. He apparently saw his podiatric surgeon Dr. Vickki Muff last week and was referred here for consideration of HBO. In the interim the medial aspect of his surgical wound is completely dehisced. The patient is in the nursing home part of the [facility] and is non-weightbearing. Patient is a type 2 diabetic on oral agents 02/25/15; the patient went back to see Dr. Vickki Muff who feels that he needs aggressive wound care. If this fails he'll need an amputation. Culture I did last week was negative. He is apparently following with Dr. Ola Spurr of infectious disease. I didn't know this. As far as I'm aware he is finished antibiotics. He is coming off Medicare A at the skilled facility where he lives him of this allows for consideration of hyperbaric oxygen. 03/04/15; the patient went his cardiologist to define him as "100% risk" requiring hemodynamic monitoring etc. etc. etc. we'll not be able to undergo hyperbaric oxygen therefore. He arrives today with thick adherent slough over a large area of this wound and a history of copious amounts of drainage. He underwent an aggressive surgical debridement to remove densely adherent service slough and nonviable tissue. The base of this wound is still not to bone but it is not far from it. He has probing areas into the soft tissue is foot Spencer [name], Spencer [name] ([id number]) but no overt infection. He also saw Dr. Ola Spurr of infectious disease Objective Constitutional Sitting or standing Blood Pressure is within target range for patient.. Supine Blood Pressure is within target range for patient.. Pulse regular and within target range for patient.Marland Kitchen Respirations regular, non-labored and within target range.. Temperature is normal and within the target range for the patient.. The patient does not look  unwell. Vitals Time Taken: 2:23 PM, Height: 66 in, Weight: 160 lbs, BMI: 25.8, Temperature: 98.3 F, Pulse: 62 bpm, Respiratory Rate: 16 breaths/min, Blood Pressure: 156/50 mmHg. General Notes: Wound exam; unfortunately the base of this deep wound does not look nearly as healthy as last week. There is no overt soft tissue infection surrounding the wound. Extensive debridement done. There does not appear to be an arterial insufficiency issue here. Integumentary (Hair, Skin) Wound #1 status is Open. Original cause of wound was Surgical Injury. The wound is located on the Right Amputation Site - Transmetatarsal. The wound measures 14.3cm length x 1.6cm width x 0.1cm depth; 17.97cm^2 area and 1.797cm^3 volume. The wound is limited to skin breakdown. There is no tunneling or undermining noted. There is a small amount of serosanguineous drainage noted. The wound margin is distinct with the outline attached to the wound base. There is small (1-33%) red, pink granulation within the wound bed. There is a large (67-100%) amount of necrotic tissue within the wound bed including Eschar and Adherent Slough. The periwound skin appearance exhibited: Maceration, Moist. The periwound skin appearance did not exhibit: Callus, Crepitus, Excoriation,  Fluctuance, Friable, Induration, Localized Edema, Rash, Scarring, Dry/Scaly, Atrophie Blanche, Cyanosis, Ecchymosis, Hemosiderin Staining, Mottled, Pallor, Rubor, Erythema. Periwound temperature was noted as No Abnormality. Wound #2 status is Open. Original cause of wound was Surgical Injury. The wound is located on the Right,Medial,Plantar Amputation Site - Transmetatarsal. The wound measures 9.6cm length x 3cm width x 2.1cm depth; 22.619cm^2 area and 47.501cm^3 volume. The wound is limited to skin breakdown. There is no tunneling or undermining noted. There is a medium amount of serosanguineous drainage noted. The wound margin is distinct with the outline attached to  the wound base. There is small (1-33%) red granulation within the wound bed. There is a large (67-100%) amount of necrotic tissue within the wound bed including Eschar and Adherent Slough. The periwound skin appearance exhibited: Maceration, Moist. The periwound skin appearance did not exhibit: Callus, Crepitus, Excoriation, Fluctuance, Friable, Induration, Localized Edema, Rash, Scarring, Dry/Scaly, Atrophie Blanche, Cyanosis, Ecchymosis, Hemosiderin Staining, Mottled, Pallor, Rubor, Erythema. Periwound temperature was noted as No Abnormality. 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 There was a Skin/Subcutaneous Tissue Debridement [id number]) debridement with total area of 28.8 sq cm performed by Ricard Dillon, MD. with the following instrument(s): Blade, Curette, and Forceps to remove Viable and Non-Viable tissue/material including Fibrin/Slough, Eschar, and Subcutaneous after achieving pain control using Lidocaine 4% Topical Solution. A time out was conducted prior to the start of the procedure. A Moderate amount of bleeding was controlled with Pressure. The procedure was tolerated well with a pain level of 0 throughout and a pain level of 0 following the procedure. Post Debridement Measurements: 9.6cm length x 3cm width x 2.3cm depth; 52.025cm^3 volume. Post procedure Diagnosis Wound #2: Same as Pre-Procedure Plan Wound Cleansing: Wound #1 Right Amputation Site - Transmetatarsal: Clean wound with Normal Saline. Wound #2  Right,Medial,Plantar Amputation Site - Transmetatarsal: Clean wound with Normal Saline. Anesthetic: Wound #1 Right Amputation Site - Transmetatarsal: Topical Lidocaine 4% cream applied to wound bed prior to debridement Wound #2 Right,Medial,Plantar Amputation Site - Transmetatarsal: Topical Lidocaine 4% cream applied to wound bed prior to debridement Primary Wound Dressing: Spencer [name], Spencer [name] ([id number]) Wound #1 Right Amputation Site - Transmetatarsal: Aquacel Ag Wound #2 Right,Medial,Plantar Amputation Site - Transmetatarsal: Santyl Ointment Aquacel Secondary Dressing: Wound #1 Right Amputation Site - Transmetatarsal: Gauze, ABD and Kerlix/Conform Wound #2 Right,Medial,Plantar Amputation Site - Transmetatarsal: Gauze, ABD and Kerlix/Conform XtraSorb Dressing Change Frequency: Wound #1 Right Amputation Site - Transmetatarsal: Change dressing every day. Wound #2 Right,Medial,Plantar Amputation Site - Transmetatarsal: Change dressing every day. Follow-up Appointments: Wound #1 Right Amputation Site - Transmetatarsal: Return Appointment in 1 week. Wound #2 Right,Medial,Plantar Amputation Site - Transmetatarsal: Return Appointment in 1 week. Off-Loading: Wound #1 Right Amputation Site - Transmetatarsal: Heel suspension boot to: - [employer] Boots Wound #2 Right,Medial,Plantar Amputation Site - Transmetatarsal: Heel suspension boot to: - Tax inspector Additional Orders / Instructions: Wound #1 Right Amputation Site - Transmetatarsal: Other: - Patient is still non weight bearing on right leg Wound #2 Right,Medial,Plantar Amputation Site - Transmetatarsal: Other: - Patient is still non weight bearing on right leg Negative Pressure Wound Therapy: Wound #2 Right,Medial,Plantar Amputation  Site - Transmetatarsal: Wound VAC settings at 125/130 mmHg continuous pressure. Use BLACK/GREEN foam to wound cavity. Use WHITE foam to fill any tunnel/s and/or undermining. Change VAC dressing 3 X WEEK.  Change canister as indicated when full. Nurse may titrate settings and frequency of dressing changes as clinically indicated. - continue using santyl under NPWT foam, initiate NPWT once available - [facility] RN will get in touch with [employer] for teaching SNF RNs about applying NPWT dressing Home Health Nurse may d/c VAC for s/s of increased infection, significant wound regression, or uncontrolled drainage. [place] at [phone]. Medications-please add to medication list.: Wound #2 Right,Medial,Plantar Amputation Site - Transmetatarsal: Santyl Enzymatic Ointment Spencer [name], Spencer [name] ([id number]) #1 we put Santyl under calcium alginate, ABVD Kerlix and Coban #2 ordered Santyl under VAC foam. Begin a wound VAC. Electronic Signature(s) Signed: 03/04/2015 5:19:28 PM By: Linton Ham MD Entered By: Linton Ham on 03/04/2015 16:42:24 Spencer [name] ([id number]) -------------------------------------------------------------------------------- [place] Details Patient Name: Spencer [name] Date of Service: 03/04/2015 Medical Record Number: [id number] Patient Account Number: [id number] Date of Birth/Sex: [birth year] (82 y.o. Male) Treating RN: Montey Hora Primary Care Physician: [name] Other Clinician: Referring Physician: [name] Treating Physician/Extender: Tito Dine in Treatment: 2 Diagnosis Coding ICD-10 Codes Code Description [id number] Non-pressure chronic ulcer of other part of right foot with necrosis of muscle E11.621 Type 2 diabetes mellitus with foot ulcer T87.53 Necrosis of amputation stump, right lower extremity E11.51 Type 2 diabetes mellitus with diabetic peripheral angiopathy without gangrene Facility Procedures CPT4: Description Modifier Quantity Code [id number] - DEB SUBQ TISSUE 20 SQ CM/< 1 ICD-10 Description Diagnosis L97.513 Non-pressure chronic ulcer of other part of right foot with necrosis of muscle CPT4: [id number]  [id number] - DEB SUBQ TISS EA ADDL 20CM 1 ICD-10 Description Diagnosis L97.513 Non-pressure chronic ulcer of other part of right foot with necrosis of muscle Physician Procedures CPT4: Description Modifier Quantity Code [id number] - WC PHYS SUBQ TISS 20 SQ CM 1 ICD-10 Description Diagnosis L97.513 Non-pressure chronic ulcer of other part of right foot with necrosis of muscle CPT4: [id number] - WC PHYS SUBQ TISS EA ADDL 20 CM 1 ICD-10 Description Diagnosis L97.513 Spencer [name], Spencer [name] ([id number]) Electronic Signature(s) Signed: 03/04/2015 5:19:28 PM By: Linton Ham MD Entered By: Linton Ham on 03/04/2015 16:42:54

## 2015-03-05 NOTE — Progress Notes (Signed)
ZIHAO, MCCOLE ([id number]) Visit Report for 03/04/2015 Arrival Information Details Patient Name: Spencer [name], Spencer [name] Date of Service: 03/04/2015 2:45 PM Medical Record Number: [id number] Patient Account Number: [id number] Date of Birth/Sex: [birth year] (82 y.o. Male) Treating RN: Montey Hora Primary Care Physician: [name] Other Clinician: Referring Physician: [name] Treating Physician/Extender: Tito Dine in Treatment: 2 Visit Information History Since Last Visit Added or deleted any medications: No Patient Arrived: Wheel Chair Any new allergies or adverse reactions: No Arrival Time: 14:21 Had a fall or experienced change in No Accompanied By: son activities of daily living that may affect Transfer Assistance: Manual risk of falls: Patient Identification Verified: Yes Signs or symptoms of abuse/neglect since last No Secondary Verification Process Yes visito Completed: Hospitalized since last visit: No Patient Requires Transmission- No Pain Present Now: No Based Precautions: Patient Has Alerts: Yes Patient Alerts: Patient on Blood Thinner Eliquis Electronic Signature(s) Signed: 03/04/2015 5:46:57 PM By: Montey Hora Entered By: Montey Hora on 03/04/2015 14:21:45 Spencer [name] ([id number]) -------------------------------------------------------------------------------- Encounter Discharge Information Details Patient Name: Spencer [name] Date of Service: 03/04/2015 2:45 PM Medical Record Number: [id number] Patient Account Number: [id number] Date of Birth/Sex: [birth year] (82 y.o. Male) Treating RN: Montey Hora Primary Care Physician: [name] Other Clinician: Referring Physician: [name] Treating Physician/Extender: Tito Dine in Treatment: 2 Encounter Discharge Information Items Discharge Pain Level: 0 Discharge Condition: Stable Ambulatory Status: Wheelchair Discharge Destination: Nursing  Home Transportation: Private Auto Accompanied By: son Schedule Follow-up Appointment: Yes Medication Reconciliation completed and provided to Patient/Care No Atiana Levier: Provided on Clinical Summary of Care: 03/04/2015 Form Type Recipient Paper Patient [facility] Electronic Signature(s) Signed: 03/04/2015 4:29:11 PM By: Montey Hora Previous Signature: 03/04/2015 3:18:47 PM Version By: Ruthine Dose Entered By: Montey Hora on 03/04/2015 16:29:10 Spencer [name] ([id number]) -------------------------------------------------------------------------------- Lower Extremity Assessment Details Patient Name: Spencer [name] Date of Service: 03/04/2015 2:45 PM Medical Record Number: [id number] Patient Account Number: [id number] Date of Birth/Sex: 14-Nov-1917 (82 y.o. Male) Treating RN: Montey Hora Primary Care Physician: [name] Other Clinician: Referring Physician: [name] Treating Physician/Extender: Ricard Dillon Weeks in Treatment: 2 Vascular Assessment Pulses: Posterior Tibial Dorsalis Pedis Palpable: [Right:Yes] Extremity colors, hair growth, and conditions: Extremity Color: [Right:Normal] Hair Growth on Extremity: [Right:No] Temperature of Extremity: [Right:Warm] Capillary Refill: [Right:< 3 seconds] Electronic Signature(s) Signed: 03/04/2015 5:46:57 PM By: Montey Hora Entered By: Montey Hora on 03/04/2015 14:36:29 Spencer [name] ([id number]) -------------------------------------------------------------------------------- Multi Wound Chart Details Patient Name: Spencer [name] Date of Service: 03/04/2015 2:45 PM Medical Record Number: [id number] Patient Account Number: [id number] Date of Birth/Sex: Feb 27, 1917 (82 y.o. Male) Treating RN: Montey Hora Primary Care Physician: [name] Other Clinician: Referring Physician: [name] Treating Physician/Extender: Ricard Dillon Weeks in Treatment: 2 Vital Signs Height(in):  66 Pulse(bpm): 62 Weight(lbs): 160 Blood Pressure 156/50 (mmHg): Body Mass Index(BMI): 26 Temperature(F): 98.3 Respiratory Rate 16 (breaths/min): Photos: [1:No Photos] [2:No Photos] [N/A:N/A] Wound Location: [1:Right Amputation Site - Right Amputation Site - Transmetatarsal] [2:Transmetatarsal - Plantar, Medial] [N/A:N/A] Wounding Event: [1:Surgical Injury] [2:Surgical Injury] [N/A:N/A] 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] [2:the Lower Extremity] Secondary Etiology: [1:Compromised Skin Graft/Flap] [2:Compromised Skin Graft/Flap] [N/A:N/A] Comorbid History: [1:Cataracts, Anemia, Hypertension, Peripheral Hypertension, Peripheral Arterial Disease, Type II Arterial Disease, Type II Diabetes, Osteoarthritis Diabetes, Osteoarthritis] [2:Cataracts, Anemia,] [N/A:N/A] Date Acquired: [1:02/05/2015] [2:02/06/2015] [N/A:N/A] Weeks of Treatment: [1:2] [2:2] [N/A:N/A] Wound Status: [1:Open] [2:Open] [N/A:N/A] Measurements L x W x D 14.3x1.6x0.1 [2:9.6x3x2.1] [N/A:N/A] (cm) Area (cm) : [1:17.97] [2:22.619] [N/A:N/A] Volume (cm) : [1:1.797] [2:47.501] [  N/A:N/A] % Reduction in Area: [1:21.10%] [2:-60.00%] [N/A:N/A] % Reduction in Volume: 21.10% [2:-1580.30%] [N/A:N/A] Classification: [1:Grade 1] [2:Grade 1] [N/A:N/A] Exudate Amount: [1:Small] [2:Medium] [N/A:N/A] Exudate Type: [1:Serosanguineous] [2:Serosanguineous] [N/A:N/A] Exudate Color: [1:red, brown] [2:red, brown] [N/A:N/A] Wound Margin: [1:Distinct, outline attached Distinct, outline attached N/A] Granulation Amount: [1:Small (1-33%)] [2:Small (1-33%)] [N/A:N/A] Granulation Quality: [1:Red, Pink] [2:Red] [N/A:N/A] Necrotic Amount: [1:Large (67-100%)] [2:Large (67-100%)] [N/A:N/A] Necrotic Tissue: Eschar, Adherent Slough Eschar, Adherent Slough N/A Exposed Structures: Fascia: No Fascia: No N/A Fat: No Fat: No Tendon: No Tendon: No Muscle: No Muscle: No Joint: No Joint:  No Bone: No Bone: No Limited to Skin Limited to Skin Breakdown Breakdown Epithelialization: Small (1-33%) None N/A Periwound Skin Texture: Edema: No Edema: No N/A Excoriation: No Excoriation: No Induration: No Induration: No Callus: No Callus: No Crepitus: No Crepitus: No Fluctuance: No Fluctuance: No Friable: No Friable: No Rash: No Rash: No Scarring: No Scarring: No Periwound Skin Maceration: Yes Maceration: Yes N/A Moisture: Moist: Yes Moist: Yes Dry/Scaly: No Dry/Scaly: No Periwound Skin Color: Atrophie Blanche: No Atrophie Blanche: No N/A Cyanosis: No Cyanosis: No Ecchymosis: No Ecchymosis: No Erythema: No Erythema: No Hemosiderin Staining: No Hemosiderin Staining: No Mottled: No Mottled: No Pallor: No Pallor: No Rubor: No Rubor: No Temperature: No Abnormality No Abnormality N/A Tenderness on No No N/A Palpation: Wound Preparation: Ulcer Cleansing: Ulcer Cleansing: N/A Rinsed/Irrigated with Rinsed/Irrigated with Saline Saline Topical Anesthetic Topical Anesthetic Applied: Other: Lidocaine Applied: Other: lidocaine 4% 4% Treatment Notes Electronic Signature(s) Signed: 03/04/2015 5:46:57 PM By: Montey Hora Entered By: Montey Hora on 03/04/2015 14:37:43 Spencer [name] ([id number]) -------------------------------------------------------------------------------- [place] Details Patient Name: Spencer [name] Date of Service: 03/04/2015 2:45 PM Medical Record Number: [id number] Patient Account Number: [id number] Date of Birth/Sex: Sep 03, 1917 (82 y.o. Male) Treating RN: Montey Hora Primary Care Physician: [name] Other Clinician: Referring Physician: [name] Treating Physician/Extender: Tito Dine in Treatment: 2 Active Inactive HBO Nursing Diagnoses: Anxiety related to feelings of confinement associated with the hyperbaric oxygen chamber Anxiety related to knowledge deficit of hyperbaric  oxygen therapy and treatment procedures Discomfort related to temperature and humidity changes inside hyperbaric chamber Potential for barotraumas to ears, sinuses, teeth, and lungs or cerebral gas embolism related to changes in atmospheric pressure inside hyperbaric oxygen chamber Potential for oxygen toxicity seizures related to delivery of 100% oxygen at an increased atmospheric pressure Potential for pulmonary oxygen toxicity related to delivery of 100% oxygen at an increased atmospheric pressure Goals: Barotrauma will be prevented during HBO2 Date Initiated: 02/18/2015 Goal Status: Active Patient and/or family will be able to state/discuss factors appropriate to the management of their disease process during treatment Date Initiated: 02/18/2015 Goal Status: Active Patient will tolerate the hyperbaric oxygen therapy treatment Date Initiated: 02/18/2015 Goal Status: Active Patient will tolerate the internal climate of the chamber Date Initiated: 02/18/2015 Goal Status: Active Patient/caregiver will verbalize understanding of HBO goals, rationale, procedures and potential hazards Date Initiated: 02/18/2015 Goal Status: Active Signs and symptoms of pulmonary oxygen toxicity will be recognized and promptly addressed Date Initiated: 02/18/2015 Goal Status: Active Signs and symptoms of seizure will be recognized and promptly addressed ; seizing patients will suffer no harm Date Initiated: 02/18/2015 Spencer [name], Spencer [name] ([id number]) Goal Status: Active Interventions: Administer decongestants, per physician orders, prior to HBO2 Administer the correct therapeutic gas delivery based on the patients needs and limitations, per physician order Assess and provide for patientos comfort related to the hyperbaric environment and equalization of middle ear Assess for signs and symptoms related to adverse  events, including but not limited to confinement anxiety, pneumothorax, oxygen toxicity and  baurotrauma Assess patient for any history of confinement anxiety Assess patient's knowledge and expectations regarding hyperbaric medicine and provide education related to the hyperbaric environment, goals of treatment and prevention of adverse events Implement protocols to decrease risk of pneumothorax in high risk patients Notes: Abuse / Safety / Falls / Self Care Management Nursing Diagnoses: Impaired home maintenance Impaired physical mobility Knowledge deficit related to abuse or neglect Knowledge deficit related to: safety; personal, health (wound), emergency Potential for falls Potential for injury related to abuse or neglect Self care deficit: actual or potential Goals: Patient will remain injury free Date Initiated: 02/18/2015 Goal Status: Active Patient/caregiver will verbalize understanding of skin care regimen Date Initiated: 02/18/2015 Goal Status: Active Patient/caregiver will verbalize/demonstrate measure taken to improve self care Date Initiated: 02/18/2015 Goal Status: Active Patient/caregiver will verbalize/demonstrate measures taken to improve the patient's personal safety Date Initiated: 02/18/2015 Goal Status: Active Patient/caregiver will verbalize/demonstrate measures taken to prevent injury and/or falls Date Initiated: 02/18/2015 Goal Status: Active Patient/caregiver will verbalize/demonstrate understanding of what to do in case of emergency Spencer [name], Spencer [name] ([id number]) Date Initiated: 02/18/2015 Goal Status: Active Interventions: Assess fall risk on admission and as needed Assess: immobility, friction, shearing, incontinence upon admission and as needed Assess impairment of mobility on admission and as needed per policy Assess self care needs on admission and as needed Patient referred to community resources (specify in notes) Provide education on basic hygiene Provide education on fall prevention Provide education on personal and home safety Provide  education on safe transfers Treatment Activities: Education provided on Basic Hygiene : 02/18/2015 Notes: Orientation to the Wound Care Program Nursing Diagnoses: Knowledge deficit related to the wound healing center program Goals: Patient/caregiver will verbalize understanding of the [facility] Date Initiated: 02/18/2015 Goal Status: Active Interventions: Provide education on orientation to the wound center Notes: Osteomyelitis Nursing Diagnoses: Infection: osteomyelitis Knowledge deficit related to disease process and management Potential for infection: osteomyelitis Goals: Diagnostic evaluation for osteomyelitis completed as ordered Date Initiated: 02/18/2015 Goal Status: Active Patient/caregiver will verbalize understanding of disease process and disease management Spencer [name], Spencer [name] ([id number]) Date Initiated: 02/18/2015 Goal Status: Active Patient's osteomyelitis will resolve Date Initiated: 02/18/2015 Goal Status: Active Signs and symptoms for osteomyelitis will be recognized and promptly addressed Date Initiated: 02/18/2015 Goal Status: Active Interventions: Assess for signs and symptoms of osteomyelitis resolution every visit Provide education on osteomyelitis Treatment Activities: Consult for HBO : 03/04/2015 Notes: Peripheral Neuropathy Nursing Diagnoses: Knowledge deficit related to disease process and management of peripheral neurovascular dysfunction Potential alteration in peripheral tissue perfusion (select prior to confirmation of diagnosis) Goals: Patient/caregiver will verbalize understanding of disease process and disease management Date Initiated: 02/18/2015 Goal Status: Active Interventions: Assess signs and symptoms of neuropathy upon admission and as needed Provide education on Management of Neuropathy and Related Ulcers Provide education on Management of Neuropathy upon discharge from the [facility] for HBO Notes: Wound/Skin  Impairment Nursing Diagnoses: Impaired tissue integrity Knowledge deficit related to smoking impact on wound healing Knowledge deficit related to ulceration/compromised skin integrity Goals: Patient/caregiver will verbalize understanding of skin care regimen Spencer [name], Spencer [name] ([id number]) Date Initiated: 02/18/2015 Goal Status: Active Ulcer/skin breakdown will have a volume reduction of 30% by week 4 Date Initiated: 02/18/2015 Goal Status: Active Ulcer/skin breakdown will have a volume reduction of 50% by week 8 Date Initiated: 02/18/2015 Goal Status: Active Ulcer/skin breakdown will have a volume reduction of 80% by week 12 Date  Initiated: 02/18/2015 Goal Status: Active Ulcer/skin breakdown will heal within 14 weeks Date Initiated: 02/18/2015 Goal Status: Active Interventions: Assess patient/caregiver ability to obtain necessary supplies Assess patient/caregiver ability to perform ulcer/skin care regimen upon admission and as needed Assess ulceration(s) every visit Provide education on ulcer and skin care Screen for HBO Treatment Activities: Skin care regimen initiated : 03/04/2015 Topical wound management initiated : 03/04/2015 Notes: Electronic Signature(s) Signed: 03/04/2015 5:46:57 PM By: Montey Hora Entered By: Montey Hora on 03/04/2015 14:37:35 Spencer [name] ([id number]) -------------------------------------------------------------------------------- Patient/Caregiver Education Details Patient Name: Spencer [name] Date of Service: 03/04/2015 2:45 PM Medical Record Number: [id number] Patient Account Number: [id number] Date of Birth/Gender: Dec 09, 1917 (82 y.o. Male) Treating RN: Montey Hora Primary Care Physician: [name] Other Clinician: Referring Physician: [name] Treating Physician/Extender: Tito Dine in Treatment: 2 Education Assessment Education Provided To: Patient and Caregiver Education Topics Provided Wound/Skin  Impairment: Handouts: Other: NPWT Methods: Explain/Verbal Responses: State content correctly Electronic Signature(s) Signed: 03/04/2015 4:29:28 PM By: Montey Hora Entered By: Montey Hora on 03/04/2015 16:29:28 Spencer [name] ([id number]) -------------------------------------------------------------------------------- Wound Assessment Details Patient Name: Spencer [name] Date of Service: 03/04/2015 2:45 PM Medical Record Number: [id number] Patient Account Number: [id number] Date of Birth/Sex: 19-Mar-1917 (82 y.o. Male) Treating RN: Montey Hora Primary Care Physician: [name] Other Clinician: Referring Physician: [name] Treating Physician/Extender: Ricard Dillon Weeks in Treatment: 2 Wound Status Wound Number: 1 Primary Diabetic Wound/Ulcer of the Lower Etiology: Extremity Wound Location: Right Amputation Site - Transmetatarsal Secondary Compromised Skin Graft/Flap Etiology: Wounding Event: Surgical Injury Wound Open Date Acquired: 02/05/2015 Status: Weeks Of Treatment: 2 Comorbid Cataracts, Anemia, Hypertension, Clustered Wound: No History: Peripheral Arterial Disease, Type II Diabetes, Osteoarthritis Photos Photo Uploaded By: Montey Hora on 03/04/2015 16:55:36 Wound Measurements Length: (cm) 14.3 Width: (cm) 1.6 Depth: (cm) 0.1 Area: (cm) 17.97 Volume: (cm) 1.797 % Reduction in Area: 21.1% % Reduction in Volume: 21.1% Epithelialization: Small (1-33%) Tunneling: No Undermining: No Wound Description Classification: Grade 1 Wound Margin: Distinct, outline attached Exudate Amount: Small Exudate Type: Serosanguineous Exudate Color: red, brown Foul Odor After Cleansing: No Wound Bed Granulation Amount: Small (1-33%) Exposed Structure Granulation Quality: Red, Pink Fascia Exposed: No Spencer [name], Spencer [name] ([id number]) Necrotic Amount: Large (67-100%) Fat Layer Exposed: No Necrotic Quality: Eschar, Adherent Slough Tendon Exposed:  No Muscle Exposed: No Joint Exposed: No Bone Exposed: No Limited to Skin Breakdown Periwound Skin Texture Texture Color No Abnormalities Noted: No No Abnormalities Noted: No Callus: No Atrophie Blanche: No Crepitus: No Cyanosis: No Excoriation: No Ecchymosis: No Fluctuance: No Erythema: No Friable: No Hemosiderin Staining: No Induration: No Mottled: No Localized Edema: No Pallor: No Rash: No Rubor: No Scarring: No Temperature / Pain Moisture Temperature: No Abnormality No Abnormalities Noted: No Dry / Scaly: No Maceration: Yes Moist: Yes Wound Preparation Ulcer Cleansing: Rinsed/Irrigated with Saline Topical Anesthetic Applied: Other: Lidocaine 4%, Treatment Notes Wound #1 (Right Amputation Site - Transmetatarsal) 1. Cleansed with: Clean wound with Normal Saline 2. Anesthetic Topical Lidocaine 4% cream to wound bed prior to debridement 4. Dressing Applied: Aquacel 5. Secondary Dressing Applied Guaze, ABD and kerlix/Conform 7. Secured with Recruitment consultant) Signed: 03/04/2015 5:46:57 PM By: Montey Hora Entered By: Montey Hora on 03/04/2015 14:35:51 Spencer [name], Spencer [name] ([id number]) Spencer [name], Spencer [name] ([id number]) -------------------------------------------------------------------------------- Wound Assessment Details Patient Name: Spencer [name] Date of Service: 03/04/2015 2:45 PM Medical Record Number: [id number] Patient Account Number: [id number] Date of Birth/Sex: 18-Nov-1917 (82 y.o. Male) Treating RN: Montey Hora Primary Care Physician: [name] Other Clinician: Referring Physician: [name] Treating Physician/Extender:  Spencer [name], Spencer [name] Weeks in Treatment: 2 Wound Status Wound Number: 2 Primary Diabetic Wound/Ulcer of the Lower Etiology: Extremity Wound Location: Right Amputation Site - Transmetatarsal - Plantar, Secondary Compromised Skin Graft/Flap Medial Etiology: Wounding Event: Surgical Injury Wound  Open Status: Date Acquired: 02/06/2015 Comorbid Cataracts, Anemia, Hypertension, Weeks Of Treatment: 2 History: Peripheral Arterial Disease, Type II Clustered Wound: No Diabetes, Osteoarthritis Photos Photo Uploaded By: Montey Hora on 03/04/2015 16:57:49 Wound Measurements Length: (cm) 9.6 Width: (cm) 3 Depth: (cm) 2.1 Area: (cm) 22.619 Volume: (cm) 47.501 % Reduction in Area: -60% % Reduction in Volume: -1580.3% Epithelialization: None Tunneling: No Undermining: No Wound Description Classification: Grade 1 Wound Margin: Distinct, outline attached Exudate Amount: Medium Exudate Type: Serosanguineous Exudate Color: red, brown Foul Odor After Cleansing: No Wound Bed Granulation Amount: Small (1-33%) Exposed Structure Granulation Quality: Red Fascia Exposed: No Rosamond, Acey ([id number]) Necrotic Amount: Large (67-100%) Fat Layer Exposed: No Necrotic Quality: Eschar, Adherent Slough Tendon Exposed: No Muscle Exposed: No Joint Exposed: No Bone Exposed: No Limited to Skin Breakdown Periwound Skin Texture Texture Color No Abnormalities Noted: No No Abnormalities Noted: No Callus: No Atrophie Blanche: No Crepitus: No Cyanosis: No Excoriation: No Ecchymosis: No Fluctuance: No Erythema: No Friable: No Hemosiderin Staining: No Induration: No Mottled: No Localized Edema: No Pallor: No Rash: No Rubor: No Scarring: No Temperature / Pain Moisture Temperature: No Abnormality No Abnormalities Noted: No Dry / Scaly: No Maceration: Yes Moist: Yes Wound Preparation Ulcer Cleansing: Rinsed/Irrigated with Saline Topical Anesthetic Applied: Other: lidocaine 4%, Treatment Notes Wound #2 (Right, Medial, Plantar Amputation Site - Transmetatarsal) 1. Cleansed with: Clean wound with Normal Saline 2. Anesthetic Topical Lidocaine 4% cream to wound bed prior to debridement 4. Dressing Applied: Aquacel Santyl Ointment Other dressing (specify in notes) 5.  Secondary Dressing Applied Guaze, ABD and kerlix/Conform 7. Secured with Tape Notes xtrasorb TARON, WEINGARTZ ([id number]) Electronic Signature(s) Signed: 03/04/2015 5:46:57 PM By: Montey Hora Entered By: Montey Hora on 03/04/2015 14:36:03 Spencer [name] ([id number]) -------------------------------------------------------------------------------- Vitals Details Patient Name: Spencer [name] Date of Service: 03/04/2015 2:45 PM Medical Record Number: [id number] Patient Account Number: [id number] Date of Birth/Sex: 07/01/1917 (82 y.o. Male) Treating RN: Montey Hora Primary Care Physician: [name] Other Clinician: Referring Physician: [name] Treating Physician/Extender: Tito Dine in Treatment: 2 Vital Signs Time Taken: 14:23 Temperature (F): 98.3 Height (in): 66 Pulse (bpm): 62 Weight (lbs): 160 Respiratory Rate (breaths/min): 16 Body Mass Index (BMI): 25.8 Blood Pressure (mmHg): 156/50 Reference Range: 80 - 120 mg / dl Electronic Signature(s) Signed: 03/04/2015 5:46:57 PM By: Montey Hora Entered By: Montey Hora on 03/04/2015 14:25:56

## 2015-03-11 ENCOUNTER — Encounter: Payer: Medicare Other | Attending: Internal Medicine | Admitting: Internal Medicine

## 2015-03-11 DIAGNOSIS — E11621 Type 2 diabetes mellitus with foot ulcer: Secondary | ICD-10-CM | POA: Diagnosis present

## 2015-03-11 DIAGNOSIS — L97513 Non-pressure chronic ulcer of other part of right foot with necrosis of muscle: Secondary | ICD-10-CM | POA: Insufficient documentation

## 2015-03-11 DIAGNOSIS — T8753 Necrosis of amputation stump, right lower extremity: Secondary | ICD-10-CM | POA: Insufficient documentation

## 2015-03-11 DIAGNOSIS — Y835 Amputation of limb(s) as the cause of abnormal reaction of the patient, or of later complication, without mention of misadventure at the time of the procedure: Secondary | ICD-10-CM | POA: Diagnosis not present

## 2015-03-11 DIAGNOSIS — E1151 Type 2 diabetes mellitus with diabetic peripheral angiopathy without gangrene: Secondary | ICD-10-CM | POA: Insufficient documentation

## 2015-03-11 DIAGNOSIS — Z87891 Personal history of nicotine dependence: Secondary | ICD-10-CM | POA: Insufficient documentation

## 2015-03-11 DIAGNOSIS — Z7984 Long term (current) use of oral hypoglycemic drugs: Secondary | ICD-10-CM | POA: Diagnosis not present

## 2015-03-11 DIAGNOSIS — I10 Essential (primary) hypertension: Secondary | ICD-10-CM | POA: Insufficient documentation

## 2015-03-11 DIAGNOSIS — M1990 Unspecified osteoarthritis, unspecified site: Secondary | ICD-10-CM | POA: Insufficient documentation

## 2015-03-13 NOTE — Progress Notes (Signed)
TRANELL, GUTZMER ([id number]) Visit Report for 03/11/2015 Chief Complaint Document Details Patient Name: Spencer [name], Spencer [name] Date of Service: 03/11/2015 1:30 PM Medical Record Number: [id number] Patient Account Number: [id number] Date of Birth/Sex: 10/17/1917 (82 y.o. Male) Treating RN: Montey Hora Primary Care Physician: [name] Other Clinician: Referring Physician: [name] Treating Physician/Extender: Tito Dine in Treatment: 3 Information Obtained from: Patient Chief Complaint Patient is here for review of a nonhealing area on a right transmetatarsal amputation site. He is a type II diabetic on oral agents Electronic Signature(s) Signed: 03/12/2015 8:24:42 AM By: Linton Ham MD Entered By: Linton Ham on 03/11/2015 16:10:29 Spencer [name] ([id number]) -------------------------------------------------------------------------------- Debridement Details Patient Name: Spencer [name] Date of Service: 03/11/2015 1:30 PM Medical Record Number: [id number] Patient Account Number: [id number] Date of Birth/Sex: 1917/08/07 (82 y.o. Male) Treating RN: Montey Hora Primary Care Physician: [name] Other Clinician: Referring Physician: [name] Treating Physician/Extender: Tito Dine in Treatment: 3 Debridement Performed for Wound #2 Right,Medial,Plantar Amputation Site - Transmetatarsal Assessment: Performed By: Physician Ricard Dillon, MD Debridement: Debridement Pre-procedure Yes Verification/Time Out Taken: Start Time: 14:14 Pain Control: Lidocaine 4% Topical Solution Level: Skin/Subcutaneous Tissue Total Area Debrided (L x 9.4 (cm) x 3.3 (cm) = 31.02 (cm) W): Tissue and other Viable, Non-Viable, Eschar, Fibrin/Slough, Subcutaneous material debrided: Instrument: Curette Bleeding: Moderate Hemostasis Achieved: Pressure End Time: 14:19 Procedural Pain: 0 Post Procedural Pain: 0 Response to Treatment: Procedure was  tolerated well Post Debridement Measurements of Total Wound Length: (cm) 9.4 Width: (cm) 3.3 Depth: (cm) 2 Volume: (cm) 48.726 Post Procedure Diagnosis Same as Pre-procedure Electronic Signature(s) Signed: 03/11/2015 5:13:32 PM By: Montey Hora Signed: 03/12/2015 8:24:42 AM By: Linton Ham MD Entered By: Linton Ham on 03/11/2015 16:10:18 Spencer [name] ([id number]) -------------------------------------------------------------------------------- HPI Details Patient Name: Spencer [name] Date of Service: 03/11/2015 1:30 PM Medical Record Number: [id number] Patient Account Number: [id number] Date of Birth/Sex: April 17, 1917 (82 y.o. Male) Treating RN: Montey Hora Primary Care Physician: [name] Other Clinician: Referring Physician: [name] Treating Physician/Extender: Ricard Dillon Weeks in Treatment: 3 History of Present Illness HPI Description: VENOUS STUDIES: ARTERIAL STUDIES: 01/27/15 arteriogram right common femoral profunda femoris is widely patent. SFA is patent. 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 Anterior tibial demonstrates multiple high- grade stenosis with several short segmental occlusions CULTURES: 01/23/15; methicillin sensitive staph aureus Radiology; 01/21/15; soft tissue swelling of the right foot with scattered foci of soft tissue gas. CT SCAN MRI; ANTIBIOTICS PRESCRIBED; 02/18/15 this is a 82 year old man who lives independently at the [facility] [place] [facility]. He was relatively well up until the day before a complex hospitalization from 1/11 through 02/02/15. He apparently had a fall the day before and was noted to have right foot cellulitis in a diabetic foot ulcer with gangrene in the first toe. He was admitted to hospital requiring IV antibiotics for methicillin sensitive staph aureus. He underwent a surgical  debridement of necrotic tissue and again on 1/20. He underwent an arteriogram and a PCI to the right anterior tibial artery. He completed his IV antibiotics on February 3. He was taken to the OR on 1/20 for a right first ray amputation however during this operation it was elected to do a right transmetatarsal amputation. He apparently saw his podiatric surgeon Dr. Vickki Muff last week and was referred here for consideration of HBO. In the interim the medial aspect of his surgical wound is completely dehisced.  The patient is in the nursing home part of the [facility] and is non-weightbearing. Patient is a type 2 diabetic on oral agents 02/25/15; the patient went back to see Dr. Vickki Muff who feels that he needs aggressive wound care. If this fails he'll need an amputation. Culture I did last week was negative. He is apparently following with Dr. Ola Spurr of infectious disease. I didn't know this. As far as I'm aware he is finished antibiotics. He is coming off Medicare A at the skilled facility where he lives him of this allows for consideration of hyperbaric oxygen. 03/04/15; the patient went his cardiologist to define him as "100% risk" requiring hemodynamic monitoring etc. etc. etc. we'll not be able to undergo hyperbaric oxygen therefore. He arrives today with thick adherent slough over a large area of this wound and a history of copious amounts of drainage. He underwent an aggressive surgical debridement to remove densely adherent service slough and nonviable tissue. The base of this wound is still not to bone but it is not far from it. He has probing areas into the soft tissue is foot but no overt infection. He also saw Dr. Ola Spurr of infectious disease 03/11/15; surprisingly and unfortunately the patient did not tolerate a wound VAC at all. I received notes that unfortunately only read after spending some time with the patient. He apparently was taking the wound VAC off pulling on the  cord. Is also apparent that he is walking on the foot. He saw Dr. Vickki Muff his surgeon yesterday we discontinued the wound VAC because of noncompliance. KEONTE, SALZER ([id number]) Electronic Signature(s) Signed: 03/12/2015 8:24:42 AM By: Linton Ham MD Entered By: Linton Ham on 03/11/2015 16:11:41 Spencer [name] ([id number]) -------------------------------------------------------------------------------- Physical Exam Details Patient Name: Spencer [name] Date of Service: 03/11/2015 1:30 PM Medical Record Number: [id number] Patient Account Number: [id number] Date of Birth/Sex: Oct 07, 1917 (82 y.o. Male) Treating RN: Montey Hora Primary Care Physician: [name] Other Clinician: Referring Physician: [name] Treating Physician/Extender: Ricard Dillon Weeks in Treatment: 3 Notes Wound exam; all of this area was covered in a tight adherent slough which underwent debridement. I didn't realize the level of noncompliance or I would've spared him the debridement to. He also has a small open area along the incision line. Electronic Signature(s) Signed: 03/12/2015 8:24:42 AM By: Linton Ham MD Entered By: Linton Ham on 03/11/2015 16:12:28 Spencer [name] ([id number]) -------------------------------------------------------------------------------- Physician Orders Details Patient Name: Spencer [name] Date of Service: 03/11/2015 1:30 PM Medical Record Number: [id number] Patient Account Number: [id number] Date of Birth/Sex: August 20, 1917 (82 y.o. Male) Treating RN: Montey Hora Primary Care Physician: [name] Other Clinician: Referring Physician: [name] Treating Physician/Extender: Tito Dine in Treatment: 3 Verbal / Phone Orders: Yes Clinician: Montey Hora Read Back and Verified: Yes Diagnosis Coding Wound Cleansing Wound #1 Right Amputation Site - Transmetatarsal o Clean wound with Normal Saline. Wound #2  Right,Medial,Plantar Amputation Site - Transmetatarsal o Clean wound with Normal Saline. Anesthetic Wound #1 Right Amputation Site - Transmetatarsal o Topical Lidocaine 4% cream applied to wound bed prior to debridement Wound #2 Right,Medial,Plantar Amputation Site - Transmetatarsal o Topical Lidocaine 4% cream applied to wound bed prior to debridement Primary Wound Dressing Wound #1 Right Amputation Site - Transmetatarsal o Aquacel Ag Wound #2 Right,Medial,Plantar Amputation Site - Transmetatarsal o Saline moistened gauze Secondary Dressing Wound #1 Right Amputation Site - Transmetatarsal o Gauze, ABD and Kerlix/Conform Wound #2 Right,Medial,Plantar Amputation Site - Transmetatarsal o Gauze, ABD and Kerlix/Conform Dressing Change Frequency Wound #1  Right Amputation Site - Transmetatarsal o Change dressing every day. Wound #2 Right,Medial,Plantar Amputation Site - Transmetatarsal o Change dressing every day. Follow-up Appointments SADIKI, LOLLIE ([id number]) Wound #1 Right Amputation Site - Transmetatarsal o Other: - return to [place] if needed otherwise follow up with Dr Vickki Muff Wound #2 Right,Medial,Plantar Amputation Site - Transmetatarsal o Other: - return to [place] if needed otherwise follow up with Dr Vickki Muff Off-Loading Wound #1 Right Amputation Site - Transmetatarsal o Heel suspension boot to: - SAGE Boots Wound #2 Right,Medial,Plantar Amputation Site - Transmetatarsal o Heel suspension boot to: - Tax inspector Additional Orders / Instructions Wound #1 Right Amputation Site - Transmetatarsal o Other: - Patient is still non weight bearing on right leg Wound #2 Right,Medial,Plantar Amputation Site - Transmetatarsal o Other: - Patient is still non weight bearing on right leg Negative Pressure Wound Therapy Wound #2 Right,Medial,Plantar Amputation Site - Transmetatarsal o Discontinue NPWT. - Please return wound vac to [facility] -  call [place] if you are unsure how to do this Electronic Signature(s) Signed: 03/11/2015 5:13:32 PM By: Montey Hora Signed: 03/12/2015 8:24:42 AM By: Linton Ham MD Entered By: Montey Hora on 03/11/2015 14:32:05 Spencer [name] ([id number]) -------------------------------------------------------------------------------- Problem List Details Patient Name: Spencer [name] Date of Service: 03/11/2015 1:30 PM Medical Record Number: [id number] Patient Account Number: [id number] Date of Birth/Sex: 10-15-1917 (82 y.o. 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 E11.621 Type 2 diabetes mellitus with foot ulcer 02/18/2015 Yes T87.53 Necrosis of amputation stump, right lower extremity 02/18/2015 Yes E11.51 Type 2 diabetes mellitus with diabetic peripheral 02/18/2015 Yes angiopathy without gangrene Inactive Problems Resolved Problems Electronic Signature(s) Signed: 03/12/2015 8:24:42 AM By: Linton Ham MD Entered By: Linton Ham on 03/11/2015 16:09:11 Spencer [name] ([id number]) -------------------------------------------------------------------------------- Progress Note Details Patient Name: Spencer [name] Date of Service: 03/11/2015 1:30 PM Medical Record Number: [id number] Patient Account Number: [id number] Date of Birth/Sex: 06-07-1917 (82 y.o. Male) Treating RN: Montey Hora Primary Care Physician: [name] Other Clinician: Referring Physician: [name] Treating Physician/Extender: Tito Dine in Treatment: 3 Subjective Chief Complaint Information obtained from Patient Patient is here for review of a nonhealing  area on a right transmetatarsal amputation site. He is a type II diabetic on oral agents History of Present Illness (HPI) VENOUS STUDIES: ARTERIAL STUDIES: 01/27/15 arteriogram right common femoral profunda femoris is widely patent. SFA is patent. 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 Anterior tibial demonstrates multiple high- grade stenosis with several short segmental occlusions CULTURES: 01/23/15; methicillin sensitive staph aureus Radiology; 01/21/15; soft tissue swelling of the right foot with scattered foci of soft tissue gas. CT SCAN MRI; ANTIBIOTICS PRESCRIBED; 02/18/15 this is a 82 year old man who lives independently at the [facility] [place] [facility]. He was relatively well up until the day before a complex hospitalization from 1/11 through 02/02/15. He apparently had a fall the day before and was noted to have right foot cellulitis in a diabetic foot ulcer with gangrene in the first toe. He was admitted to hospital requiring IV antibiotics for methicillin sensitive staph aureus. He underwent a surgical debridement of necrotic tissue and again on 1/20. He underwent an arteriogram  and a PCI to the right anterior tibial artery. He completed his IV antibiotics on February 3. He was taken to the OR on 1/20 for a right first ray amputation however during this operation it was elected to do a right transmetatarsal amputation. He apparently saw his podiatric surgeon Dr. Vickki Muff last week and was referred here for consideration of HBO. In the interim the medial aspect of his surgical wound is completely dehisced. The patient is in the nursing home part of the [facility] [place] [facility] and is non-weightbearing. Patient is a type 2 diabetic on oral agents 02/25/15; the patient went back to see Dr. Vickki Muff who feels that he needs aggressive wound care. If this fails he'll need an  amputation. Culture I did last week was negative. He is apparently following with Dr. Ola Spurr of infectious disease. I didn't know this. As far as I'm aware he is finished antibiotics. He is coming off Medicare A at the skilled facility where he lives him of this allows for consideration of hyperbaric oxygen. 03/04/15; the patient went his cardiologist to define him as "100% risk" requiring hemodynamic monitoring etc. etc. etc. we'll not be able to undergo hyperbaric oxygen therefore. He arrives today with thick adherent slough over a large area of this wound and a history of copious amounts of drainage. He underwent an aggressive surgical debridement to remove densely adherent service slough and nonviable tissue. The base of this wound is still not to bone but it is not far from it. He has probing areas into the soft tissue is foot Humphries, Agustine ([id number]) but no overt infection. He also saw Dr. Ola Spurr of infectious disease 03/11/15; surprisingly and unfortunately the patient did not tolerate a wound VAC at all. I received notes that unfortunately only read after spending some time with the patient. He apparently was taking the wound VAC off pulling on the cord. Is also apparent that he is walking on the foot. He saw Dr. Vickki Muff his surgeon yesterday we discontinued the wound VAC because of noncompliance. Objective Constitutional Vitals Time Taken: 1:49 PM, Height: 66 in, Weight: 160 lbs, BMI: 25.8, Temperature: 98.4 F, Pulse: 55 bpm, Respiratory Rate: 18 breaths/min, Blood Pressure: 134/45 mmHg. Integumentary (Hair, Skin) Wound #1 status is Open. Original cause of wound was Surgical Injury. The wound is located on the Right Amputation Site - Transmetatarsal. The wound measures 9.5cm length x 0.8cm width x 0.2cm depth; 5.969cm^2 area and 1.194cm^3 volume. The wound is limited to skin breakdown. There is no tunneling or undermining noted. There is a small amount of serosanguineous  drainage noted. The wound margin is distinct with the outline attached to the wound base. There is small (1-33%) red, pink granulation within the wound bed. There is a large (67-100%) amount of necrotic tissue within the wound bed including Eschar and Adherent Slough. The periwound skin appearance exhibited: Maceration, Moist. The periwound skin appearance did not exhibit: Callus, Crepitus, Excoriation, Fluctuance, Friable, Induration, Localized Edema, Rash, Scarring, Dry/Scaly, Atrophie Blanche, Cyanosis, Ecchymosis, Hemosiderin Staining, Mottled, Pallor, Rubor, Erythema. Periwound temperature was noted as No Abnormality. Wound #2 status is Open. Original cause of wound was Surgical Injury. The wound is located on the Right,Medial,Plantar Amputation Site - Transmetatarsal. The wound measures 9.4cm length x 3.3cm width x 2cm depth; 24.363cm^2 area and 48.726cm^3 volume. The wound is limited to skin breakdown. There is no tunneling or undermining noted. There is a medium amount of serosanguineous drainage noted. The wound margin is distinct with the outline attached to the  wound base. There is small (1-33%) red granulation within the wound bed. There is a large (67-100%) amount of necrotic tissue within the wound bed including Eschar and Adherent Slough. The periwound skin appearance exhibited: Maceration, Moist. The periwound skin appearance did not exhibit: Callus, Crepitus, Excoriation, Fluctuance, Friable, Induration, Localized Edema, Rash, Scarring, Dry/Scaly, Atrophie Blanche, Cyanosis, Ecchymosis, Hemosiderin Staining, Mottled, Pallor, Rubor, Erythema. Periwound temperature was noted as No Abnormality. Assessment Active Problems ICD-10 INRI, GUNION ([id number]) [id number] - Non-pressure chronic ulcer of other part of right foot with necrosis of muscle E11.621 - Type 2 diabetes mellitus with foot ulcer T87.53 - Necrosis of amputation stump, right lower extremity E11.51 - Type 2 diabetes  mellitus with diabetic peripheral angiopathy without gangrene Procedures Wound #2 Wound #2 is a Diabetic Wound/Ulcer of the Lower Extremity located on the Right,Medial,Plantar Amputation Site - Transmetatarsal . There was a Skin/Subcutaneous Tissue Debridement [id number]) debridement with total area of 31.02 sq cm performed by Ricard Dillon, MD. with the following instrument(s): Curette to remove Viable and Non-Viable tissue/material including Fibrin/Slough, Eschar, and Subcutaneous after achieving pain control using Lidocaine 4% Topical Solution. A time out was conducted prior to the start of the procedure. A Moderate amount of bleeding was controlled with Pressure. The procedure was tolerated well with a pain level of 0 throughout and a pain level of 0 following the procedure. Post Debridement Measurements: 9.4cm length x 3.3cm width x 2cm depth; 48.726cm^3 volume. Post procedure Diagnosis Wound #2: Same as Pre-Procedure Plan Wound Cleansing: Wound #1 Right Amputation Site - Transmetatarsal: Clean wound with Normal Saline. Wound #2 Right,Medial,Plantar Amputation Site - Transmetatarsal: Clean wound with Normal Saline. Anesthetic: Wound #1 Right Amputation Site - Transmetatarsal: Topical Lidocaine 4% cream applied to wound bed prior to debridement Wound #2 Right,Medial,Plantar Amputation Site - Transmetatarsal: Topical Lidocaine 4% cream applied to wound bed prior to debridement Primary Wound Dressing: Wound #1 Right Amputation Site - Transmetatarsal: Aquacel Ag Wound #2 Right,Medial,Plantar Amputation Site - Transmetatarsal: Saline moistened gauze Secondary Dressing: Wound #1 Right Amputation Site - Transmetatarsal: Gauze, ABD and Kerlix/Conform Wound #2 Right,Medial,Plantar Amputation Site - Transmetatarsal: Gauze, ABD and Kerlix/Conform Porta, Karin ([id number]) Dressing Change Frequency: Wound #1 Right Amputation Site - Transmetatarsal: Change dressing every  day. Wound #2 Right,Medial,Plantar Amputation Site - Transmetatarsal: Change dressing every day. Follow-up Appointments: Wound #1 Right Amputation Site - Transmetatarsal: Other: - return to [place] if needed otherwise follow up with Dr Vickki Muff Wound #2 Right,Medial,Plantar Amputation Site - Transmetatarsal: Other: - return to [place] if needed otherwise follow up with Dr Vickki Muff Off-Loading: Wound #1 Right Amputation Site - Transmetatarsal: Heel suspension boot to: - SAGE Boots Wound #2 Right,Medial,Plantar Amputation Site - Transmetatarsal: Heel suspension boot to: - Tax inspector Additional Orders / Instructions: Wound #1 Right Amputation Site - Transmetatarsal: Other: - Patient is still non weight bearing on right leg Wound #2 Right,Medial,Plantar Amputation Site - Transmetatarsal: Other: - Patient is still non weight bearing on right leg Negative Pressure Wound Therapy: Wound #2 Right,Medial,Plantar Amputation Site - Transmetatarsal: Discontinue NPWT. - Please return wound vac to [facility] - call [place] if you are unsure how to do this #1 the patient wound was divided before I really was able to put together all the information from the [facility] [place] facility. He was not compliant at all with the wound VAC and it was discontinued yesterday by his surgeon. I was really surprised about this the patient seemed more coherent and able to engage in a  discussion. Today either because of some confusional state or because of his extreme hearing loss he just didn't seem coherent enough to make this decision. #2 of all of this I agree with the wet to dry dressings, Aquacel Ag over the linear wound in his surgical incision. #3 is going to require an amputation on the right. I would counsel about taking this to at least a BKA, would like to get through and one surgery given the severity of his aortic stenosis. #4 this is a very sad outcome at one point I thought we had a chance  with this. However in view of his noncompliance the deterioration the wound. He will be discharged from the clinic Electronic Signature(s) Signed: 03/12/2015 8:24:42 AM By: Linton Ham MD Entered By: Linton Ham on 03/11/2015 16:14:54 Spencer [name] ([id number]) -------------------------------------------------------------------------------- [place] Details Patient Name: Spencer [name] Date of Service: 03/11/2015 Medical Record Number: [id number] Patient Account Number: [id number] Date of Birth/Sex: 15-Nov-1917 (82 y.o. Male) Treating RN: Montey Hora Primary Care Physician: [name] Other Clinician: Referring Physician: [name] Treating Physician/Extender: Tito Dine in Treatment: 3 Diagnosis Coding ICD-10 Codes Code Description [phone] Non-pressure chronic ulcer of other part of right foot with necrosis of muscle E11.621 Type 2 diabetes mellitus with foot ulcer T87.53 Necrosis of amputation stump, right lower extremity E11.51 Type 2 diabetes mellitus with diabetic peripheral angiopathy without gangrene Facility Procedures CPT4: Description Modifier Quantity Code [id number] - DEB SUBQ TISSUE 20 SQ CM/< 1 ICD-10 Description Diagnosis L97.513 Non-pressure chronic ulcer of other part of right foot with necrosis of muscle CPT4: [id number] - DEB SUBQ TISS EA ADDL 20CM 1 ICD-10 Description Diagnosis L97.513 Non-pressure chronic ulcer of other part of right foot with necrosis of muscle Physician Procedures CPT4: Description Modifier Quantity Code [id number] - WC PHYS SUBQ TISS 20 SQ CM 1 ICD-10 Description Diagnosis L97.513 Non-pressure chronic ulcer of other part of right foot with necrosis of muscle CPT4: [id number] - WC PHYS SUBQ TISS EA ADDL 20 CM 1 ICD-10 Description Diagnosis L97.513 TURNER, ECKART ([id number]) Electronic Signature(s) Signed: 03/12/2015 8:24:42 AM By: Linton Ham MD Entered By: Linton Ham on 03/11/2015  16:15:25

## 2015-03-13 NOTE — Progress Notes (Addendum)
RECTOR, BELYEU ([id number]) Visit Report for 03/11/2015 Arrival Information Details Patient Name: Spencer [name], Spencer [name] Date of Service: 03/11/2015 1:30 PM Medical Record Number: [id number] Patient Account Number: [id number] Date of Birth/Sex: 1917/09/03 (82 y.o. Male) Treating RN: Montey Hora Primary Care Physician: [name] Other Clinician: Referring Physician: [name] Treating Physician/Extender: Tito Dine in Treatment: 3 Visit Information History Since Last Visit Added or deleted any medications: No Patient Arrived: Wheel Chair Any new allergies or adverse reactions: No Arrival Time: 13:46 Had a fall or experienced change in No Accompanied By: son activities of daily living that may affect Transfer Assistance: Manual risk of falls: Patient Identification Verified: Yes Signs or symptoms of abuse/neglect since last No Secondary Verification Process Yes visito Completed: Hospitalized since last visit: No Patient Requires Transmission- No Pain Present Now: No Based Precautions: Patient Has Alerts: Yes Patient Alerts: Patient on Blood Thinner Eliquis Electronic Signature(s) Signed: 03/11/2015 5:13:32 PM By: Montey Hora Entered By: Montey Hora on 03/11/2015 13:48:46 Spencer [name] ([id number]) -------------------------------------------------------------------------------- Encounter Discharge Information Details Patient Name: Spencer [name] Date of Service: 03/11/2015 1:30 PM Medical Record Number: [id number] Patient Account Number: [id number] Date of Birth/Sex: [birth year] (82 y.o. Male) Treating RN: Montey Hora Primary Care Physician: [name] Other Clinician: Referring Physician: [name] Treating Physician/Extender: Tito Dine in Treatment: 3 Encounter Discharge Information Items Discharge Pain Level: 0 Discharge Condition: Stable Ambulatory Status: Wheelchair Discharge Destination: Nursing  Home Transportation: Private Auto Accompanied By: son Schedule Follow-up Appointment: No Medication Reconciliation completed No and provided to Patient/Care Marisabel Macpherson: Provided on Clinical Summary of Care: 03/11/2015 Form Type Recipient Paper Patient [facility] Electronic Signature(s) Signed: 03/11/2015 3:42:53 PM By: Montey Hora Previous Signature: 03/11/2015 2:44:50 PM Version By: Ruthine Dose Entered By: Montey Hora on 03/11/2015 15:42:53 Spencer [name] ([id number]) -------------------------------------------------------------------------------- Multi Wound Chart Details Patient Name: Spencer [name] Date of Service: 03/11/2015 1:30 PM Medical Record Number: [id number] Patient Account Number: [id number] Date of Birth/Sex: Jun 08, 1917 (82 y.o. Male) Treating RN: Montey Hora Primary Care Physician: [name] Other Clinician: Referring Physician: [name] Treating Physician/Extender: Ricard Dillon Weeks in Treatment: 3 Vital Signs Height(in): 66 Pulse(bpm): 55 Weight(lbs): 160 Blood Pressure 134/45 (mmHg): Body Mass Index(BMI): 26 Temperature(F): 98.4 Respiratory Rate 18 (breaths/min): Photos: [1:No Photos] [2:No Photos] [N/A:N/A] Wound Location: [1:Right Amputation Site - Right Amputation Site - Transmetatarsal] [2:Transmetatarsal - Plantar, Medial] [N/A:N/A] Wounding Event: [1:Surgical Injury] [2:Surgical Injury] [N/A:N/A] 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] [2:the Lower Extremity] Secondary Etiology: [1:Compromised Skin Graft/Flap] [2:Compromised Skin Graft/Flap] [N/A:N/A] Comorbid History: [1:Cataracts, Anemia, Hypertension, Peripheral Hypertension, Peripheral Arterial Disease, Type II Arterial Disease, Type II Diabetes, Osteoarthritis Diabetes, Osteoarthritis] [2:Cataracts, Anemia,] [N/A:N/A] Date Acquired: [1:02/05/2015] [2:02/06/2015] [N/A:N/A] Weeks of Treatment: [1:3] [2:3] [N/A:N/A] Wound  Status: [1:Open] [2:Open] [N/A:N/A] Measurements L x W x D 9.5x0.8x0.2 [2:9.4x3.3x2] [N/A:N/A] (cm) Area (cm) : [1:5.969] [2:24.363] [N/A:N/A] Volume (cm) : [1:1.194] [2:48.726] [N/A:N/A] % Reduction in Area: [1:73.80%] [2:-72.30%] [N/A:N/A] % Reduction in Volume: 47.60% [2:-1623.60%] [N/A:N/A] Classification: [1:Grade 1] [2:Grade 1] [N/A:N/A] Exudate Amount: [1:Small] [2:Medium] [N/A:N/A] Exudate Type: [1:Serosanguineous] [2:Serosanguineous] [N/A:N/A] Exudate Color: [1:red, brown] [2:red, brown] [N/A:N/A] Wound Margin: [1:Distinct, outline attached Distinct, outline attached N/A] Granulation Amount: [1:Small (1-33%)] [2:Small (1-33%)] [N/A:N/A] Granulation Quality: [1:Red, Pink] [2:Red] [N/A:N/A] Necrotic Amount: [1:Large (67-100%)] [2:Large (67-100%)] [N/A:N/A] Necrotic Tissue: Eschar, Adherent Slough Eschar, Adherent Slough N/A Exposed Structures: Fascia: No Fascia: No N/A Fat: No Fat: No Tendon: No Tendon: No Muscle: No Muscle: No Joint: No Joint: No Bone: No Bone: No Limited to  Skin Limited to Skin Breakdown Breakdown Epithelialization: Small (1-33%) None N/A Periwound Skin Texture: Edema: No Edema: No N/A Excoriation: No Excoriation: No Induration: No Induration: No Callus: No Callus: No Crepitus: No Crepitus: No Fluctuance: No Fluctuance: No Friable: No Friable: No Rash: No Rash: No Scarring: No Scarring: No Periwound Skin Maceration: Yes Maceration: Yes N/A Moisture: Moist: Yes Moist: Yes Dry/Scaly: No Dry/Scaly: No Periwound Skin Color: Atrophie Blanche: No Atrophie Blanche: No N/A Cyanosis: No Cyanosis: No Ecchymosis: No Ecchymosis: No Erythema: No Erythema: No Hemosiderin Staining: No Hemosiderin Staining: No Mottled: No Mottled: No Pallor: No Pallor: No Rubor: No Rubor: No Temperature: No Abnormality No Abnormality N/A Tenderness on No No N/A Palpation: Wound Preparation: Ulcer Cleansing: Ulcer Cleansing:  N/A Rinsed/Irrigated with Rinsed/Irrigated with Saline Saline Topical Anesthetic Topical Anesthetic Applied: Other: Lidocaine Applied: Other: lidocaine 4% 4% Treatment Notes Electronic Signature(s) Signed: 03/11/2015 5:13:32 PM By: Montey Hora Entered By: Montey Hora on 03/11/2015 14:15:49 Spencer [name] ([id number]) -------------------------------------------------------------------------------- Multi-Disciplinary Care Plan Details Patient Name: Spencer [name] Date of Service: 03/11/2015 1:30 PM Medical Record Number: [id number] Patient Account Number: [id number] Date of Birth/Sex: 1917-04-25 (82 y.o. Male) Treating RN: Montey Hora Primary Care Physician: [name] Other Clinician: Referring Physician: [name] Treating Physician/Extender: Tito Dine in Treatment: 3 Active Inactive Electronic Signature(s) Signed: 03/17/2015 12:52:25 PM By: Montey Hora Previous Signature: 03/11/2015 5:13:32 PM Version By: Montey Hora Entered By: Montey Hora on 03/17/2015 12:52:25 Spencer [name] ([id number]) -------------------------------------------------------------------------------- Patient/Caregiver Education Details Patient Name: Spencer [name] Date of Service: 03/11/2015 1:30 PM Medical Record Number: [id number] Patient Account Number: [id number] Date of Birth/Gender: 16-Mar-1917 (82 y.o. Male) Treating RN: Montey Hora Primary Care Physician: [name] Other Clinician: Referring Physician: [name] Treating Physician/Extender: Tito Dine in Treatment: 3 Education Assessment Education Provided To: Patient and Caregiver Education Topics Provided Wound/Skin Impairment: Handouts: Other: f/u if needed Methods: Explain/Verbal, Printed Responses: State content correctly Electronic Signature(s) Signed: 03/11/2015 3:43:18 PM By: Montey Hora Entered By: Montey Hora on 03/11/2015 15:43:18 Spencer [name]  ([id number]) -------------------------------------------------------------------------------- Wound Assessment Details Patient Name: Spencer [name] Date of Service: 03/11/2015 1:30 PM Medical Record Number: [id number] Patient Account Number: [id number] Date of Birth/Sex: 03-Jan-1918 (82 y.o. Male) Treating RN: Montey Hora Primary Care Physician: [name] Other Clinician: Referring Physician: [name] Treating Physician/Extender: Tito Dine in Treatment: 3 Wound Status Wound Number: 1 Primary Diabetic Wound/Ulcer of the Lower Etiology: Extremity Wound Location: Right Amputation Site - Transmetatarsal Secondary Compromised Skin Graft/Flap Etiology: Wounding Event: Surgical Injury Wound Open Date Acquired: 02/05/2015 Status: Weeks Of Treatment: 3 Comorbid Cataracts, Anemia, Hypertension, Clustered Wound: No History: Peripheral Arterial Disease, Type II Diabetes, Osteoarthritis Photos Photo Uploaded By: Montey Hora on 03/11/2015 16:43:15 Wound Measurements Length: (cm) 9.5 Width: (cm) 0.8 Depth: (cm) 0.2 Area: (cm) 5.969 Volume: (cm) 1.194 % Reduction in Area: 73.8% % Reduction in Volume: 47.6% Epithelialization: Small (1-33%) Tunneling: No Undermining: No Wound Description Classification: Grade 1 Wound Margin: Distinct, outline attached Exudate Amount: Small Exudate Type: Serosanguineous Exudate Color: red, brown Foul Odor After Cleansing: No Wound Bed Granulation Amount: Small (1-33%) Exposed Structure Granulation Quality: Red, Pink Fascia Exposed: No Sindt, Miklo ([id number]) Necrotic Amount: Large (67-100%) Fat Layer Exposed: No Necrotic Quality: Eschar, Adherent Slough Tendon Exposed: No Muscle Exposed: No Joint Exposed: No Bone Exposed: No Limited to Skin Breakdown Periwound Skin Texture Texture Color No Abnormalities Noted: No No Abnormalities Noted: No Callus: No Atrophie Blanche: No Crepitus: No Cyanosis:  No Excoriation: No Ecchymosis: No Fluctuance: No Erythema:  No Friable: No Hemosiderin Staining: No Induration: No Mottled: No Localized Edema: No Pallor: No Rash: No Rubor: No Scarring: No Temperature / Pain Moisture Temperature: No Abnormality No Abnormalities Noted: No Dry / Scaly: No Maceration: Yes Moist: Yes Wound Preparation Ulcer Cleansing: Rinsed/Irrigated with Saline Topical Anesthetic Applied: Other: Lidocaine 4%, Electronic Signature(s) Signed: 03/11/2015 5:13:32 PM By: Montey Hora Entered By: Montey Hora on 03/11/2015 14:13:47 Spencer [name] ([id number]) -------------------------------------------------------------------------------- Wound Assessment Details Patient Name: Spencer [name] Date of Service: 03/11/2015 1:30 PM Medical Record Number: [id number] Patient Account Number: [id number] Date of Birth/Sex: 12/04/1917 (82 y.o. Male) Treating RN: Montey Hora Primary Care Physician: [name] Other Clinician: Referring Physician: [name] Treating Physician/Extender: Tito Dine in Treatment: 3 Wound Status Wound Number: 2 Primary Diabetic Wound/Ulcer of the Lower Etiology: Extremity Wound Location: Right Amputation Site - Transmetatarsal - Plantar, Secondary Compromised Skin Graft/Flap Medial Etiology: Wounding Event: Surgical Injury Wound Open Status: Date Acquired: 02/06/2015 Comorbid Cataracts, Anemia, Hypertension, Weeks Of Treatment: 3 History: Peripheral Arterial Disease, Type II Clustered Wound: No Diabetes, Osteoarthritis Photos Photo Uploaded By: Montey Hora on 03/11/2015 16:43:17 Wound Measurements Length: (cm) 9.4 Width: (cm) 3.3 Depth: (cm) 2 Area: (cm) 24.363 Volume: (cm) 48.726 % Reduction in Area: -72.3% % Reduction in Volume: -1623.6% Epithelialization: None Tunneling: No Undermining: No Wound Description Classification: Grade 1 Wound Margin: Distinct, outline attached Exudate Amount:  Medium Exudate Type: Serosanguineous Exudate Color: red, brown Foul Odor After Cleansing: No Wound Bed Granulation Amount: Small (1-33%) Exposed Structure Granulation Quality: Red Fascia Exposed: No Marcantel, Jaydyn ([id number]) Necrotic Amount: Large (67-100%) Fat Layer Exposed: No Necrotic Quality: Eschar, Adherent Slough Tendon Exposed: No Muscle Exposed: No Joint Exposed: No Bone Exposed: No Limited to Skin Breakdown Periwound Skin Texture Texture Color No Abnormalities Noted: No No Abnormalities Noted: No Callus: No Atrophie Blanche: No Crepitus: No Cyanosis: No Excoriation: No Ecchymosis: No Fluctuance: No Erythema: No Friable: No Hemosiderin Staining: No Induration: No Mottled: No Localized Edema: No Pallor: No Rash: No Rubor: No Scarring: No Temperature / Pain Moisture Temperature: No Abnormality No Abnormalities Noted: No Dry / Scaly: No Maceration: Yes Moist: Yes Wound Preparation Ulcer Cleansing: Rinsed/Irrigated with Saline Topical Anesthetic Applied: Other: lidocaine 4%, Electronic Signature(s) Signed: 03/11/2015 5:13:32 PM By: Montey Hora Entered By: Montey Hora on 03/11/2015 14:14:02 Spencer [name] ([id number]) -------------------------------------------------------------------------------- Vitals Details Patient Name: Spencer [name] Date of Service: 03/11/2015 1:30 PM Medical Record Number: [id number] Patient Account Number: [id number] Date of Birth/Sex: 1917/07/08 (82 y.o. Male) Treating RN: Montey Hora Primary Care Physician: [name] Other Clinician: Referring Physician: [name] Treating Physician/Extender: Tito Dine in Treatment: 3 Vital Signs Time Taken: 13:49 Temperature (F): 98.4 Height (in): 66 Pulse (bpm): 55 Weight (lbs): 160 Respiratory Rate (breaths/min): 18 Body Mass Index (BMI): 25.8 Blood Pressure (mmHg): 134/45 Reference Range: 80 - 120 mg / dl Electronic Signature(s) Signed:  03/11/2015 5:13:32 PM By: Montey Hora Entered By: Montey Hora on 03/11/2015 13:50:55

## 2015-03-20 ENCOUNTER — Encounter: Payer: Self-pay | Admitting: Podiatry

## 2015-03-26 DIAGNOSIS — E11610 Type 2 diabetes mellitus with diabetic neuropathic arthropathy: Secondary | ICD-10-CM | POA: Diagnosis not present

## 2015-03-26 DIAGNOSIS — I4891 Unspecified atrial fibrillation: Secondary | ICD-10-CM | POA: Diagnosis not present

## 2015-03-26 DIAGNOSIS — I350 Nonrheumatic aortic (valve) stenosis: Secondary | ICD-10-CM | POA: Diagnosis not present

## 2015-03-26 DIAGNOSIS — I739 Peripheral vascular disease, unspecified: Secondary | ICD-10-CM

## 2015-03-26 DIAGNOSIS — Z89419 Acquired absence of unspecified great toe: Secondary | ICD-10-CM | POA: Diagnosis not present

## 2015-04-06 ENCOUNTER — Encounter
Admission: RE | Admit: 2015-04-06 | Discharge: 2015-04-06 | Disposition: A | Discharge status: Home / Self Care | Payer: BLUE CROSS/BLUE SHIELD | Source: Ambulatory Visit | Attending: Vascular Surgery | Admitting: Vascular Surgery

## 2015-04-06 ENCOUNTER — Other Ambulatory Visit: Payer: Self-pay | Admitting: Vascular Surgery

## 2015-04-06 ENCOUNTER — Ambulatory Visit
Admission: RE | Admit: 2015-04-06 | Discharge: 2015-04-06 | Disposition: A | Discharge status: Home / Self Care | Payer: BLUE CROSS/BLUE SHIELD | Source: Ambulatory Visit | Attending: Vascular Surgery | Admitting: Vascular Surgery

## 2015-04-06 DIAGNOSIS — Z01818 Encounter for other preprocedural examination: Secondary | ICD-10-CM | POA: Diagnosis not present

## 2015-04-06 DIAGNOSIS — J90 Pleural effusion, not elsewhere classified: Secondary | ICD-10-CM | POA: Insufficient documentation

## 2015-04-06 HISTORY — DX: Hyperlipidemia, unspecified: E78.5

## 2015-04-06 HISTORY — DX: Peripheral vascular disease, unspecified: I73.9

## 2015-04-06 HISTORY — DX: Nonrheumatic aortic (valve) stenosis: I35.0

## 2015-04-06 LAB — BASIC METABOLIC PANEL
Anion gap: 8 (ref 5–15)
BUN: 14 mg/dL (ref 6–20)
CO2: 21 mmol/L — ABNORMAL LOW (ref 22–32)
Calcium: 8.2 mg/dL — ABNORMAL LOW (ref 8.9–10.3)
Chloride: 107 mmol/L (ref 101–111)
Creatinine, Ser: 0.66 mg/dL (ref 0.61–1.24)
GFR calc Af Amer: >60 mL/min (ref >60)
GFR calc non Af Amer: >60 mL/min (ref >60)
Glucose, Bld: 123 mg/dL — ABNORMAL HIGH (ref 65–99)
Potassium: 3.2 mmol/L — ABNORMAL LOW (ref 3.5–5.1)
Sodium: 136 mmol/L (ref 135–145)

## 2015-04-06 LAB — CBC WITH DIFFERENTIAL/PLATELET
Basophils Absolute: 0.1 10*3/uL (ref 0–0.1)
Basophils Relative: 1 %
Eosinophils Absolute: 0.2 10*3/uL (ref 0–0.7)
Eosinophils Relative: 1 %
HCT: 30 % — ABNORMAL LOW (ref 40.0–52.0)
Hemoglobin: 8.8 g/dL — ABNORMAL LOW (ref 13.0–18.0)
Lymphocytes Relative: 26 %
Lymphs Abs: 3.4 10*3/uL (ref 1.0–3.6)
MCH: 19.3 pg — ABNORMAL LOW (ref 26.0–34.0)
MCHC: 29.3 g/dL — ABNORMAL LOW (ref 32.0–36.0)
MCV: 66.1 fL — ABNORMAL LOW (ref 80.0–100.0)
Monocytes Absolute: 1.4 10*3/uL — ABNORMAL HIGH (ref 0.2–1.0)
Monocytes Relative: 11 %
Neutro Abs: 7.9 10*3/uL — ABNORMAL HIGH (ref 1.4–6.5)
Neutrophils Relative %: 61 %
Platelets: 375 10*3/uL (ref 150–440)
RBC: 4.53 MIL/uL (ref 4.40–5.90)
RDW: 19.4 % — ABNORMAL HIGH (ref 11.5–14.5)
WBC: 13 10*3/uL — ABNORMAL HIGH (ref 3.8–10.6)

## 2015-04-06 LAB — SURGICAL PCR SCREEN
MRSA, PCR: NEGATIVE
Staphylococcus aureus: NEGATIVE

## 2015-04-06 LAB — PROTIME-INR
INR: 1.65
PROTHROMBIN TIME: 19.5 s — AB (ref 11.4–15.0)

## 2015-04-06 LAB — APTT: aPTT: 44 seconds — ABNORMAL HIGH (ref 24–36)

## 2015-04-06 NOTE — Patient Instructions (Addendum)
  Your procedure is scheduled on: Friday 04/10/15 Report to Day Surgery. 2ND FLOOR MEDICAL MALL ENTRANCE To find out your arrival time please call [phone] between 1PM - 3PM on Thursday 04/09/15.  Remember: Instructions that are not followed completely may result in serious medical risk, up to and including death, or upon the discretion of your surgeon and anesthesiologist your surgery may need to be rescheduled.    __X__ 1. Do not eat food or drink liquids after midnight. No gum chewing or hard candies.     __X__ 2. No Alcohol for 24 hours before or after surgery.   ____ 3. Bring all medications with you on the day of surgery if instructed.    __X__ 4. Notify your doctor if there is any change in your medical condition     (cold, fever, infections).     Do not wear jewelry, make-up, hairpins, clips or nail polish.  Do not wear lotions, powders, or perfumes.   Do not shave 48 hours prior to surgery. Men may shave face and neck.  Do not bring valuables to the hospital.    [facility] is not responsible for any belongings or valuables.               Contacts, dentures or bridgework may not be worn into surgery.  Leave your suitcase in the car. After surgery it may be brought to your room.  For patients admitted to the hospital, discharge time is determined by your                treatment team.   Patients discharged the day of surgery will not be allowed to drive home.   Please read over the following fact sheets that you were given:   MRSA Information and Surgical Site Infection Prevention   __X__ Take these medicines the morning of surgery with A SIP OF WATER:    1. CYMBALTA  2. MAY HAVE HYDROCODONE IF NEEDED  3.   4.  5.  6.  ____ Fleet Enema (as directed)   __X__ Use SAGE Soap WIPES as directed  ____ Use inhalers on the day of surgery  __X__ Stop metformin 2 days prior to surgery LAST DOSE 04/07/15   ____ Take 1/2 of usual insulin dose the night before surgery and  none on the morning of surgery.   __x__ Stop Coumadin/Plavix/aspirin/ on STOP XARELTO LAST DOSE TODAY 04/06/15 OK TO CONTINUE ASPIRIN  ____ Stop Anti-inflammatories    ____ Stop supplements until after surgery.    ____ Bring C-Pap to the hospital.

## 2015-04-06 NOTE — Pre-Procedure Instructions (Signed)
ANESTHESIA - REVIEWED WITH DR ADAMS PATIENTS EKG FROM 01/21/15 (EPIC)  AND EKG FROM DR PARASCHOS OFFICE FOR ?/?/2014  NO CLEARANCE REQUESTED. INCLUDED IS PATIENTS MOST RECENT ECHO   Echocardiogram 2D complete8/16/2016  [place]  Component Name Value Range  LV Ejection Fraction (%) 55   Aortic Valve Stenosis Grade moderate   Aortic Valve Regurgitation Grade trivial   Aortic Valve Max Velocity (m/s) 4 m/sec   Aortic Valve Stenosis Mean Gradient (mmHg) 32.3 mmHg   Mitral Valve Regurgitation Grade moderate   Mitral Valve Stenosis Grade none   Tricuspid Valve Regurgitation Grade moderate   Tricuspid Valve Regurgitation Max Velocity (m/s) 4.5 m/sec   Right Ventricle Systolic Pressure (mmHg) 0000000 mmHg   LV End Diastolic Diameter (cm) 3.9 cm  LV End Systolic Diameter (cm) 2.5 cm  LV Septum Wall Thickness (cm) 1.5 cm  LV Posterior Wall Thickness (cm) 1.5 cm  Left Atrium Diameter (cm) 4 cm   Result Narrative                      CARDIOLOGY DEPARTMENT                        [facility]                           [id number]                   [facility]                       Acct #: [id number]         [place], [place], [place] [address]             Date: 08/26/2014 10:08 AM                                                                  Adult Male Age: 82 yrs                     ECHOCARDIOGRAM REPORT                        Outpatient          STUDY:CHEST WALL         TAPE:0000:00: 0:00:00          KC::KCWC           ECHO:Yes   DOPPLER:Yes  FILE:[id number]              MD1:  HEDRICK, JAMES          COLOR:Yes  CONTRAST:No       MACHINE:Philips            Height: 65 in      RV BIOPSY:No         3D:No    SOUND QLTY:Moderate           Weight: 172 lb  MEDIUM:None                                                                  BSA: 1.9  m2 ___________________________________________________________________________________________          HISTORY:DOE, Valvular disease           REASON:Assess, LV function, Assess, Aortic Stenosis       INDICATION:I35.0 Aortic stenosis, moderate  ___________________________________________________________________________________________ ECHOCARDIOGRAPHIC MEASUREMENTS 2D DIMENSIONS AORTA             Values      Normal Range      MAIN PA          Values      Normal Range           Annulus:  nm*       [2.3 - 2.9]                PA Main:  nm*       [1.5 - 2.1]         Aorta Sin:  nm*       [3.1 - 3.7]       RIGHT VENTRICLE       ST Junction:  nm*       [2.6 - 3.2]                RV Base:  3.8 cm    [ < 4.2]         Asc.Aorta:  nm*       [2.6 - 3.4]                 RV Mid:  nm*       [ < 3.5]  LEFT VENTRICLE                                        RV Length:  nm*       [ < 8.6]             LVIDd:  3.9 cm    [4.2 - 5.9]       INFERIOR VENA CAVA             LVIDs:  2.5 cm                              Max. IVC:  nm*       [ <= 2.1]                FS:  37.2 %    [> 25]                    Min. IVC:  nm*               SWT:  1.5 cm    [0.6 - 1.0]                   ------------------               PWT:  1.5 cm    [0.6 - 1.0]  nm* - not measured  LEFT ATRIUM           LA Diam:  4.0 cm    [3.0 - 4.0]       LA A4C Area:  nm*       [ < 20]         LA Volume:  nm*       [18 - 58]  ___________________________________________________________________________________________ ECHOCARDIOGRAPHIC DESCRIPTIONS  AORTIC ROOT         Size:Normal   Dissection:No dissection  AORTIC VALVE     Leaflets:Tricuspid                Morphology:MODERATELY THICKENED     Mobility:PARTIALLY MOBILE  LEFT VENTRICLE         Size:Normal                     Anterior:HYPERCONTRACTILE  Contraction:HYPERCONTRACTILE            Lateral:HYPERCONTRACTILE   Closest EF:>55% (Estimated)              Septal:HYPERCONTRACTILE    LV Masses:No Masses                    Apical:HYPERCONTRACTILE          [id number] LVH CONCENTRIC    Inferior:HYPERCONTRACTILE                                        Posterior:HYPERCONTRACTILE Dias.FxClass:(Grade 2) relaxation abnormal, pseudonormal  MITRAL VALVE     Leaflets:Normal                     Mobility:PARTIALLY MOBILE   Morphology:ANNULAR CALC BOTH  LEFT ATRIUM         Size:MILDLY ENLARGED           LA Masses:No masses    IA Septum:Normal IAS  MAIN PA         Size:Normal  PULMONIC VALVE   Morphology:Normal                     Mobility:Fully mobile  RIGHT VENTRICLE    RV Masses:No Masses                      Size:Normal    Free Wall:Normal                  Contraction:Normal  TRICUSPID VALVE     Leaflets:Normal                     Mobility:Fully mobile   Morphology:Normal  RIGHT ATRIUM         Size:Normal                     RA Other:None      RA Mass:No masses  PERICARDIUM        Fluid:No effusion  INFERIOR VENACAVA         Size:DILATED ABNORMAL RESPIRATORY COLLAPSE   _____________________________________________________________________ DOPPLER ECHO and OTHER SPECIAL PROCEDURES    Aortic:TRIVIAL AR                     MODERATE AS           401.0 cm/sec peak vel          64.3 mmHg peak grad  32.3 mmHg mean grad            1.1 cm^2 by DOPPLER     Mitral:MODERATE MR                    No MS                                          2.6 cm^2 by DOPPLER           MV Inflow E Vel=159.0 cm/sec   MV Annulus E'Vel=4.5 cm/sec           E/E'Ratio=35.3  Tricuspid:MODERATE TR                    No TS           446.0 cm/sec peak TR vel       89.6 mmHg peak RV pressure  Pulmonary:TRIVIAL PR                     No PS     ___________________________________________________________________________________________ INTERPRETATION HYPERCONTRACTILE LV FUNCTION WITH AN ESTIMATED EF = 70 % NORMAL RIGHT VENTRICULAR SYSTOLIC  FUNCTION MODERATE TRICUSPID AND MITRAL VALVE INSUFFICIENCY MODERATE AORTIC VALVE STENOSIS WITH A CALCULATED AVA = 1.1 cm^2 BY CONTINUITY EQUATION MODERATE LVH MILD LA ENLARGEMENT ELEVATED VELOCITIES THROUGH THE LEFT VENTRICULAR OUTFLOW TRACT AT REST NOTED   ___________________________________________________________________________________________ Electronically signed by: MD Miquel Dunn on 08/28/2014 09:53 AM             Performed By: Cephus Shelling, RVT       Ordering Physician: Isaias Cowman  ___________________________________________________________________________________________   Status Results Details   Encounter Summary  2015 August

## 2015-04-07 LAB — TYPE AND SCREEN
ABO/RH(D): AB POS
Antibody Screen: NEGATIVE

## 2015-04-07 LAB — ABO/RH: ABO/RH(D): AB POS

## 2015-04-07 NOTE — Pre-Procedure Instructions (Signed)
KT 3.2 WAS FAXED AND CALLED TO LAURA AT [facility].

## 2015-04-07 NOTE — Pre-Procedure Instructions (Signed)
Dr. Delana Meyer notified of abnormal lab results.  Also notified Anesthesia.

## 2015-04-10 ENCOUNTER — Encounter
Admission: RE | Disposition: A | Discharge status: Skilled Nursing Facility | Payer: Self-pay | Source: Ambulatory Visit | Attending: Vascular Surgery

## 2015-04-10 ENCOUNTER — Inpatient Hospital Stay: Payer: Medicare Other | Admitting: Certified Registered"

## 2015-04-10 ENCOUNTER — Inpatient Hospital Stay
Admission: RE | Admit: 2015-04-10 | Discharge: 2015-04-15 | DRG: 240 | Disposition: A | Discharge status: Skilled Nursing Facility | Payer: Medicare Other | Source: Ambulatory Visit | Attending: Vascular Surgery | Admitting: Vascular Surgery

## 2015-04-10 DIAGNOSIS — Z794 Long term (current) use of insulin: Secondary | ICD-10-CM

## 2015-04-10 DIAGNOSIS — I10 Essential (primary) hypertension: Secondary | ICD-10-CM | POA: Diagnosis present

## 2015-04-10 DIAGNOSIS — Z8042 Family history of malignant neoplasm of prostate: Secondary | ICD-10-CM | POA: Diagnosis not present

## 2015-04-10 DIAGNOSIS — Z79899 Other long term (current) drug therapy: Secondary | ICD-10-CM | POA: Diagnosis not present

## 2015-04-10 DIAGNOSIS — Z8249 Family history of ischemic heart disease and other diseases of the circulatory system: Secondary | ICD-10-CM

## 2015-04-10 DIAGNOSIS — T4145XA Adverse effect of unspecified anesthetic, initial encounter: Secondary | ICD-10-CM | POA: Diagnosis present

## 2015-04-10 DIAGNOSIS — Z833 Family history of diabetes mellitus: Secondary | ICD-10-CM

## 2015-04-10 DIAGNOSIS — Z7982 Long term (current) use of aspirin: Secondary | ICD-10-CM

## 2015-04-10 DIAGNOSIS — I70269 Atherosclerosis of native arteries of extremities with gangrene, unspecified extremity: Secondary | ICD-10-CM | POA: Diagnosis present

## 2015-04-10 DIAGNOSIS — E1152 Type 2 diabetes mellitus with diabetic peripheral angiopathy with gangrene: Secondary | ICD-10-CM | POA: Diagnosis present

## 2015-04-10 DIAGNOSIS — Z7984 Long term (current) use of oral hypoglycemic drugs: Secondary | ICD-10-CM

## 2015-04-10 DIAGNOSIS — F05 Delirium due to known physiological condition: Secondary | ICD-10-CM | POA: Diagnosis present

## 2015-04-10 DIAGNOSIS — Z87891 Personal history of nicotine dependence: Secondary | ICD-10-CM

## 2015-04-10 DIAGNOSIS — F329 Major depressive disorder, single episode, unspecified: Secondary | ICD-10-CM | POA: Diagnosis present

## 2015-04-10 DIAGNOSIS — K5900 Constipation, unspecified: Secondary | ICD-10-CM | POA: Diagnosis present

## 2015-04-10 DIAGNOSIS — E785 Hyperlipidemia, unspecified: Secondary | ICD-10-CM | POA: Diagnosis present

## 2015-04-10 DIAGNOSIS — M869 Osteomyelitis, unspecified: Secondary | ICD-10-CM | POA: Diagnosis present

## 2015-04-10 DIAGNOSIS — E1169 Type 2 diabetes mellitus with other specified complication: Secondary | ICD-10-CM | POA: Diagnosis present

## 2015-04-10 HISTORY — DX: Unspecified atrial fibrillation: I48.91

## 2015-04-10 HISTORY — PX: AMPUTATION: SHX166

## 2015-04-10 LAB — POCT I-STAT 4, (NA,K, GLUC, HGB,HCT)
GLUCOSE: 106 mg/dL — AB (ref 65–99)
HEMATOCRIT: 33 % — AB (ref 39.0–52.0)
Hemoglobin: 11.2 g/dL — ABNORMAL LOW (ref 13.0–17.0)
Potassium: 3.7 mmol/L (ref 3.5–5.1)
Sodium: 144 mmol/L (ref 135–145)

## 2015-04-10 LAB — GLUCOSE, CAPILLARY
GLUCOSE-CAPILLARY: 93 mg/dL (ref 65–99)
GLUCOSE-CAPILLARY: 100 mg/dL — AB (ref 65–99)
Glucose-Capillary: 106 mg/dL — ABNORMAL HIGH (ref 65–99)
Glucose-Capillary: 123 mg/dL — ABNORMAL HIGH (ref 65–99)

## 2015-04-10 SURGERY — AMPUTATION BELOW KNEE
Anesthesia: General | Laterality: Right | Wound class: Contaminated

## 2015-04-10 MED ORDER — FENTANYL CITRATE (PF) 100 MCG/2ML IJ SOLN
INTRAMUSCULAR | Status: AC
  Administered 2015-04-10: 25 ug via INTRAVENOUS
  Filled 2015-04-10: qty 2

## 2015-04-10 MED ORDER — GLYCOPYRROLATE 0.2 MG/ML IJ SOLN
INTRAMUSCULAR | Status: DC | PRN
  Administered 2015-04-10: 0.6 mg via INTRAVENOUS

## 2015-04-10 MED ORDER — HYDRALAZINE HCL 20 MG/ML IJ SOLN
5.0000 mg | INTRAMUSCULAR | Status: DC | PRN
Start: 2015-04-10 — End: 2015-04-15

## 2015-04-10 MED ORDER — ONDANSETRON HCL 4 MG/2ML IJ SOLN
INTRAMUSCULAR | Status: DC | PRN
  Administered 2015-04-10: 4 mg via INTRAVENOUS

## 2015-04-10 MED ORDER — HYDROMORPHONE HCL 1 MG/ML IJ SOLN
0.5000 mg | INTRAMUSCULAR | Status: DC | PRN
  Administered 2015-04-10 (×2): 0.5 mg via INTRAVENOUS
  Filled 2015-04-10 (×3): qty 1

## 2015-04-10 MED ORDER — PANTOPRAZOLE SODIUM 40 MG PO TBEC
40.0000 mg | DELAYED_RELEASE_TABLET | Freq: Every day | ORAL | Status: DC
  Administered 2015-04-10 – 2015-04-15 (×6): 40 mg via ORAL
  Filled 2015-04-10 (×6): qty 1

## 2015-04-10 MED ORDER — LABETALOL HCL 5 MG/ML IV SOLN
10.0000 mg | INTRAVENOUS | Status: DC | PRN
Start: 2015-04-10 — End: 2015-04-15
  Filled 2015-04-10: qty 4

## 2015-04-10 MED ORDER — ETOMIDATE 2 MG/ML IV SOLN
INTRAVENOUS | Status: DC | PRN
  Administered 2015-04-10: 16 mg via INTRAVENOUS

## 2015-04-10 MED ORDER — INSULIN ASPART 100 UNIT/ML ~~LOC~~ SOLN: 0.0000 [IU] | Freq: Every day | SUBCUTANEOUS | Status: DC

## 2015-04-10 MED ORDER — CEFUROXIME SODIUM 1.5 G IJ SOLR
1.5000 g | Freq: Two times a day (BID) | INTRAMUSCULAR | Status: AC
  Administered 2015-04-10 – 2015-04-11 (×2): 1.5 g via INTRAVENOUS
  Filled 2015-04-10 (×2): qty 1.5

## 2015-04-10 MED ORDER — GUAIFENESIN-DM 100-10 MG/5ML PO SYRP
15.0000 mL | ORAL_SOLUTION | ORAL | Status: DC | PRN
Start: 2015-04-10 — End: 2015-04-15

## 2015-04-10 MED ORDER — CEFAZOLIN SODIUM-DEXTROSE 2-4 GM/100ML-% IV SOLN
INTRAVENOUS | Status: AC
  Administered 2015-04-10: 2 g via INTRAVENOUS
  Filled 2015-04-10: qty 100

## 2015-04-10 MED ORDER — ACETAMINOPHEN 650 MG RE SUPP: 325.0000 mg | RECTAL | Status: DC | PRN

## 2015-04-10 MED ORDER — ONDANSETRON HCL 4 MG/2ML IJ SOLN: 4.0000 mg | Freq: Four times a day (QID) | INTRAMUSCULAR | Status: DC | PRN

## 2015-04-10 MED ORDER — OXYCODONE-ACETAMINOPHEN 5-325 MG PO TABS
1.0000 | ORAL_TABLET | ORAL | Status: DC | PRN
  Administered 2015-04-11 – 2015-04-12 (×5): 2 via ORAL
  Administered 2015-04-13 (×2): 1 via ORAL
  Filled 2015-04-10 (×2): qty 2
  Filled 2015-04-10 (×2): qty 1
  Filled 2015-04-10 (×3): qty 2

## 2015-04-10 MED ORDER — ROCURONIUM BROMIDE 100 MG/10ML IV SOLN
INTRAVENOUS | Status: DC | PRN
  Administered 2015-04-10: 5 mg via INTRAVENOUS
  Administered 2015-04-10: 40 mg via INTRAVENOUS
  Administered 2015-04-10: 5 mg via INTRAVENOUS

## 2015-04-10 MED ORDER — SODIUM CHLORIDE 0.9 % IV SOLN
INTRAVENOUS | Status: DC
  Administered 2015-04-10: 12:00:00 via INTRAVENOUS

## 2015-04-10 MED ORDER — CEFAZOLIN SODIUM-DEXTROSE 2-4 GM/100ML-% IV SOLN
2.0000 g | INTRAVENOUS | Status: AC
  Administered 2015-04-10: 2 g via INTRAVENOUS

## 2015-04-10 MED ORDER — POLYETHYLENE GLYCOL 3350 17 G PO PACK: 17.0000 g | PACK | Freq: Every day | ORAL | Status: DC | PRN

## 2015-04-10 MED ORDER — SORBITOL 70 % SOLN
30.0000 mL | Freq: Every day | Status: DC | PRN
  Filled 2015-04-10: qty 30

## 2015-04-10 MED ORDER — SIMVASTATIN 20 MG PO TABS
20.0000 mg | ORAL_TABLET | Freq: Every day | ORAL | Status: DC
  Administered 2015-04-11 – 2015-04-14 (×4): 20 mg via ORAL
  Filled 2015-04-10 (×4): qty 1

## 2015-04-10 MED ORDER — DULOXETINE HCL 20 MG PO CPEP
20.0000 mg | ORAL_CAPSULE | Freq: Every day | ORAL | Status: DC
  Administered 2015-04-11 – 2015-04-15 (×5): 20 mg via ORAL
  Filled 2015-04-10 (×5): qty 1

## 2015-04-10 MED ORDER — FUROSEMIDE 40 MG PO TABS
40.0000 mg | ORAL_TABLET | Freq: Every day | ORAL | Status: DC
  Administered 2015-04-11 – 2015-04-15 (×5): 40 mg via ORAL
  Filled 2015-04-10 (×5): qty 1

## 2015-04-10 MED ORDER — SENNOSIDES-DOCUSATE SODIUM 8.6-50 MG PO TABS
2.0000 | ORAL_TABLET | Freq: Every day | ORAL | Status: DC
  Administered 2015-04-11 – 2015-04-15 (×5): 2 via ORAL
  Filled 2015-04-10 (×5): qty 2

## 2015-04-10 MED ORDER — PHENOL 1.4 % MT LIQD: 1.0000 | OROMUCOSAL | Status: DC | PRN

## 2015-04-10 MED ORDER — SODIUM CHLORIDE 0.9 % IV SOLN
INTRAVENOUS | Status: DC
  Administered 2015-04-10: 16:00:00 via INTRAVENOUS

## 2015-04-10 MED ORDER — ENSURE ENLIVE PO LIQD: 237.0000 mL | Freq: Two times a day (BID) | ORAL | Status: DC

## 2015-04-10 MED ORDER — INSULIN ASPART 100 UNIT/ML ~~LOC~~ SOLN
0.0000 [IU] | Freq: Three times a day (TID) | SUBCUTANEOUS | Status: DC
  Administered 2015-04-10: 1 [IU] via SUBCUTANEOUS
  Administered 2015-04-11: 2 [IU] via SUBCUTANEOUS
  Administered 2015-04-11: 1 [IU] via SUBCUTANEOUS
  Administered 2015-04-12: 2 [IU] via SUBCUTANEOUS
  Administered 2015-04-12: 1 [IU] via SUBCUTANEOUS
  Administered 2015-04-12: 2 [IU] via SUBCUTANEOUS
  Administered 2015-04-13 (×3): 1 [IU] via SUBCUTANEOUS
  Administered 2015-04-14: 3 [IU] via SUBCUTANEOUS
  Administered 2015-04-14: 1 [IU] via SUBCUTANEOUS
  Administered 2015-04-15: 2 [IU] via SUBCUTANEOUS
  Administered 2015-04-15: 1 [IU] via SUBCUTANEOUS
  Filled 2015-04-10: qty 1
  Filled 2015-04-10: qty 2
  Filled 2015-04-10: qty 1
  Filled 2015-04-10: qty 2
  Filled 2015-04-10 (×3): qty 1
  Filled 2015-04-10: qty 2
  Filled 2015-04-10: qty 1
  Filled 2015-04-10 (×2): qty 2
  Filled 2015-04-10: qty 3
  Filled 2015-04-10 (×2): qty 1

## 2015-04-10 MED ORDER — ASPIRIN EC 81 MG PO TBEC
81.0000 mg | DELAYED_RELEASE_TABLET | ORAL | Status: DC
  Administered 2015-04-12 – 2015-04-14 (×2): 81 mg via ORAL
  Filled 2015-04-10 (×2): qty 1

## 2015-04-10 MED ORDER — METOPROLOL TARTRATE 1 MG/ML IV SOLN: 2.0000 mg | INTRAVENOUS | Status: DC | PRN

## 2015-04-10 MED ORDER — OXYCODONE HCL 5 MG/5ML PO SOLN: 5.0000 mg | Freq: Once | ORAL | Status: DC | PRN

## 2015-04-10 MED ORDER — OXYCODONE HCL 5 MG PO TABS: 5.0000 mg | ORAL_TABLET | Freq: Once | ORAL | Status: DC | PRN

## 2015-04-10 MED ORDER — FAMOTIDINE 20 MG PO TABS
20.0000 mg | ORAL_TABLET | Freq: Once | ORAL | Status: AC
  Administered 2015-04-10: 20 mg via ORAL

## 2015-04-10 MED ORDER — FENTANYL CITRATE (PF) 100 MCG/2ML IJ SOLN
25.0000 ug | INTRAMUSCULAR | Status: DC | PRN
  Administered 2015-04-10 (×5): 25 ug via INTRAVENOUS

## 2015-04-10 MED ORDER — ALUM & MAG HYDROXIDE-SIMETH 200-200-20 MG/5ML PO SUSP: 15.0000 mL | ORAL | Status: DC | PRN

## 2015-04-10 MED ORDER — FENTANYL CITRATE (PF) 100 MCG/2ML IJ SOLN
INTRAMUSCULAR | Status: DC | PRN
  Administered 2015-04-10 (×4): 25 ug via INTRAVENOUS
  Administered 2015-04-10: 50 ug via INTRAVENOUS

## 2015-04-10 MED ORDER — NEOSTIGMINE METHYLSULFATE 10 MG/10ML IV SOLN
INTRAVENOUS | Status: DC | PRN
  Administered 2015-04-10: 4 mg via INTRAVENOUS

## 2015-04-10 MED ORDER — FLEET ENEMA 7-19 GM/118ML RE ENEM: 1.0000 | ENEMA | Freq: Once | RECTAL | Status: DC | PRN

## 2015-04-10 MED ORDER — DOCUSATE SODIUM 100 MG PO CAPS
100.0000 mg | ORAL_CAPSULE | Freq: Every day | ORAL | Status: DC
  Administered 2015-04-11 – 2015-04-15 (×5): 100 mg via ORAL
  Filled 2015-04-10 (×5): qty 1

## 2015-04-10 MED ORDER — ACETAMINOPHEN 325 MG PO TABS: 325.0000 mg | ORAL_TABLET | ORAL | Status: DC | PRN

## 2015-04-10 MED ORDER — LIDOCAINE HCL (CARDIAC) 20 MG/ML IV SOLN
INTRAVENOUS | Status: DC | PRN
  Administered 2015-04-10: 80 mg via INTRAVENOUS

## 2015-04-10 MED ORDER — FAMOTIDINE 20 MG PO TABS
ORAL_TABLET | ORAL | Status: AC
  Administered 2015-04-10: 12:00:00
  Filled 2015-04-10: qty 1

## 2015-04-10 SURGICAL SUPPLY — 43 items
BAG COUNTER SPONGE EZ (MISCELLANEOUS) IMPLANT
BANDAGE ELASTIC 4 LF NS (GAUZE/BANDAGES/DRESSINGS) ×3 IMPLANT
BLADE SAW GIGLI 510 (BLADE) ×2 IMPLANT
BLADE SAW GIGLI 510MM (BLADE) ×1
BLADE SURG SZ10 CARB STEEL (BLADE) ×3 IMPLANT
BNDG COHESIVE 4X5 TAN STRL (GAUZE/BANDAGES/DRESSINGS) ×6 IMPLANT
BNDG GAUZE 4.5X4.1 6PLY STRL (MISCELLANEOUS) ×3 IMPLANT
BRUSH SCRUB 4% CHG (MISCELLANEOUS) ×3 IMPLANT
CANISTER SUCT 1200ML W/VALVE (MISCELLANEOUS) ×3 IMPLANT
CHLORAPREP W/TINT 26ML (MISCELLANEOUS) ×3 IMPLANT
COUNTER SPONGE BAG EZ (MISCELLANEOUS)
DRAPE INCISE IOBAN 66X45 STRL (DRAPES) ×6 IMPLANT
ELECT CAUTERY BLADE 6.4 (BLADE) ×3 IMPLANT
ELECT REM PT RETURN 9FT ADLT (ELECTROSURGICAL) ×3
ELECTRODE REM PT RTRN 9FT ADLT (ELECTROSURGICAL) ×1 IMPLANT
GAUZE FLUFF 18X24 1PLY STRL (GAUZE/BANDAGES/DRESSINGS) ×3 IMPLANT
GAUZE PETRO XEROFOAM 5X9 (MISCELLANEOUS) IMPLANT
GAUZE SPONGE 4X4 12PLY STRL (GAUZE/BANDAGES/DRESSINGS) IMPLANT
GLOVE BIO SURGEON STRL SZ7 (GLOVE) ×9 IMPLANT
GLOVE INDICATOR 7.5 STRL GRN (GLOVE) ×9 IMPLANT
GLOVE SURG SYN 8.0 (GLOVE) ×3 IMPLANT
GOWN STRL REUS W/ TWL LRG LVL3 (GOWN DISPOSABLE) ×1 IMPLANT
GOWN STRL REUS W/ TWL XL LVL3 (GOWN DISPOSABLE) ×2 IMPLANT
GOWN STRL REUS W/TWL LRG LVL3 (GOWN DISPOSABLE) ×2
GOWN STRL REUS W/TWL XL LVL3 (GOWN DISPOSABLE) ×4
HANDLE YANKAUER SUCT BULB TIP (MISCELLANEOUS) ×3 IMPLANT
KIT RM TURNOVER STRD PROC AR (KITS) ×3 IMPLANT
LABEL OR SOLS (LABEL) ×3 IMPLANT
NS IRRIG 500ML POUR BTL (IV SOLUTION) ×3 IMPLANT
PACK EXTREMITY ARMC (MISCELLANEOUS) ×3 IMPLANT
PAD ABD DERMACEA PRESS 5X9 (GAUZE/BANDAGES/DRESSINGS) ×3 IMPLANT
PAD PREP 24X41 OB/GYN DISP (PERSONAL CARE ITEMS) ×3 IMPLANT
SPONGE LAP 18X18 5 PK (GAUZE/BANDAGES/DRESSINGS) ×12 IMPLANT
STAPLER SKIN PROX 35W (STAPLE) ×3 IMPLANT
STOCKINETTE M/LG 89821 (MISCELLANEOUS) ×3 IMPLANT
SUT SILK 2 0 (SUTURE) ×2
SUT SILK 2-0 18XBRD TIE 12 (SUTURE) ×1 IMPLANT
SUT VIC AB 0 CT1 36 (SUTURE) ×12 IMPLANT
SUT VIC AB 3-0 SH 27 (SUTURE) ×4
SUT VIC AB 3-0 SH 27X BRD (SUTURE) ×2 IMPLANT
SUT VICRYL PLUS ABS 0 54 (SUTURE) ×3 IMPLANT
TAPE UMBIL 1/8X18 RADIOPA (MISCELLANEOUS) ×3 IMPLANT
TOWEL OR 17X26 4PK STRL BLUE (TOWEL DISPOSABLE) ×3 IMPLANT

## 2015-04-10 NOTE — Consult Note (Addendum)
[place] [place] [facility] NAME: Spencer [name]    MR#:  [id number]  DATE OF BIRTH:  [birth year]  DATE OF CONSULT:  04/10/2015  PRIMARY CARE PHYSICIAN: [name], MD   REQUESTING/REFERRING PHYSICIAN: Dr. Hortencia Pilar  CHIEF COMPLAINT:  No chief complaint on file. Medical management/altered mental status.  HISTORY OF PRESENT ILLNESS:  Spencer [name]  is a 82 y.o. male with a known history of Peripheral vascular disease, diabetes type 2 without complication, hypertension, osteoarthritis, hyperlipidemia, aortic stenosis who presented to the hospital for a right below the knee amputation and postoperatively noted to be somewhat altered and delirious. Hospitalist services were contacted for further treatment and evaluation and also for medical management. Patient presently is somewhat lethargic post surgery but is alert and oriented to time place and person at times. He is complaining of some right leg pain from his surgery. He denies any fevers chills, nausea, vomiting, chest pain, shortness of breath or any other associated symptoms presently.  PAST MEDICAL HISTORY:   Past Medical History  Diagnosis Date  . Diabetes mellitus without complication ([place])   . Hypertension   . Arthritis   . High cholesterol   . Peripheral vascular disease ([place])   . Hyperlipidemia   . Aortic stenosis     PAST SURGICAL HISTOIRY:   Past Surgical History  Procedure Laterality Date  . Appendectomy    . Irrigation and debridement foot Right 01/23/2015    Procedure: IRRIGATION AND DEBRIDEMENT FOOT;  Surgeon: Samara Deist, DPM;  Location: [facility];  Service: Podiatry;  Laterality: Right;  . Peripheral vascular catheterization N/A 01/27/2015    Procedure: Abdominal Aortogram w/Lower Extremity;  Surgeon: Katha Cabal, MD;  Location: [place] [facility];  Service: Cardiovascular;  Laterality: N/A;  . Peripheral vascular catheterization  01/27/2015     Procedure: Lower Extremity Intervention;  Surgeon: Katha Cabal, MD;  Location: [place] [facility];  Service: Cardiovascular;;  . Amputation Right 01/30/2015    Procedure: AMPUTATION FOOT/ TRANSMETATARSAL;  Surgeon: Samara Deist, DPM;  Location: [facility];  Service: Podiatry;  Laterality: Right;    SOCIAL HISTORY:   Social History  Substance Use Topics  . Smoking status: Former Research scientist (life sciences)  . Smokeless tobacco: Never Used  . Alcohol Use: No    FAMILY HISTORY:   Family History  Problem Relation Age of Onset  . Prostate cancer Father     DRUG ALLERGIES:  No Known Allergies  REVIEW OF SYSTEMS:   Review of Systems  Constitutional: Negative for fever and weight loss.  HENT: Negative for congestion, nosebleeds and tinnitus.   Eyes: Negative for blurred vision, double vision and redness.  Respiratory: Negative for cough, hemoptysis and shortness of breath.   Cardiovascular: Negative for chest pain, orthopnea, leg swelling and PND.  Gastrointestinal: Negative for nausea, vomiting, abdominal pain, diarrhea and melena.  Genitourinary: Negative for dysuria, urgency and hematuria.  Musculoskeletal: Negative for joint pain and falls.  Neurological: Negative for dizziness, tingling, sensory change, focal weakness, seizures, weakness and headaches.  Endo/Heme/Allergies: Negative for polydipsia. Does not bruise/bleed easily.  Psychiatric/Behavioral: Negative for depression and memory loss. The patient is not nervous/anxious.      MEDICATIONS AT HOME:   Prior to Admission medications   Medication Sig Start Date End Date Taking? Authorizing Provider  DULoxetine (CYMBALTA) 20 MG capsule Take 20 mg by mouth daily.   Yes Historical Provider, MD  HYDROcodone-acetaminophen (NORCO/VICODIN) 5-325 MG tablet Take 1 tablet by mouth  every 6 (six) hours as needed for moderate pain. 02/02/15  Yes Bettey Costa, MD  Probiotic Product (PROBIOTIC DAILY PO) Take 1 capsule by mouth at bedtime.   Yes  Historical Provider, MD  sennosides-docusate sodium (SENOKOT-S) 8.6-50 MG tablet Take 2 tablets by mouth daily.   Yes Historical Provider, MD  simvastatin (ZOCOR) 20 MG tablet Take 20 mg by mouth at bedtime.    Yes Historical Provider, MD  apixaban (ELIQUIS) 5 MG TABS tablet Take 5 mg by mouth 2 (two) times daily.    Historical Provider, MD  aspirin EC 81 MG tablet Take 81 mg by mouth every other day.     Historical Provider, MD  cephALEXin (KEFLEX) 500 MG capsule Take 500 mg by mouth 2 (two) times daily. Reported on 04/10/2015    Historical Provider, MD  furosemide (LASIX) 40 MG tablet Take 40 mg by mouth daily as needed. Reported on 01/23/2015    Historical Provider, MD  metFORMIN (GLUCOPHAGE-XR) 500 MG 24 hr tablet Take 500 mg by mouth daily.    Historical Provider, MD      VITAL SIGNS:  Blood pressure 159/58, pulse 86, temperature 98 F (36.7 C), temperature source Tympanic, resp. rate 14, height 5\' 5"  (1.651 m), weight 72.576 kg (160 lb), SpO2 100 %.  PHYSICAL EXAMINATION:  GENERAL:  82 y.o.-year-old patient lying in the bed lethargic but in NAD.  EYES: Pupils equal, round, reactive to light and accommodation. No scleral icterus. Extraocular muscles intact.  HEENT: Head atraumatic, normocephalic. Oropharynx and nasopharynx clear. Dry Oral Mucosa. NECK:  Supple, no jugular venous distention. No thyroid enlargement, no tenderness.  LUNGS: Normal breath sounds bilaterally, no wheezing, rales, rhonchi . No use of accessory muscles of respiration.  CARDIOVASCULAR: S1, S2, RRR. II/VI SEM at RSB, rubs, gallops, clicks.  ABDOMEN: Soft, nontender, nondistended. Bowel sounds present. No organomegaly or mass.  EXTREMITIES: No pedal edema, cyanosis, or clubbing. Right below the knee amputation with dressing in place. NEUROLOGIC: Cranial nerves II through XII are intact. No focal motor or sensory deficits appreciated bilaterally  PSYCHIATRIC: The patient is alert and oriented x 2.  SKIN: No obvious  rash, lesion, or ulcer.   LABORATORY PANEL:   CBC  Recent Labs Lab 04/06/15 1434 04/10/15 1158  WBC 13.0*  --   HGB 8.8* 11.2*  HCT 30.0* 33.0*  PLT 375  --    ------------------------------------------------------------------------------------------------------------------  Chemistries   Recent Labs Lab 04/06/15 1434 04/10/15 1158  NA 136 144  K 3.2* 3.7  CL 107  --   CO2 21*  --   GLUCOSE 123* 106*  BUN 14  --   CREATININE 0.66  --   CALCIUM 8.2*  --    ------------------------------------------------------------------------------------------------------------------  Cardiac Enzymes No results for input(s): TROPONINI in the last 168 hours. ------------------------------------------------------------------------------------------------------------------  RADIOLOGY:  No results found.   IMPRESSION AND PLAN:   82 year old male with past medical history of peripheral vascular disease, essential hypertension, diabetes, hyperlipidemia, depression who presented to the hospital due to a elective right below the knee amputation and postoperatively noted to be confused.  1. Confusion/altered mental status-this is likely postop delirium secondary to anesthesia. -Do not suspect any evidence of other focal metabolic/infectious source of his altered mental status. -I would avoid any further benzodiazepines. Use Haldol as needed for agitation. We can use narcotics for pain control for now. Follow mental status. -If he spikes a fever I would get a urinalysis, urine culture and also a chest x-ray.  2. Peripheral vascular  disease status post right below-knee amputation-continue pain control and further care as per vascular surgery.  3. Hyperlipidemia-continue simvastatin.  4. Diabetes type 2 without complication-I will place him on sliding scale insulin and follow blood sugars.  5. Depression-continue Cymbalta.  Thank you so much for the consultation will follow along with  you.  All the records are reviewed and case discussed with Consulting provider. Management plans discussed with the patient, family and they are in agreement.  CODE STATUS: Full  TOTAL TIME TAKING CARE OF THIS PATIENT: 40 minutes.    Henreitta Leber M.D on 04/10/2015 at 3:43 PM  Between 7am to 6pm - Pager - [phone]  After 6pm go to [email or web address] - password [email or web address] [facility]  Office  [phone]  CC: Primary care Physician: [name], MD     Important in the

## 2015-04-10 NOTE — Op Note (Signed)
   OPERATIVE NOTE   PROCEDURE: right below-the-knee amputation  PRE-OPERATIVE DIAGNOSIS: right gangrene  POST-OPERATIVE DIAGNOSIS: same as above  SURGEON: Hortencia Pilar, MD  ASSISTANT(S): Ms. Hezzie Bump  ANESTHESIA: general  ESTIMATED BLOOD LOSS: 300 cc  FINDING(S): Posterior muscle bellies were pale muscle bellies in the anterior compartment  SPECIMEN(S):  right below-the-knee amputation  INDICATIONS:   Spencer [name] is a 82 y.o. male who presents with right leg gangrene.  The patient is scheduled for a right below-the-knee amputation.  I discussed in depth with the patient the risks, benefits, and alternatives to this procedure.  The patient is aware that the risk of this operation included but are not limited to:  bleeding, infection, myocardial infarction, stroke, death, failure to heal amputation wound, and possible need for more proximal amputation.  The patient is aware of the risks and agrees proceed forward with the procedure.  DESCRIPTION:  After full informed written consent was obtained from the patient, the patient was brought back to the operating room, and placed supine upon the operating table.  Prior to induction, the patient received IV antibiotics.  The patient was then prepped and draped in the standard fashion for a below-the-knee amputation.  After obtaining adequate anesthesia, the patient was prepped and draped in the standard fashion for a below-the-knee amputation.  I marked out the anterior incision two finger breadths below the tibial tuberosity and then the marked out a posterior flap that was one third of the circumference of the calf in length.   I made the incisions for these flaps, and then dissected through the subcutaneous tissue, fascia, and muscle anteriorly.  I elevated  the periosteal tissue superiorly so that the tibia was about 3-4 cm shorter than the anterior skin flap.  I then transected the tibia with a Gigli saw beveling the front edge  with the cut.  Then I smoothed out the rough edges.  In a similar fashion, I cut back the fibula about two centimeters higher than the level of the tibia with a bone cutter.  I put a bone hook into the distal tibia and then used a large amputation knife to sharply develop a tissue plane through the muscle along the fibula.  In such fashion, the posterior flap was developed.  At this point, the specimen was passed off the field as the below-the-knee amputation.  At this point, I clamped all visibly bleeding arteries and veins using a combination of suture ligation with Silk suture and electrocautery.  Bleeding continued to be controlled with electrocautery and suture ligature.  The stump was washed off with sterile normal saline and no further active bleeding was noted.  I reapproximated the anterior and posterior fascia  with interrupted stitches of 0 Vicryl.  This was completed along the entire length of anterior and posterior fascia until there were no more loose space in the fascial line. I then placed a layer of 2-0 Vicryl sutures in the subcutaneous tissue. The skin was then  reapproximated with staples.  The stump was washed off and dried.  The incision was dressed with Xeroform and  then fluffs were applied.  Kerlix was wrapped around the leg and then gently an ACE wrap was applied.    COMPLICATIONS: None  CONDITION: Unchanged   Katha Cabal  04/10/2015, 2:05 PM

## 2015-04-10 NOTE — Progress Notes (Signed)
Per Dr. Delana Meyer okay to place order for Ensure per pt request

## 2015-04-10 NOTE — H&P (Signed)
[facility] History & Physical Update  The patient was interviewed and re-examined.  The patient's previous History and Physical has been reviewed and is unchanged.  There is no change in the plan of care. We plan to proceed with the scheduled procedure.  Tenisha Fleece, Dolores Lory, MD  04/10/2015, 11:51 AM

## 2015-04-10 NOTE — Transfer of Care (Signed)
Immediate Anesthesia Transfer of Care Note  Patient: Spencer [name]  Procedure(s) Performed: Procedure(s): AMPUTATION BELOW KNEE (Right)  Patient Location: PACU  Anesthesia Type:General  Level of Consciousness: sedated and responds to stimulation  Airway & Oxygen Therapy: Patient Spontanous Breathing and Patient connected to face mask oxygen  Post-op Assessment: Report given to RN and Post -op Vital signs reviewed and stable  Post vital signs: Reviewed and stable  Last Vitals:  Filed Vitals:   04/10/15 1133 04/10/15 1404  BP: 165/73 168/66  Pulse: 70 74  Temp: 36.7 C 36.7 C  Resp: 16 15    Complications: No apparent anesthesia complications

## 2015-04-10 NOTE — Anesthesia Procedure Notes (Signed)
Procedure Name: Intubation Performed by: Lance Muss Pre-anesthesia Checklist: Emergency Drugs available, Patient identified, Suction available, Patient being monitored and Timeout performed Patient Re-evaluated:Patient Re-evaluated prior to inductionOxygen Delivery Method: Circle system utilized Preoxygenation: Pre-oxygenation with 100% oxygen Intubation Type: IV induction Ventilation: Mask ventilation without difficulty and Oral airway inserted - appropriate to patient size Laryngoscope Size: Glidescope and 3 Grade View: Grade II Tube type: Oral Number of attempts: 1 Airway Equipment and Method: Video-laryngoscopy Placement Confirmation: ETT inserted through vocal cords under direct vision,  positive ETCO2 and breath sounds checked- equal and bilateral Secured at: 23 cm Tube secured with: Tape Dental Injury: Teeth and Oropharynx as per pre-operative assessment

## 2015-04-10 NOTE — Anesthesia Preprocedure Evaluation (Signed)
Anesthesia Evaluation  Patient identified by MRN, date of birth, ID band Patient awake    Reviewed: Allergy & Precautions, H&P , NPO status , Patient's Chart, lab work & pertinent test results  History of Anesthesia Complications (+) PROLONGED EMERGENCE, Emergence Delirium and history of anesthetic complications  Airway Mallampati: III  TM Distance: >3 FB Neck ROM: full    Dental  (+) Poor Dentition, Chipped, Missing   Pulmonary former smoker,    Pulmonary exam normal breath sounds clear to auscultation       Cardiovascular Exercise Tolerance: Poor hypertension, (-) angina+ Peripheral Vascular Disease and + DOE  (-) Past MI Normal cardiovascular exam+ Valvular Problems/Murmurs AS  Rhythm:regular Rate:Normal     Neuro/Psych negative neurological ROS  negative psych ROS   GI/Hepatic negative GI ROS, Neg liver ROS,   Endo/Other  diabetes, Type 2  Renal/GU negative Renal ROS  negative genitourinary   Musculoskeletal  (+) Arthritis ,   Abdominal   Peds  Hematology negative hematology ROS (+)   Anesthesia Other Findings Past Medical History:   Diabetes mellitus without complication ([facility])                 Hypertension                                                 Arthritis                                                    High cholesterol                                             Peripheral vascular disease ([facility])                            Hyperlipidemia                                               Aortic stenosis                                             Past Surgical History:   APPENDECTOMY                                                  IRRIGATION AND DEBRIDEMENT FOOT                 Right 01/23/2015      Comment:Procedure: IRRIGATION AND DEBRIDEMENT FOOT;                Surgeon: Samara Deist, DPM;  Location: [facility]               [facility];  Service: Podiatry;  Laterality: Right;   PERIPHERAL VASCULAR  CATHETERIZATION             N/A 01/27/2015      Comment:Procedure: Abdominal Aortogram w/Lower               Extremity;  Surgeon: Katha Cabal, MD;                Location: [place] [facility];  Service:               Cardiovascular;  Laterality: N/A;   PERIPHERAL VASCULAR CATHETERIZATION              01/27/2015      Comment:Procedure: Lower Extremity Intervention;                Surgeon: Katha Cabal, MD;  Location: [place] [facility];  Service: Cardiovascular;;   AMPUTATION                                      Right 01/30/2015      Comment:Procedure: AMPUTATION FOOT/ TRANSMETATARSAL;                Surgeon: Samara Deist, DPM;  Location: [facility]               ORS;  Service: Podiatry;  Laterality: Right;  BMI    Body Mass Index   26.62 kg/m 2      Reproductive/Obstetrics negative OB ROS                             Anesthesia Physical Anesthesia Plan  ASA: IV  Anesthesia Plan: General ETT   Post-op Pain Management:    Induction:   Airway Management Planned:   Additional Equipment:   Intra-op Plan:   Post-operative Plan:   Informed Consent: I have reviewed the patients History and Physical, chart, labs and discussed the procedure including the risks, benefits and alternatives for the proposed anesthesia with the patient or authorized representative who has indicated his/her understanding and acceptance.   Dental Advisory Given  Plan Discussed with: Anesthesiologist, CRNA and Surgeon  Anesthesia Plan Comments: (Patient informed that they are higher risk for complications from anesthesia during this procedure due to their medical history.  Patient voiced understanding. )        Anesthesia Quick Evaluation

## 2015-04-11 LAB — GLUCOSE, CAPILLARY
GLUCOSE-CAPILLARY: 156 mg/dL — AB (ref 65–99)
GLUCOSE-CAPILLARY: 136 mg/dL — AB (ref 65–99)
Glucose-Capillary: 120 mg/dL — ABNORMAL HIGH (ref 65–99)
Glucose-Capillary: 193 mg/dL — ABNORMAL HIGH (ref 65–99)

## 2015-04-11 MED ORDER — ENSURE ENLIVE PO LIQD
237.0000 mL | Freq: Three times a day (TID) | ORAL | Status: DC
  Administered 2015-04-11 – 2015-04-15 (×10): 237 mL via ORAL

## 2015-04-11 NOTE — Progress Notes (Addendum)
Physical Therapy Evaluation Patient Details Name: Spencer [name] MRN: [id number] DOB: [birth year] Today's Date: 04/11/2015   History of Present Illness  Spencer [name] is a 82 y.o. male who presents with right leg gangrene. The patient is scheduled for a right below-the-knee amputation.  Clinical Impression  Pt presents with decreased functional mobility including bed mobility and transfers; decreased strength; and pain and would benefit from acute PT services to address objective findings.  Pt has been living at [facility] since Jan and prior to then he was in an independent living apartment.  Pt is motivated to get better, limited by drowsiness this session and is planning on getting a prothesis after stump heals.  Rec f/u PT services 5x/week.    Follow Up Recommendations SNF    Equipment Recommendations  Rolling walker with 5" wheels    Recommendations for Other Services       Precautions / Restrictions Precautions Precautions: Fall Restrictions Weight Bearing Restrictions: Yes RLE Weight Bearing: Non weight bearing Other Position/Activity Restrictions: BKA precautions      Mobility  Bed Mobility Overal bed mobility: Needs Assistance Bed Mobility: Supine to Sit;Sit to Supine     Supine to sit: Min assist Sit to supine: Min assist   General bed mobility comments: Min A for scooting to EOB, HOB elevated  Transfers Overall transfer level: Needs assistance Equipment used: Rolling walker (2 wheeled) Transfers: Sit to/from Stand Sit to Stand: Mod assist         General transfer comment: Bed elevate, assist for lift off from bed and to maintain balance when transfering L hand from bed to walker  Ambulation/Gait             General Gait Details: Unable this date; maintained standing balance for 1 min x 2 reps  Stairs            Wheelchair Mobility    Modified Rankin (Stroke Patients Only)       Balance Overall balance assessment:  Needs assistance Sitting-balance support: Feet supported;Single extremity supported Sitting balance-Leahy Scale: Good     Standing balance support: Bilateral upper extremity supported;During functional activity Standing balance-Leahy Scale: Fair                               Pertinent Vitals/Pain Pain Assessment: 0-10 Pain Location: R LE with standing Pain Descriptors / Indicators: Aching Pain Intervention(s): Limited activity within patient's tolerance;Monitored during session    Home Living Family/patient expects to be discharged to:: Skilled nursing facility                      Prior Function Level of Independence: Needs assistance   Gait / Transfers Assistance Needed: w/c  ADL's / Homemaking Assistance Needed: assist for bathing and dressing; eats meals in room  Comments: At Healthcare facility since Jan/2017, prior to then living in idependent apartment     Hand Dominance        Extremity/Trunk Assessment   Upper Extremity Assessment: Generalized weakness           Lower Extremity Assessment: Generalized weakness (Able to SLR bilaterally)         Communication   Communication: HOH  Cognition Arousal/Alertness: Awake/alert Behavior During Therapy: WFL for tasks assessed/performed Overall Cognitive Status: Within Functional Limits for tasks assessed  General Comments General comments (skin integrity, edema, etc.): R LE stump with surgical dressing intact    Exercises Other Exercises Other Exercises: desensitization to stump Other Exercises: AP, QS, SLR, and ABD/ADD R LE Other Exercises: Post BKA education and handout given.      Assessment/Plan    PT Assessment Patient needs continued PT services  PT Diagnosis Difficulty walking;Generalized weakness;Acute pain   PT Problem List Decreased strength;Decreased range of motion;Decreased activity tolerance;Decreased balance;Decreased mobility;Decreased  coordination;Decreased knowledge of use of DME;Pain  PT Treatment Interventions DME instruction;Gait training;Functional mobility training;Therapeutic activities;Therapeutic exercise;Balance training;Patient/family education   PT Goals (Current goals can be found in the Care Plan section) Acute Rehab PT Goals Patient Stated Goal: None stated. PT Goal Formulation: With patient/family Time For Goal Achievement: 04/25/15 Potential to Achieve Goals: Good    Frequency 7x/week  Barriers to discharge        Co-evaluation               End of Session Equipment Utilized During Treatment: Gait belt Activity Tolerance: Patient tolerated treatment well;Patient limited by pain;Patient limited by lethargy Patient left: in bed;with call bell/phone within reach;with bed alarm set;with family/visitor present Nurse Communication: Mobility status (IV bleeding)         Time: [id number] PT Time Calculation (min) (ACUTE ONLY): 38 min   Charges:   PT Evaluation $PT Eval Moderate Complexity: 1 Procedure PT Treatments $Therapeutic Exercise: 8-22 mins $Therapeutic Activity: 8-22 mins   PT G Codes:        Brandell Maready A Deaken Jurgens, PT 04/11/2015, 2:58 PM

## 2015-04-11 NOTE — Progress Notes (Signed)
[facility]  Daily Progress Note   Subjective  - 1 Day Post-Op  Pain well controlled with current narcotic regime tolerating by mouth  Objective Filed Vitals:   04/10/15 1957 04/10/15 2005 04/11/15 0407 04/11/15 1235  BP:  156/65 115/54 121/52  Pulse:  85 102 91  Temp:  98 F (36.7 C) 99.6 F (37.6 C) 98.3 F (36.8 C)  TempSrc:  Oral Oral Oral  Resp:  20 20 16   Height: 5\' 5"  (1.651 m)     Weight: 71.804 kg (158 lb 4.8 oz)     SpO2:  95% 97% 94%    Intake/Output Summary (Last 24 hours) at 04/11/15 1414 Last data filed at 04/11/15 0900  Gross per 24 hour  Intake    200 ml  Output     20 ml  Net    180 ml    PULM  Normal effort , no use of accessory muscles CV  No JVD, RRR Abd      No distended, nontender VASC  dressing clean dry and intact  Laboratory CBC    Component Value Date/Time   WBC 13.0* 04/06/2015 1434   HGB 11.2* 04/10/2015 1158   HCT 33.0* 04/10/2015 1158   PLT 375 04/06/2015 1434    BMET    Component Value Date/Time   NA 144 04/10/2015 1158   K 3.7 04/10/2015 1158   CL 107 04/06/2015 1434   CO2 21* 04/06/2015 1434   GLUCOSE 106* 04/10/2015 1158   BUN 14 04/06/2015 1434   CREATININE 0.66 04/06/2015 1434   CALCIUM 8.2* 04/06/2015 1434   GFRNONAA >60 04/06/2015 1434   GFRAA >60 04/06/2015 1434    Assessment/Planning: POD #1 s/p right below-knee amputation   We'll add ensure order has been placed plan for initial dressing change in 3-4 days continue parenteral and oral narcotics for pain control    Katha Cabal  04/11/2015, 2:14 PM

## 2015-04-11 NOTE — Progress Notes (Addendum)
[facility] [place] NAME: Spencer [name]    MR#:  [id number]  DATE OF BIRTH:  [birth year]  SUBJECTIVE:    Patient can hear better now that battery has been replaced for hearing aids  REVIEW OF SYSTEMS:    Review of Systems  Constitutional: Negative for fever, chills and malaise/fatigue.  HENT: Positive for hearing loss. Negative for ear discharge, ear pain, nosebleeds and sore throat.   Eyes: Negative for blurred vision and pain.  Respiratory: Negative for cough, hemoptysis, shortness of breath and wheezing.   Cardiovascular: Negative for chest pain, palpitations and leg swelling.  Gastrointestinal: Negative for nausea, vomiting, abdominal pain, diarrhea and blood in stool.  Genitourinary: Negative for dysuria.  Musculoskeletal: Negative for back pain.  Neurological: Negative for dizziness, tremors, speech change, focal weakness, seizures and headaches.  Endo/Heme/Allergies: Does not bruise/bleed easily.  Psychiatric/Behavioral: Negative for depression, suicidal ideas and hallucinations.    Tolerating Diet:yes      DRUG ALLERGIES:  No Known Allergies  VITALS:  Blood pressure 115/54, pulse 102, temperature 99.6 F (37.6 C), temperature source Oral, resp. rate 20, height 5\' 5"  (1.651 m), weight 71.804 kg (158 lb 4.8 oz), SpO2 97 %.  PHYSICAL EXAMINATION:   Physical Exam  Constitutional: He is well-developed, well-nourished, and in no distress. No distress.  HENT:  Head: Normocephalic.  Eyes: No scleral icterus.  Neck: Normal range of motion. Neck supple. No JVD present. No tracheal deviation present.  Cardiovascular: Normal rate, regular rhythm and normal heart sounds.  Exam reveals no gallop and no friction rub.   No murmur (4/6 holosystolic) heard. Pulmonary/Chest: Effort normal and breath sounds normal. No respiratory distress. He has no wheezes. He has no rales. He exhibits no tenderness.  Abdominal: Soft. Bowel  sounds are normal. He exhibits no distension and no mass. There is no tenderness. There is no rebound and no guarding.  Musculoskeletal: Normal range of motion. He exhibits no edema.  Right BKA  Neurological: He is alert.  Skin: Skin is warm. No rash noted. No erythema.  Psychiatric: Affect and judgment normal.      LABORATORY PANEL:   CBC  Recent Labs Lab 04/06/15 1434 04/10/15 1158  WBC 13.0*  --   HGB 8.8* 11.2*  HCT 30.0* 33.0*  PLT 375  --    ------------------------------------------------------------------------------------------------------------------  Chemistries   Recent Labs Lab 04/06/15 1434 04/10/15 1158  NA 136 144  K 3.2* 3.7  CL 107  --   CO2 21*  --   GLUCOSE 123* 106*  BUN 14  --   CREATININE 0.66  --   CALCIUM 8.2*  --    ------------------------------------------------------------------------------------------------------------------  Cardiac Enzymes No results for input(s): TROPONINI in the last 168 hours. ------------------------------------------------------------------------------------------------------------------  RADIOLOGY:  No results found.   ASSESSMENT AND PLAN:   82 year old male with peripheral vascular disease, essential hypertension, diabetes who had an elective right below knee amputation and postoperatively noted to be confused.  1. Confusion with altered mental status: Patient appears to be now at baseline. Postop confusion is due to anesthesia.  2. Peripheral vascular disease status post right knee low amputation: Management as per [facility] surgery.  3. Hyperlipidemia: Continue simvastatin.  4. Type 2 diabetes without complication: Continue sliding scale insulin and ADA diet Blood sugars are controlled.     Management plans discussed with the patient and daughter and he is in agreement.  CODE STATUS: FULL TOTAL TIME TAKING CARE OF THIS PATIENT: 24 minutes.  POSSIBLE D/C ??, DEPENDING ON CLINICAL  CONDITION.   Briell Paulette M.D on 04/11/2015 at 11:29 AM  Between 7am to 6pm - Pager - [phone] After 6pm go to [email or web address] - password [email or web address] [facility]  Office  [phone]  CC: Primary care physician; [name], MD  Note: This dictation was prepared with Dragon dictation along with smaller phrase technology. Any transcriptional errors that result from this process are unintentional.

## 2015-04-12 LAB — GLUCOSE, CAPILLARY
GLUCOSE-CAPILLARY: 167 mg/dL — AB (ref 65–99)
GLUCOSE-CAPILLARY: 158 mg/dL — AB (ref 65–99)
Glucose-Capillary: 146 mg/dL — ABNORMAL HIGH (ref 65–99)
Glucose-Capillary: 123 mg/dL — ABNORMAL HIGH (ref 65–99)

## 2015-04-12 MED ORDER — POLYETHYLENE GLYCOL 3350 17 G PO PACK: 17.0000 g | PACK | Freq: Every day | ORAL | Status: DC

## 2015-04-12 MED ORDER — POLYETHYLENE GLYCOL 3350 17 G PO PACK
17.0000 g | PACK | Freq: Every day | ORAL | Status: DC
  Administered 2015-04-12 – 2015-04-15 (×4): 17 g via ORAL
  Filled 2015-04-12 (×4): qty 1

## 2015-04-12 NOTE — Progress Notes (Signed)
[facility]  Daily Progress Note   Subjective  - 2 Day Post-Op  Pain well controlled with current narcotic regime taking some by mouth but even after 2 orders in the computer he is still not receiving his ensure supplements  Objective Filed Vitals:   04/11/15 1235 04/11/15 2022 04/12/15 0457 04/12/15 1240  BP: 121/52 132/51 136/49 113/55  Pulse: 91 85 92 94  Temp: 98.3 F (36.8 C) 98.1 F (36.7 C) 98 F (36.7 C) 98.8 F (37.1 C)  TempSrc: Oral Oral Oral Oral  Resp: 16 18 20 16   Height:      Weight:      SpO2: 94% 97% 96% 94%    Intake/Output Summary (Last 24 hours) at 04/12/15 1453 Last data filed at 04/12/15 1300  Gross per 24 hour  Intake    480 ml  Output    100 ml  Net    380 ml    PULM  Normal effort , no use of accessory muscles CV  No JVD, RRR Abd      No distended, nontender VASC  dressing clean dry and intact  Laboratory CBC    Component Value Date/Time   WBC 13.0* 04/06/2015 1434   HGB 11.2* 04/10/2015 1158   HCT 33.0* 04/10/2015 1158   PLT 375 04/06/2015 1434    BMET    Component Value Date/Time   NA 144 04/10/2015 1158   K 3.7 04/10/2015 1158   CL 107 04/06/2015 1434   CO2 21* 04/06/2015 1434   GLUCOSE 106* 04/10/2015 1158   BUN 14 04/06/2015 1434   CREATININE 0.66 04/06/2015 1434   CALCIUM 8.2* 04/06/2015 1434   GFRNONAA >60 04/06/2015 1434   GFRAA >60 04/06/2015 1434    Assessment/Planning: POD #2 s/p right below-knee amputation   Call has been placed the dietary so that ensure will be delivered with his meals.  plan for initial dressing change in 1-2 days   continue parenteral and oral narcotics for pain control    Katha Cabal  04/12/2015, 2:53 PM

## 2015-04-12 NOTE — Progress Notes (Signed)
Physical Therapy Treatment Patient Details Name: Casey Sadlowski MRN: [id number] DOB: 1917/05/25 Today's Date: 04/12/2015    History of Present Illness Larico Bloedorn is a 82 y.o. male who presents with right leg gangrene. The patient is scheduled for a right below-the-knee amputation.    PT Comments    In-bed exercises were performed during treatment today, due to patient's lethargy, with mobility tasks held until next treatment. Patient would at times briefly fall asleep during his exercises. Patient was able to move his RLE quite well in regards to SLR and hip abduction. Patient did not report any significant increase in pain during treatment. Patient may benefit from PT treatment in the morning due to his fatigue and lethargy this afternoon. He may benefit from continued PT while at [facility] to address strength, bed mobility and ambulation/transfers.   Follow Up Recommendations  SNF     Equipment Recommendations  Rolling walker with 5" wheels    Recommendations for Other Services       Precautions / Restrictions Precautions Precautions: Fall Restrictions Weight Bearing Restrictions: Yes RLE Weight Bearing: Non weight bearing    Mobility  Bed Mobility                  Transfers                    Ambulation/Gait                 Stairs            Wheelchair Mobility    Modified Rankin (Stroke Patients Only)       Balance                                    Cognition Arousal/Alertness: Lethargic (drowsy during PT treatment, at times closing eyes) Behavior During Therapy: WFL for tasks assessed/performed Overall Cognitive Status: Within Functional Limits for tasks assessed                      Exercises General Exercises - Lower Extremity Ankle Circles/Pumps: AROM;Left;10 reps Quad Sets: Strengthening;Both;10 reps Hip ABduction/ADduction: Strengthening;Both;Left;15 reps;Right;10 reps Straight Leg Raises:  Strengthening;Both;10 reps    General Comments        Pertinent Vitals/Pain Pain Assessment:  (reports "at times I have pain") Pain Intervention(s): Limited activity within patient's tolerance;Monitored during session    Home Living                      Prior Function            PT Goals (current goals can now be found in the care plan section) Progress towards PT goals: Progressing toward goals    Frequency  7X/week    PT Plan      Co-evaluation             End of Session   Activity Tolerance: Patient limited by lethargy Patient left: in bed;with bed alarm set;with family/visitor present     Time: [id number] PT Time Calculation (min) (ACUTE ONLY): 9 min  Charges:  $Therapeutic Exercise: 8-22 mins                    G Codes:      Bertram Denver, PT, DPT, CWCE 04/12/2015, 3:05 PM

## 2015-04-12 NOTE — Progress Notes (Signed)
[facility] [place] NAME: Spencer [name]    MR#:  [id number]  DATE OF BIRTH:  10-23-1917  SUBJECTIVE:    Patient doing well. No acute events overnight. Mental status is back to baseline.  REVIEW OF SYSTEMS:    Review of Systems  Constitutional: Negative for fever, chills and malaise/fatigue.  HENT: Positive for hearing loss. Negative for ear discharge, ear pain, nosebleeds and sore throat.   Eyes: Negative for blurred vision and pain.  Respiratory: Negative for cough, hemoptysis, shortness of breath and wheezing.   Cardiovascular: Negative for chest pain, palpitations and leg swelling.  Gastrointestinal: Positive for constipation. Negative for nausea, vomiting, abdominal pain, diarrhea and blood in stool.  Genitourinary: Negative for dysuria.  Musculoskeletal: Negative for back pain.  Neurological: Negative for dizziness, tremors, speech change, focal weakness, seizures and headaches.  Endo/Heme/Allergies: Does not bruise/bleed easily.  Psychiatric/Behavioral: Negative for depression, suicidal ideas and hallucinations.    Tolerating Diet:yes      DRUG ALLERGIES:  No Known Allergies  VITALS:  Blood pressure 136/49, pulse 92, temperature 98 F (36.7 C), temperature source Oral, resp. rate 20, height 5\' 5"  (1.651 m), weight 71.804 kg (158 lb 4.8 oz), SpO2 96 %.  PHYSICAL EXAMINATION:   Physical Exam  Constitutional: He is well-developed, well-nourished, and in no distress. No distress.  HENT:  Head: Normocephalic.  Eyes: No scleral icterus.  Neck: Normal range of motion. Neck supple. No JVD present. No tracheal deviation present.  Cardiovascular: Normal rate, regular rhythm and normal heart sounds.  Exam reveals no gallop and no friction rub.   No murmur (4/6 holosystolic) heard. Pulmonary/Chest: Effort normal and breath sounds normal. No respiratory distress. He has no wheezes. He has no rales. He exhibits no  tenderness.  Abdominal: Soft. Bowel sounds are normal. He exhibits no distension and no mass. There is no tenderness. There is no rebound and no guarding.  Musculoskeletal: Normal range of motion. He exhibits no edema.  Right BKA  Neurological: He is alert.  Skin: Skin is warm. No rash noted. No erythema.  Psychiatric: Affect and judgment normal.      LABORATORY PANEL:   CBC  Recent Labs Lab 04/06/15 1434 04/10/15 1158  WBC 13.0*  --   HGB 8.8* 11.2*  HCT 30.0* 33.0*  PLT 375  --    ------------------------------------------------------------------------------------------------------------------  Chemistries   Recent Labs Lab 04/06/15 1434 04/10/15 1158  NA 136 144  K 3.2* 3.7  CL 107  --   CO2 21*  --   GLUCOSE 123* 106*  BUN 14  --   CREATININE 0.66  --   CALCIUM 8.2*  --    ------------------------------------------------------------------------------------------------------------------  Cardiac Enzymes No results for input(s): TROPONINI in the last 168 hours. ------------------------------------------------------------------------------------------------------------------  RADIOLOGY:  No results found.   ASSESSMENT AND PLAN:   82 year old male with peripheral vascular disease, essential hypertension, diabetes who had an elective right below knee amputation and postoperatively noted to be confused.  1. Confusion with altered mental status: Patient appears to be now at baseline. Postop confusion was due to anesthesia.  2. Peripheral vascular disease status post right knee low amputation: Management as per vascular surgery.  3. Hyperlipidemia: Continue simvastatin.  4. Type 2 diabetes without complication: Continue sliding scale insulin and ADA diet Blood sugars are controlled.   5. Constipation: Order MiraLAX. I will sign off. Please call if you have further questions thank you for allowing me to participate in the care  of your  patient.   Management plans discussed with the patient  and he is in agreement.  CODE STATUS: FULL TOTAL TIME TAKING CARE OF THIS PATIENT: 22 minutes.     POSSIBLE D/C ??, DEPENDING ON CLINICAL CONDITION.   Spencer [name] M.D on 04/12/2015 at 10:15 AM  Between 7am to 6pm - Pager - [phone] After 6pm go to [email or web address] - password [email or web address] [facility]  Office  [phone]  CC: Primary care physician; Spencer Simpler, MD  Note: This dictation was prepared with Dragon dictation along with smaller phrase technology. Any transcriptional errors that result from this process are unintentional.

## 2015-04-13 LAB — GLUCOSE, CAPILLARY
GLUCOSE-CAPILLARY: 137 mg/dL — AB (ref 65–99)
GLUCOSE-CAPILLARY: 133 mg/dL — AB (ref 65–99)
GLUCOSE-CAPILLARY: 122 mg/dL — AB (ref 65–99)
GLUCOSE-CAPILLARY: 144 mg/dL — AB (ref 65–99)

## 2015-04-13 NOTE — Progress Notes (Signed)
Physical Therapy Treatment Patient Details Name: Spencer [name] MRN: [id number] DOB: 05/29/1917 Today's Date: 04/13/2015    History of Present Illness Spencer [name] is a 82 y.o. male who presents with right leg gangrene. The patient is scheduled for a right below-the-knee amputation.    PT Comments    Pt up in chair upon arrival; pt lethargic, but agreeable to PT. Pt participates in long sit exercises; however, needs consistent cueing both verbally and tactile to stay on task and awaken. Mild increase in pain Right lower extremity with movement/touch to assist movement.  Pt/daughter note pt not taking any strong medications and report inability to eat much due to lack of interest. Pt educated/encouraged to eat with suggestions for optimal energy/healing. Daughter offers outside food; pt disinterested. Pt remains comfortably in chair.   Follow Up Recommendations  SNF     Equipment Recommendations  Rolling walker with 5" wheels    Recommendations for Other Services       Precautions / Restrictions Precautions Precautions: Fall Restrictions Weight Bearing Restrictions: Yes RLE Weight Bearing: Non weight bearing    Mobility  Bed Mobility               General bed mobility comments: Not tested; already up in chair  Transfers                    Ambulation/Gait                 Stairs            Wheelchair Mobility    Modified Rankin (Stroke Patients Only)       Balance                                    Cognition Arousal/Alertness: Lethargic Behavior During Therapy: WFL for tasks assessed/performed Overall Cognitive Status: Within Functional Limits for tasks assessed                      Exercises General Exercises - Lower Extremity Ankle Circles/Pumps: AROM;Left;20 reps (long sit) Quad Sets: Strengthening;Both;15 reps (long sit) Gluteal Sets: Strengthening;Both;15 reps (long sit) Short Arc Quad: AROM;Right;10  reps (long sti; RROM L) Heel Slides: Left;15 reps;Strengthening;AROM (long sit; resisted extension) Hip ABduction/ADduction: AROM;Right;10 reps (long sit; RROM L 15 reps) Other Exercises Other Exercises: Pt education on eating for optimal healing/energy levels    General Comments        Pertinent Vitals/Pain Pain Assessment: 0-10 Pain Score: 5  Pain Location: RLE Pain Intervention(s): Limited activity within patient's tolerance;Monitored during session;Premedicated before session    Home Living                      Prior Function            PT Goals (current goals can now be found in the care plan section) Progress towards PT goals: Progressing toward goals (slowly)    Frequency  7X/week    PT Plan Current plan remains appropriate    Co-evaluation             End of Session   Activity Tolerance: Patient limited by lethargy Patient left: in chair;with call bell/phone within reach;with chair alarm set;with family/visitor present     Time: [id number] PT Time Calculation (min) (ACUTE ONLY): 24 min  Charges:  $Therapeutic Exercise: 23-37 mins  G CodesCharlaine Dalton, PTA 04/13/2015, 12:09 PM

## 2015-04-13 NOTE — Progress Notes (Signed)
[facility]  Daily Progress Note   Subjective  - 3 Day Post-Op  Pain well controlled patient has used just to oxycodone   Objective Filed Vitals:   04/12/15 2137 04/13/15 0503 04/13/15 1122 04/13/15 1500  BP: 134/53 151/65  148/63  Pulse: 89 90 95 91  Temp: 97.3 F (36.3 C) 98.5 F (36.9 C)  98.4 F (36.9 C)  TempSrc: Oral Oral  Oral  Resp: 16 16  18   Height:      Weight:      SpO2: 96% 99% 95% 96%    Intake/Output Summary (Last 24 hours) at 04/13/15 1944 Last data filed at 04/13/15 1700  Gross per 24 hour  Intake    320 ml  Output    425 ml  Net   -105 ml    PULM  Normal effort , no use of accessory muscles CV  No JVD, RRR Abd      No distended, nontender VASC  dressing removed staple line intact some ecchymoses over the tibial tuberosity is noted  Laboratory CBC    Component Value Date/Time   WBC 13.0* 04/06/2015 1434   HGB 11.2* 04/10/2015 1158   HCT 33.0* 04/10/2015 1158   PLT 375 04/06/2015 1434    BMET    Component Value Date/Time   NA 144 04/10/2015 1158   K 3.7 04/10/2015 1158   CL 107 04/06/2015 1434   CO2 21* 04/06/2015 1434   GLUCOSE 106* 04/10/2015 1158   BUN 14 04/06/2015 1434   CREATININE 0.66 04/06/2015 1434   CALCIUM 8.2* 04/06/2015 1434   GFRNONAA >60 04/06/2015 1434   GFRAA >60 04/06/2015 1434    Assessment/Planning: POD #3 s/p right below-knee amputation  Patient tolerated his first dressing change quite well staple line is clean dry and intact he is sitting up in a chair in all respects she is doing well I will continue to follow his amputation site. Hopefully he will be discharged later this week Katha Cabal  04/13/2015, 7:44 PM

## 2015-04-13 NOTE — Progress Notes (Signed)
During assessment pt noted to have significant positive JVD, irregular HR, and murmor.  Dr. Benjie Karvonen notified and was aware, with no new orders. Will continue to monitor. Haynes Hoehn RN.

## 2015-04-13 NOTE — Care Management Important Message (Signed)
Important Message  Patient Details  Name: Spencer [name] MRN: [id number] Date of Birth: May 02, 1917   Medicare Important Message Given:  Yes    Juliann Pulse A Caniya Tagle 04/13/2015, 11:13 AM

## 2015-04-14 LAB — GLUCOSE, CAPILLARY
GLUCOSE-CAPILLARY: 140 mg/dL — AB (ref 65–99)
GLUCOSE-CAPILLARY: 144 mg/dL — AB (ref 65–99)
Glucose-Capillary: 215 mg/dL — ABNORMAL HIGH (ref 65–99)
Glucose-Capillary: 120 mg/dL — ABNORMAL HIGH (ref 65–99)

## 2015-04-14 LAB — SURGICAL PATHOLOGY

## 2015-04-14 NOTE — Progress Notes (Signed)
[facility]  Daily Progress Note   Subjective  - 4 Day Post-Op  Pain well controlled patient sitting up in a chair quite comfortable  Objective Filed Vitals:   04/14/15 0521 04/14/15 1303 04/14/15 1407 04/14/15 1526  BP: 140/48 146/54    Pulse: 98 103 101   Temp: 98.6 F (37 C) 98.1 F (36.7 C)    TempSrc: Oral Oral    Resp: 20 18    Height:      Weight:      SpO2: 93% 98% 97% 95%    Intake/Output Summary (Last 24 hours) at 04/14/15 1820 Last data filed at 04/14/15 0900  Gross per 24 hour  Intake    530 ml  Output    400 ml  Net    130 ml    PULM  Normal effort , no use of accessory muscles CV  No JVD, RRR Abd      No distended, nontender VASC  dressing removed staple line intact; the previously noted ecchymoses over the tibial tuberosity is nearly resolved overall the stump looks excellent  Laboratory CBC    Component Value Date/Time   WBC 13.0* 04/06/2015 1434   HGB 11.2* 04/10/2015 1158   HCT 33.0* 04/10/2015 1158   PLT 375 04/06/2015 1434    BMET    Component Value Date/Time   NA 144 04/10/2015 1158   K 3.7 04/10/2015 1158   CL 107 04/06/2015 1434   CO2 21* 04/06/2015 1434   GLUCOSE 106* 04/10/2015 1158   BUN 14 04/06/2015 1434   CREATININE 0.66 04/06/2015 1434   CALCIUM 8.2* 04/06/2015 1434   GFRNONAA >60 04/06/2015 1434   GFRAA >60 04/06/2015 1434    Assessment/Planning: POD #4 s/p right below-knee amputation  He has done well with therapy will plan for discharge to [facility] tomorrow   Schnier, Dolores Lory  04/14/2015, 6:20 PM

## 2015-04-14 NOTE — Anesthesia Postprocedure Evaluation (Signed)
Anesthesia Post Note  Patient: Spencer [name]  Procedure(s) Performed: Procedure(s) (LRB): AMPUTATION BELOW KNEE (Right)  Patient location during evaluation: PACU Anesthesia Type: General Level of consciousness: awake and confused Pain management: pain level controlled Vital Signs Assessment: post-procedure vital signs reviewed and stable Respiratory status: spontaneous breathing Cardiovascular status: blood pressure returned to baseline Anesthetic complications: no Comments: Some post op delirium resolving in PACU    Last Vitals:  Filed Vitals:   04/14/15 1303 04/14/15 1407  BP: 146/54   Pulse: 103 101  Temp: 36.7 C   Resp: 18     Last Pain:  Filed Vitals:   04/14/15 1526  PainSc: 0-No pain                 Emmalynne Courtney

## 2015-04-14 NOTE — Clinical Social Work Note (Signed)
Clinical Social Work Assessment  Patient Details  Name: Spencer [name] MRN: [id number] Date of Birth: 01-15-1917  Date of referral:  04/14/15               Reason for consult:  Facility Placement                Permission sought to share information with:   (patient sleeping and very hard of hearing) Permission granted to share information::     Name::        Agency::     Relationship::     Contact Information:     Housing/Transportation Living arrangements for the past 2 months:  [place] of Information:  Adult Children Patient Interpreter Needed:  None Criminal Activity/Legal Involvement Pertinent to Current Situation/Hospitalization:  No - Comment as needed Significant Relationships:  Adult Children Lives with:  Facility Resident Do you feel safe going back to the place where you live?  Yes Need for family participation in patient care:  Yes (Comment)  Care giving concerns:  Patient resides at [facility] long term care.   Social Worker assessment / plan:  CSW entered patient's room and patient was sleeping and is very hard of hearing. Patient's daughter, Kathlee Nations, and patient's brother were also in patient's room. CSW spoke with patient's daughter and patient did not awaken throughout conversation. Kathlee Nations verified that her father is a resident at [facility] and converted to long term care in January. Kathlee Nations states she wishes for her father to return there at discharge. She has no concerns or questions at this time.  Employment status:  Retired Forensic scientist:  Medicare PT Recommendations:  Not assessed at this time Information / Referral to community resources:     Patient/Family's Response to care:  Patient's daughter expressed appreciation for CSW assistance.  Patient/Family's Understanding of and Emotional Response to Diagnosis, Current Treatment, and Prognosis:  Patient's daughter expressed desire to return to [facility].  Emotional  Assessment Appearance:  Appears stated age Attitude/Demeanor/Rapport:   (currently sleeping) Affect (typically observed):  Unable to Assess Orientation:   (currently sleeping) Alcohol / Substance use:  Not Applicable Psych involvement (Current and /or in the community):  No (Comment)  Discharge Needs  Concerns to be addressed:  Care Coordination Readmission within the last 30 days:  No Current discharge risk:  None Barriers to Discharge:  No Barriers Identified   Shela Leff, LCSW 04/14/2015, 11:09 AM

## 2015-04-14 NOTE — NC FL2 (Signed)
[place] LEVEL OF CARE SCREENING TOOL     IDENTIFICATION  Patient Name: Spencer [name] Birthdate: 1917-12-15 Sex: male Admission Date (Current Location): 04/10/2015  [place] and [place] Number:  Engineering geologist and Address:  [facility], [address] [place], [place] [address]      Provider Number: [id number]  Attending Physician Name and Address:  Katha Cabal, MD  Relative Name and Phone Number:       Current Level of Care: Hospital Recommended Level of Care: [place] Prior Approval Number:    Date Approved/Denied:   PASRR Number:    Discharge Plan: SNF    Current Diagnoses: Patient Active Problem List   Diagnosis Date Noted  . Atherosclerosis of lower extremity with gangrene ([place]) 04/10/2015  . Cellulitis 01/21/2015    Orientation RESPIRATION BLADDER Height & Weight     Self, Place  O2 (1.5 [place] O2) Incontinent Weight: 158 lb 4.8 oz (71.804 kg) Height:  5\' 5"  (165.1 cm)  BEHAVIORAL SYMPTOMS/MOOD NEUROLOGICAL BOWEL NUTRITION STATUS   (none)  (none) Incontinent Diet (carb modified)  AMBULATORY STATUS COMMUNICATION OF NEEDS Skin   Extensive Assist Verbally Surgical wounds                       Personal Care Assistance Level of Assistance  Bathing, Dressing Bathing Assistance: Limited assistance   Dressing Assistance: Limited assistance     Functional Limitations Info  Hearing   Hearing Info: Impaired      SPECIAL CARE FACTORS FREQUENCY                       Contractures Contractures Info: Not present    Additional Factors Info  Code Status, Allergies Code Status Info: DNR Allergies Info: NKA           Current Medications (04/14/2015):  This is the current hospital active medication list Current Facility-Administered Medications  Medication Dose Route Frequency Provider Last Rate Last Dose  . acetaminophen (TYLENOL) tablet 325-650 mg  325-650 mg Oral Q4H PRN  Katha Cabal, MD       Or  . acetaminophen (TYLENOL) suppository 325-650 mg  325-650 mg Rectal Q4H PRN Katha Cabal, MD      . alum & mag hydroxide-simeth (MAALOX/MYLANTA) 200-200-20 MG/5ML suspension 15-30 mL  15-30 mL Oral Q2H PRN Katha Cabal, MD      . aspirin EC tablet 81 mg  81 mg Oral QODAY Katha Cabal, MD   81 mg at 04/14/15 1008  . docusate sodium (COLACE) capsule 100 mg  100 mg Oral Daily Katha Cabal, MD   100 mg at 04/14/15 1008  . DULoxetine (CYMBALTA) DR capsule 20 mg  20 mg Oral Daily Katha Cabal, MD   20 mg at 04/14/15 1007  . feeding supplement (ENSURE ENLIVE) (ENSURE ENLIVE) liquid 237 mL  237 mL Oral TID BM Katha Cabal, MD   237 mL at 04/14/15 1000  . furosemide (LASIX) tablet 40 mg  40 mg Oral Daily Katha Cabal, MD   40 mg at 04/14/15 1008  . guaiFENesin-dextromethorphan (ROBITUSSIN DM) 100-10 MG/5ML syrup 15 mL  15 mL Oral Q4H PRN Katha Cabal, MD      . hydrALAZINE (APRESOLINE) injection 5 mg  5 mg Intravenous Q20 Min PRN Katha Cabal, MD      . HYDROmorphone (DILAUDID) injection 0.5-1 mg  0.5-1 mg Intravenous Q2H  PRN Katha Cabal, MD   0.5 mg at 04/10/15 1904  . insulin aspart (novoLOG) injection 0-5 Units  0-5 Units Subcutaneous QHS Henreitta Leber, MD   0 Units at 04/10/15 2200  . insulin aspart (novoLOG) injection 0-9 Units  0-9 Units Subcutaneous TID WC Henreitta Leber, MD   1 Units at 04/14/15 [phone]  . labetalol (NORMODYNE,TRANDATE) injection 10 mg  10 mg Intravenous Q10 min PRN Katha Cabal, MD      . metoprolol (LOPRESSOR) injection 2-5 mg  2-5 mg Intravenous Q2H PRN Katha Cabal, MD      . ondansetron [facility]) injection 4 mg  4 mg Intravenous Q6H PRN Katha Cabal, MD      . oxyCODONE-acetaminophen (PERCOCET/ROXICET) 5-325 MG per tablet 1-2 tablet  1-2 tablet Oral Q4H PRN Katha Cabal, MD   1 tablet at 04/13/15 1218  . pantoprazole (PROTONIX) EC tablet 40 mg  40 mg Oral Daily Katha Cabal, MD   40 mg at 04/14/15 1008  . phenol (CHLORASEPTIC) mouth spray 1 spray  1 spray Mouth/Throat PRN Katha Cabal, MD      . polyethylene glycol (MIRALAX / GLYCOLAX) packet 17 g  17 g Oral Daily Bettey Costa, MD   17 g at 04/14/15 1007  . senna-docusate (Senokot-S) tablet 2 tablet  2 tablet Oral Daily Katha Cabal, MD   2 tablet at 04/14/15 1007  . simvastatin (ZOCOR) tablet 20 mg  20 mg Oral QHS Katha Cabal, MD   20 mg at 04/13/15 2241  . sodium phosphate (FLEET) 7-19 GM/118ML enema 1 enema  1 enema Rectal Once PRN Katha Cabal, MD      . sorbitol 70 % solution 30 mL  30 mL Oral Daily PRN Katha Cabal, MD         Discharge Medications: Please see discharge summary for a list of discharge medications.  Relevant Imaging Results:  Relevant Lab Results:   Additional Information    Shela Leff, LCSW

## 2015-04-14 NOTE — Progress Notes (Signed)
Physical Therapy Treatment Patient Details Name: Spencer [name] MRN: [id number] DOB: 17-Jul-1917 Today's Date: 04/14/2015    History of Present Illness Spencer [name] is a 82 y.o. male who presents with right leg gangrene. The patient is scheduled for a right below-the-knee amputation.    PT Comments    Pt initially refusing therapy stating, "I just don't want to". Gentle encouragement and explanation of importance and pt agreeable to short session of lower extremity exercises in the chair. Pt participates well requiring some mild increased instruction/tactile cueing initially, but ultimately performs exercises well demonstrating improved range and strength with Bilateral lower extremities. Pt noted to have Right lower extremity in contracted position in chair upon arrival. Pt/daughter educated on importance of maintaining right knee extension. Small blanket roll placed under residual limb for right knee extension. Daughter educated to remove roll after an hour and the longest and alternate with periods of leg rested on pillow to avoid any skin breakdown. Continue PT to progress active range of motion Right lower extremity, strength Bilateral lower extremities to improve functional mobility.   Follow Up Recommendations  SNF     Equipment Recommendations  Rolling walker with 5" wheels    Recommendations for Other Services       Precautions / Restrictions Precautions Precautions: Fall Restrictions Weight Bearing Restrictions: Yes RLE Weight Bearing: Non weight bearing    Mobility  Bed Mobility               General bed mobility comments: Demonstrates greater active participation in RLE active movement  Transfers                    Ambulation/Gait                 Stairs            Wheelchair Mobility    Modified Rankin (Stroke Patients Only)       Balance                                    Cognition Arousal/Alertness:  Lethargic Behavior During Therapy: WFL for tasks assessed/performed Overall Cognitive Status: Within Functional Limits for tasks assessed                      Exercises General Exercises - Lower Extremity Ankle Circles/Pumps: AROM;Left;20 reps Quad Sets: Strengthening;Both;10 reps Gluteal Sets: Strengthening;Both;10 reps Short Arc Quad: AROM;Both;10 reps Heel Slides: AROM;Right;10 reps (L AROM 10x with resisted extension) Hip ABduction/ADduction: AROM;Both;10 reps Straight Leg Raises: AROM;Both;10 reps Other Exercises Other Exercises: Pt/daughter educated on resting postition of RLE with knee extended to avoid contracture. Place blanket roll under residual limb. Instructed daughter to remove after an hour at most alternating with/without to avoid skin breakdown    General Comments        Pertinent Vitals/Pain Pain Assessment: No/denies pain    Home Living                      Prior Function            PT Goals (current goals can now be found in the care plan section) Progress towards PT goals: Progressing toward goals (slowly)    Frequency  7X/week    PT Plan Current plan remains appropriate    Co-evaluation             End of Session  Activity Tolerance: Patient tolerated treatment well;Patient limited by lethargy Patient left: in chair;with call bell/phone within reach;with chair alarm set;with family/visitor present     Time: [id number] PT Time Calculation (min) (ACUTE ONLY): 19 min  Charges:  $Therapeutic Exercise: 8-22 mins                    G Codes:      Charlaine Dalton, PTA 04/14/2015, 2:38 PM

## 2015-04-15 LAB — GLUCOSE, CAPILLARY
Glucose-Capillary: 149 mg/dL — ABNORMAL HIGH (ref 65–99)
Glucose-Capillary: 188 mg/dL — ABNORMAL HIGH (ref 65–99)
Glucose-Capillary: 108 mg/dL — ABNORMAL HIGH (ref 65–99)

## 2015-04-15 NOTE — Discharge Summary (Signed)
[name]    Discharge Summary    Patient ID:  Spencer [name] MRN: [id number] DOB/AGE: 1917/08/12 82 y.o.  Admit date: 04/10/2015 Discharge date: 04/15/2015 Date of Surgery: 04/10/2015 Surgeon: Surgeon(s): Katha Cabal, MD  Admission Diagnosis: ASO W ULCERATION  Discharge Diagnoses:  ASO W ULCERATION  Secondary Diagnoses: Past Medical History  Diagnosis Date  . Diabetes mellitus without complication ([place])   . Hypertension   . Arthritis   . High cholesterol   . Peripheral vascular disease ([place])   . Hyperlipidemia   . Aortic stenosis   . Atrial fibrillation ([facility])     Procedure(s): AMPUTATION BELOW KNEE Right leg  Discharged Condition: stable  HPI:  The patient is a 82 year old gentleman who presented to the office for evaluation at the request of Dr. Vickki Muff. The patient has been struggling with a large ulceration of the right forefoot. Osteomyelitis is involved in this process has been nonhealing. After a very intense attempt at salvaging his foot it has become apparent that even under the best of circumstances this wound will leave him with a foot that would be nonweightbearing. Therefore the patient has elected to undergo below the knee amputation. After review of the patient's wound as well as discussion with Dr. Vickki Muff and lengthy discussion with the family I certainly concur with this plan as any further prolonged attempts at wound closure in the current state would come at a high risk for infectious complications and sepsis as well as potentially death.  On the day of admission the patient underwent successful right below-knee amputation under general anesthesia. Postoperative course has been normal without incident. He has excellent pain control and is required minimal narcotics. His first several dressing changes have been without difficulty and his suture line is clean and intact and appears to be healing well. Therefore on postoperative  day #5 he is fit for discharge.  Hospital Course:  Spencer [name] is a 82 y.o. male is S/P Right Procedure(s): AMPUTATION BELOW KNEE Extubated: POD # 0 Physical exam: Right below-knee amputation site healing well Post-op wounds healing well Pt. Ambulating, voiding and taking PO diet without difficulty. Pt pain controlled with PO pain meds. Labs as below Complications:none  Consults:  Treatment Team:  Bettey Costa, MD  Significant Diagnostic Studies: CBC Lab Results  Component Value Date   WBC 13.0* 04/06/2015   HGB 11.2* 04/10/2015   HCT 33.0* 04/10/2015   MCV 66.1* 04/06/2015   PLT 375 04/06/2015    BMET    Component Value Date/Time   NA 144 04/10/2015 1158   K 3.7 04/10/2015 1158   CL 107 04/06/2015 1434   CO2 21* 04/06/2015 1434   GLUCOSE 106* 04/10/2015 1158   BUN 14 04/06/2015 1434   CREATININE 0.66 04/06/2015 1434   CALCIUM 8.2* 04/06/2015 1434   GFRNONAA >60 04/06/2015 1434   GFRAA >60 04/06/2015 1434   COAG Lab Results  Component Value Date   INR 1.65 04/06/2015   INR 1.59 01/24/2015     Disposition:  Discharge to :Skilled nursing facility    Medication List    ASK your doctor about these medications        aspirin EC 81 MG tablet  Take 81 mg by mouth every other day.     cephALEXin 500 MG capsule  Commonly known as:  KEFLEX  Take 500 mg by mouth 2 (two) times daily. Reported on 04/10/2015     DULoxetine 20 MG capsule  Commonly known as:  CYMBALTA  Take 20 mg by mouth daily.     ELIQUIS 5 MG Tabs tablet  Generic drug:  apixaban  Take 5 mg by mouth 2 (two) times daily.     furosemide 40 MG tablet  Commonly known as:  LASIX  Take 40 mg by mouth daily as needed. Reported on 01/23/2015     HYDROcodone-acetaminophen 5-325 MG tablet  Commonly known as:  NORCO/VICODIN  Take 1 tablet by mouth every 6 (six) hours as needed for moderate pain.     metFORMIN 500 MG 24 hr tablet  Commonly known as:  GLUCOPHAGE-XR  Take 500 mg by mouth daily.      PROBIOTIC DAILY PO  Take 1 capsule by mouth at bedtime.     sennosides-docusate sodium 8.6-50 MG tablet  Commonly known as:  SENOKOT-S  Take 2 tablets by mouth daily.     simvastatin 20 MG tablet  Commonly known as:  ZOCOR  Take 20 mg by mouth at bedtime.       Verbal and written Discharge instructions given to the patient. Wound care per Discharge AVS   Signed: Katha Cabal, MD  04/15/2015, 3:58 PM

## 2015-04-15 NOTE — Progress Notes (Signed)
Physical Therapy Treatment Patient Details Name: Spencer [name] MRN: [id number] DOB: 1917/01/17 Today's Date: 04/15/2015    History of Present Illness Spencer [name] is a 82 y.o. male who presents with right leg gangrene. The patient is scheduled for a right below-the-knee amputation.    PT Comments    Pt more alert today, up in chair and carries on appropriate/present conversation. Pt participates well in seated exercises with several new lower extremity and core strength exercises initiated. Increased time spent on sit to/from stand transfer from chair. Pt with difficulty attaining full upright position and requires increased cueing for hand placement, safe use of rolling walker and strength required through core/left lower extremity. Pt does attain upright position 1 time out of several attempts, but unable to maintain without support and only for approximately 10 seconds. Pt notes feeling very weak. It was observed that pt once again had not touch breakfast tray other than milk and coffee. Re educated pt regarding nutritional needs for healing and strength. Pt states, "what they bring does not appeal to me, and it is the same thing every time" Ultimately seek options pt would prefer for both breakfast and lunch to increase carbohydrate and protein intake; orders placed with dietary on pt's behalf. Nursing called to re bandage pt's Right lower extremity, as the bandage had fallen off during session. Discussion with nursing as well regarding pt's transfers, strength and nutrition. Later post treatment, ran into pt's daughter and shared information with her regarding today's session. Continue PT to progress strength and transfers for improved functional mobility.   Follow Up Recommendations  SNF     Equipment Recommendations  Rolling walker with 5" wheels    Recommendations for Other Services       Precautions / Restrictions Precautions Precautions: Fall Restrictions Weight Bearing  Restrictions: Yes RLE Weight Bearing: Non weight bearing    Mobility  Bed Mobility               General bed mobility comments: Not tested, up in chair  Transfers Overall transfer level: Needs assistance Equipment used: Rolling walker (2 wheeled) Transfers: Sit to/from Stand Sit to Stand: Mod assist         General transfer comment: From recliner, pt requires verbal/tactile cues for safe hands. Several attempts before attaining mostly upright at rw. Unable to maintain without support and only does so for several seconds  Ambulation/Gait             General Gait Details: Unable    Science writer    Modified Rankin (Stroke Patients Only)       Balance Overall balance assessment: Needs assistance         Standing balance support: Bilateral upper extremity supported Standing balance-Leahy Scale: Poor                      Cognition Arousal/Alertness: Awake/alert Behavior During Therapy: WFL for tasks assessed/performed Overall Cognitive Status: Within Functional Limits for tasks assessed                      Exercises General Exercises - Lower Extremity Ankle Circles/Pumps: AROM;Left;20 reps Quad Sets: Strengthening;Both;20 reps (long sit) Gluteal Sets: Strengthening;Both;20 reps (long sit) Long Arc Quad: Strengthening;Both;20 reps;Seated Heel Slides: AROM;Right;20 reps (L with resisted extension) Hip ABduction/ADduction: AROM;Both;20 reps (long sit, RROM on L) Straight Leg Raises: AROM;Both;20 reps (long sit) Other Exercises Other Exercises: L  resisted ham curl 20x L Other Exercises: Trunk flexion 10x  Other Exercises: recliner chair push ups 10x Other Exercises: nutritional education and assist with ordering another breakfast and lunch    General Comments        Pertinent Vitals/Pain Pain Assessment: No/denies pain    Home Living                      Prior Function            PT  Goals (current goals can now be found in the care plan section) Progress towards PT goals: Progressing toward goals (slowly)    Frequency  7X/week    PT Plan Current plan remains appropriate    Co-evaluation             End of Session Equipment Utilized During Treatment: Gait belt Activity Tolerance: Patient tolerated treatment well (limited by weakness, poor nutritional intake) Patient left: in chair;with call bell/phone within reach;with chair alarm set     Time: 0926-1010 PT Time Calculation (min) (ACUTE ONLY): 44 min  Charges:  $Therapeutic Exercise: 23-37 mins $Therapeutic Activity: 8-22 mins                    G CodesCharlaine Dalton, PTA 04/15/2015, 12:37 PM

## 2015-04-15 NOTE — Clinical Social Work Note (Signed)
Dr. Delana Meyer has called CSW to state that he is going to discharge patient to return to [facility]. Seth Bake at [facility] is aware and patient's son is aware and requests transport via EMS. CSW awaiting discharge summary and script.  Shela Leff MSW,LCSW [phone]

## 2015-04-15 NOTE — Care Management Important Message (Signed)
Important Message  Patient Details  Name: Spencer [name] MRN: [id number] Date of Birth: 09-Jul-1917   Medicare Important Message Given:  Yes    Juliann Pulse A Kaleia Longhi 04/15/2015, 11:05 AM

## 2015-04-15 NOTE — Progress Notes (Signed)
04/15/2015 19:00  [name] to be D/C'd Skilled nursing facility per MD order.  Report called to receiving nurse at [facility] your doctor about these medications        aspirin EC 81 MG tablet  Take 81 mg by mouth every other day.     cephALEXin 500 MG capsule  Commonly known as:  KEFLEX  Take 500 mg by mouth 2 (two) times daily. Reported on 04/10/2015     DULoxetine 20 MG capsule  Commonly known as:  CYMBALTA  Take 20 mg by mouth daily.     ELIQUIS 5 MG Tabs tablet  Generic drug:  apixaban  Take 5 mg by mouth 2 (two) times daily.     furosemide 40 MG tablet  Commonly known as:  LASIX  Take 40 mg by mouth daily as needed. Reported on 01/23/2015     HYDROcodone-acetaminophen 5-325 MG tablet  Commonly known as:  NORCO/VICODIN  Take 1 tablet by mouth every 6 (six) hours as needed for moderate pain.     metFORMIN 500 MG 24 hr tablet  Commonly known as:  GLUCOPHAGE-XR  Take 500 mg by mouth daily.     PROBIOTIC DAILY PO  Take 1 capsule by mouth at bedtime.     sennosides-docusate sodium 8.6-50 MG tablet  Commonly known as:  SENOKOT-S  Take 2 tablets by mouth daily.     simvastatin 20 MG tablet  Commonly known as:  ZOCOR  Take 20 mg by mouth at bedtime.        Filed Vitals:   04/15/15 0926 04/15/15 1359  BP:  128/47  Pulse: 93 82  Temp:  98.5 F (36.9 C)  Resp:  18    Skin clean, dry and intact without evidence of skin break down, no evidence of skin tears noted. IV catheter discontinued intact. Site without signs and symptoms of complications. Dressing and pressure applied. Pt denies pain at this time. No complaints noted.  Paper work sent with EMS to SNF. Patient escorted via EMS and d/c to [facility] via EMS  Dola Arg[place]

## 2015-04-16 DIAGNOSIS — E440 Moderate protein-calorie malnutrition: Secondary | ICD-10-CM | POA: Diagnosis not present

## 2015-04-16 DIAGNOSIS — I739 Peripheral vascular disease, unspecified: Secondary | ICD-10-CM | POA: Diagnosis not present

## 2015-04-16 DIAGNOSIS — E1142 Type 2 diabetes mellitus with diabetic polyneuropathy: Secondary | ICD-10-CM | POA: Diagnosis not present

## 2015-04-16 DIAGNOSIS — Z89511 Acquired absence of right leg below knee: Secondary | ICD-10-CM

## 2015-04-16 DIAGNOSIS — I480 Paroxysmal atrial fibrillation: Secondary | ICD-10-CM | POA: Diagnosis not present

## 2015-04-16 DIAGNOSIS — I358 Other nonrheumatic aortic valve disorders: Secondary | ICD-10-CM

## 2015-04-28 DIAGNOSIS — J189 Pneumonia, unspecified organism: Secondary | ICD-10-CM | POA: Diagnosis not present

## 2015-06-08 ENCOUNTER — Inpatient Hospital Stay (HOSPITAL_COMMUNITY): Payer: Medicare Other

## 2015-06-08 ENCOUNTER — Emergency Department (HOSPITAL_COMMUNITY): Payer: Medicare Other

## 2015-06-08 ENCOUNTER — Encounter (HOSPITAL_COMMUNITY): Payer: Self-pay | Admitting: *Deleted

## 2015-06-08 ENCOUNTER — Inpatient Hospital Stay (HOSPITAL_COMMUNITY)
Admission: EM | Admit: 2015-06-08 | Discharge: 2015-06-11 | DRG: 092 | Disposition: A | Discharge status: Skilled Nursing Facility | Payer: Medicare Other | Attending: Internal Medicine | Admitting: Internal Medicine

## 2015-06-08 DIAGNOSIS — I480 Paroxysmal atrial fibrillation: Secondary | ICD-10-CM | POA: Diagnosis present

## 2015-06-08 DIAGNOSIS — Z7984 Long term (current) use of oral hypoglycemic drugs: Secondary | ICD-10-CM | POA: Diagnosis not present

## 2015-06-08 DIAGNOSIS — F329 Major depressive disorder, single episode, unspecified: Secondary | ICD-10-CM | POA: Diagnosis present

## 2015-06-08 DIAGNOSIS — Z7901 Long term (current) use of anticoagulants: Secondary | ICD-10-CM

## 2015-06-08 DIAGNOSIS — M1990 Unspecified osteoarthritis, unspecified site: Secondary | ICD-10-CM | POA: Diagnosis present

## 2015-06-08 DIAGNOSIS — K5900 Constipation, unspecified: Secondary | ICD-10-CM | POA: Diagnosis present

## 2015-06-08 DIAGNOSIS — E11622 Type 2 diabetes mellitus with other skin ulcer: Secondary | ICD-10-CM | POA: Diagnosis present

## 2015-06-08 DIAGNOSIS — Z87891 Personal history of nicotine dependence: Secondary | ICD-10-CM

## 2015-06-08 DIAGNOSIS — H9190 Unspecified hearing loss, unspecified ear: Secondary | ICD-10-CM | POA: Diagnosis present

## 2015-06-08 DIAGNOSIS — S0083XA Contusion of other part of head, initial encounter: Secondary | ICD-10-CM | POA: Diagnosis present

## 2015-06-08 DIAGNOSIS — R29810 Facial weakness: Secondary | ICD-10-CM | POA: Diagnosis present

## 2015-06-08 DIAGNOSIS — Z79891 Long term (current) use of opiate analgesic: Secondary | ICD-10-CM | POA: Diagnosis not present

## 2015-06-08 DIAGNOSIS — Z66 Do not resuscitate: Secondary | ICD-10-CM | POA: Diagnosis present

## 2015-06-08 DIAGNOSIS — I350 Nonrheumatic aortic (valve) stenosis: Secondary | ICD-10-CM

## 2015-06-08 DIAGNOSIS — S91209A Unspecified open wound of unspecified toe(s) with damage to nail, initial encounter: Secondary | ICD-10-CM

## 2015-06-08 DIAGNOSIS — Z89511 Acquired absence of right leg below knee: Secondary | ICD-10-CM

## 2015-06-08 DIAGNOSIS — E1151 Type 2 diabetes mellitus with diabetic peripheral angiopathy without gangrene: Secondary | ICD-10-CM | POA: Diagnosis present

## 2015-06-08 DIAGNOSIS — D509 Iron deficiency anemia, unspecified: Secondary | ICD-10-CM | POA: Diagnosis present

## 2015-06-08 DIAGNOSIS — E785 Hyperlipidemia, unspecified: Secondary | ICD-10-CM | POA: Diagnosis present

## 2015-06-08 DIAGNOSIS — I739 Peripheral vascular disease, unspecified: Secondary | ICD-10-CM

## 2015-06-08 DIAGNOSIS — E46 Unspecified protein-calorie malnutrition: Secondary | ICD-10-CM | POA: Diagnosis present

## 2015-06-08 DIAGNOSIS — I441 Atrioventricular block, second degree: Secondary | ICD-10-CM | POA: Diagnosis present

## 2015-06-08 DIAGNOSIS — E118 Type 2 diabetes mellitus with unspecified complications: Secondary | ICD-10-CM | POA: Diagnosis not present

## 2015-06-08 DIAGNOSIS — L97929 Non-pressure chronic ulcer of unspecified part of left lower leg with unspecified severity: Secondary | ICD-10-CM | POA: Diagnosis present

## 2015-06-08 DIAGNOSIS — I10 Essential (primary) hypertension: Secondary | ICD-10-CM | POA: Diagnosis present

## 2015-06-08 DIAGNOSIS — S0990XA Unspecified injury of head, initial encounter: Secondary | ICD-10-CM | POA: Diagnosis not present

## 2015-06-08 DIAGNOSIS — Z79899 Other long term (current) drug therapy: Secondary | ICD-10-CM

## 2015-06-08 DIAGNOSIS — I4891 Unspecified atrial fibrillation: Secondary | ICD-10-CM | POA: Diagnosis not present

## 2015-06-08 DIAGNOSIS — R531 Weakness: Secondary | ICD-10-CM | POA: Diagnosis present

## 2015-06-08 DIAGNOSIS — D500 Iron deficiency anemia secondary to blood loss (chronic): Secondary | ICD-10-CM | POA: Insufficient documentation

## 2015-06-08 DIAGNOSIS — E119 Type 2 diabetes mellitus without complications: Secondary | ICD-10-CM

## 2015-06-08 DIAGNOSIS — B351 Tinea unguium: Secondary | ICD-10-CM

## 2015-06-08 DIAGNOSIS — R2990 Unspecified symptoms and signs involving the nervous system: Secondary | ICD-10-CM | POA: Diagnosis present

## 2015-06-08 DIAGNOSIS — W19XXXA Unspecified fall, initial encounter: Secondary | ICD-10-CM | POA: Insufficient documentation

## 2015-06-08 DIAGNOSIS — Z7982 Long term (current) use of aspirin: Secondary | ICD-10-CM | POA: Diagnosis not present

## 2015-06-08 DIAGNOSIS — W050XXA Fall from non-moving wheelchair, initial encounter: Secondary | ICD-10-CM | POA: Diagnosis present

## 2015-06-08 DIAGNOSIS — K625 Hemorrhage of anus and rectum: Secondary | ICD-10-CM

## 2015-06-08 DIAGNOSIS — K922 Gastrointestinal hemorrhage, unspecified: Secondary | ICD-10-CM | POA: Insufficient documentation

## 2015-06-08 DIAGNOSIS — I440 Atrioventricular block, first degree: Secondary | ICD-10-CM | POA: Diagnosis not present

## 2015-06-08 DIAGNOSIS — R4781 Slurred speech: Secondary | ICD-10-CM | POA: Diagnosis present

## 2015-06-08 DIAGNOSIS — D649 Anemia, unspecified: Secondary | ICD-10-CM | POA: Diagnosis present

## 2015-06-08 HISTORY — DX: Reserved for inherently not codable concepts without codable children: IMO0001

## 2015-06-08 HISTORY — DX: Anemia, unspecified: D64.9

## 2015-06-08 LAB — COMPREHENSIVE METABOLIC PANEL
ALK PHOS: 68 U/L (ref 38–126)
ALT: 16 U/L — ABNORMAL LOW (ref 17–63)
ANION GAP: 8 (ref 5–15)
AST: 24 U/L (ref 15–41)
Albumin: 3 g/dL — ABNORMAL LOW (ref 3.5–5.0)
BUN: 12 mg/dL (ref 6–20)
CALCIUM: 8.5 mg/dL — AB (ref 8.9–10.3)
CHLORIDE: 107 mmol/L (ref 101–111)
CO2: 23 mmol/L (ref 22–32)
Creatinine, Ser: 0.73 mg/dL (ref 0.61–1.24)
Glucose, Bld: 126 mg/dL — ABNORMAL HIGH (ref 65–99)
Potassium: 3.6 mmol/L (ref 3.5–5.1)
SODIUM: 138 mmol/L (ref 135–145)
Total Bilirubin: 1.4 mg/dL — ABNORMAL HIGH (ref 0.3–1.2)
Total Protein: 6.9 g/dL (ref 6.5–8.1)

## 2015-06-08 LAB — CBC WITH DIFFERENTIAL/PLATELET
BASOS ABS: 0 10*3/uL (ref 0.0–0.1)
Basophils Relative: 0 %
EOS ABS: 0 10*3/uL (ref 0.0–0.7)
Eosinophils Relative: 0 %
HCT: 23.2 % — ABNORMAL LOW (ref 39.0–52.0)
Hemoglobin: 5.7 g/dL — CL (ref 13.0–17.0)
Lymphocytes Relative: 10 %
Lymphs Abs: 1.4 10*3/uL (ref 0.7–4.0)
MCH: 15.4 pg — ABNORMAL LOW (ref 26.0–34.0)
MCHC: 24.6 g/dL — ABNORMAL LOW (ref 30.0–36.0)
MCV: 62.9 fL — ABNORMAL LOW (ref 78.0–100.0)
MONO ABS: 1.2 10*3/uL — AB (ref 0.1–1.0)
Monocytes Relative: 9 %
NEUTROS PCT: 81 %
Neutro Abs: 10.9 10*3/uL — ABNORMAL HIGH (ref 1.7–7.7)
PLATELETS: 382 10*3/uL (ref 150–400)
RBC: 3.69 MIL/uL — AB (ref 4.22–5.81)
RDW: 19.6 % — AB (ref 11.5–15.5)
WBC: 13.5 10*3/uL — AB (ref 4.0–10.5)

## 2015-06-08 LAB — PREPARE RBC (CROSSMATCH)

## 2015-06-08 LAB — POC OCCULT BLOOD, ED: FECAL OCCULT BLD: POSITIVE — AB

## 2015-06-08 LAB — URINALYSIS, ROUTINE W REFLEX MICROSCOPIC
Glucose, UA: NEGATIVE mg/dL
Hgb urine dipstick: NEGATIVE
KETONES UR: 15 mg/dL — AB
LEUKOCYTES UA: NEGATIVE
NITRITE: NEGATIVE
PH: 5.5 (ref 5.0–8.0)
Protein, ur: NEGATIVE mg/dL
Specific Gravity, Urine: 1.022 (ref 1.005–1.030)

## 2015-06-08 LAB — I-STAT TROPONIN, ED: Troponin i, poc: 0.05 ng/mL (ref 0.00–0.08)

## 2015-06-08 LAB — GLUCOSE, CAPILLARY: GLUCOSE-CAPILLARY: 108 mg/dL — AB (ref 65–99)

## 2015-06-08 LAB — ABO/RH: ABO/RH(D): AB POS

## 2015-06-08 MED ORDER — SENNA-DOCUSATE SODIUM 8.6-50 MG PO TABS: 2.0000 | ORAL_TABLET | Freq: Every day | ORAL | Status: DC

## 2015-06-08 MED ORDER — LORAZEPAM 2 MG/ML IJ SOLN
1.0000 mg | Freq: Once | INTRAMUSCULAR | Status: AC
  Administered 2015-06-08: 1 mg via INTRAVENOUS
  Filled 2015-06-08: qty 1

## 2015-06-08 MED ORDER — TERBINAFINE HCL 250 MG PO TABS
250.0000 mg | ORAL_TABLET | Freq: Every day | ORAL | Status: DC
Start: 2015-06-08 — End: 2015-06-11
  Administered 2015-06-08 – 2015-06-11 (×4): 250 mg via ORAL
  Filled 2015-06-08 (×4): qty 1

## 2015-06-08 MED ORDER — FUROSEMIDE 40 MG PO TABS: 40.0000 mg | ORAL_TABLET | Freq: Every day | ORAL | Status: DC | PRN

## 2015-06-08 MED ORDER — SILVER SULFADIAZINE 1 % EX CREA
TOPICAL_CREAM | Freq: Once | CUTANEOUS | Status: AC
  Administered 2015-06-08: 21:00:00 via TOPICAL
  Filled 2015-06-08: qty 85

## 2015-06-08 MED ORDER — HYDROCODONE-ACETAMINOPHEN 5-325 MG PO TABS
1.0000 | ORAL_TABLET | Freq: Four times a day (QID) | ORAL | Status: DC | PRN
  Administered 2015-06-10: 1 via ORAL
  Filled 2015-06-08: qty 1

## 2015-06-08 MED ORDER — INSULIN ASPART 100 UNIT/ML ~~LOC~~ SOLN
0.0000 [IU] | Freq: Three times a day (TID) | SUBCUTANEOUS | Status: DC
  Administered 2015-06-09: 2 [IU] via SUBCUTANEOUS
  Administered 2015-06-10: 1 [IU] via SUBCUTANEOUS
  Administered 2015-06-10 – 2015-06-11 (×2): 2 [IU] via SUBCUTANEOUS

## 2015-06-08 MED ORDER — ACETAMINOPHEN 325 MG PO TABS: 650.0000 mg | ORAL_TABLET | Freq: Four times a day (QID) | ORAL | Status: DC | PRN

## 2015-06-08 MED ORDER — SODIUM CHLORIDE 0.9% FLUSH
3.0000 mL | Freq: Two times a day (BID) | INTRAVENOUS | Status: DC
  Administered 2015-06-08 – 2015-06-11 (×5): 3 mL via INTRAVENOUS

## 2015-06-08 MED ORDER — SODIUM CHLORIDE 0.9 % IV SOLN
Freq: Once | INTRAVENOUS | Status: AC
  Administered 2015-06-08: 16:00:00 via INTRAVENOUS

## 2015-06-08 MED ORDER — ACETAMINOPHEN 650 MG RE SUPP: 650.0000 mg | Freq: Four times a day (QID) | RECTAL | Status: DC | PRN

## 2015-06-08 MED ORDER — SIMVASTATIN 20 MG PO TABS
20.0000 mg | ORAL_TABLET | Freq: Every day | ORAL | Status: DC
  Administered 2015-06-08 – 2015-06-10 (×3): 20 mg via ORAL
  Filled 2015-06-08 (×4): qty 1

## 2015-06-08 MED ORDER — SENNOSIDES-DOCUSATE SODIUM 8.6-50 MG PO TABS
2.0000 | ORAL_TABLET | Freq: Every day | ORAL | Status: DC
  Administered 2015-06-08 – 2015-06-11 (×4): 2 via ORAL
  Filled 2015-06-08 (×4): qty 2

## 2015-06-08 MED ORDER — POLYVINYL ALCOHOL 1.4 % OP SOLN
1.0000 [drp] | Freq: Four times a day (QID) | OPHTHALMIC | Status: DC | PRN
  Filled 2015-06-08: qty 15

## 2015-06-08 MED ORDER — ONDANSETRON HCL 4 MG PO TABS
4.0000 mg | ORAL_TABLET | Freq: Four times a day (QID) | ORAL | Status: DC | PRN
Start: 2015-06-08 — End: 2015-06-11

## 2015-06-08 MED ORDER — DULOXETINE HCL 20 MG PO CPEP
20.0000 mg | ORAL_CAPSULE | Freq: Every day | ORAL | Status: DC
  Administered 2015-06-09 – 2015-06-11 (×3): 20 mg via ORAL
  Filled 2015-06-08 (×3): qty 1

## 2015-06-08 MED ORDER — STROKE: EARLY STAGES OF RECOVERY BOOK
Freq: Once | Status: AC
  Administered 2015-06-08: 20:00:00
  Filled 2015-06-08: qty 1

## 2015-06-08 MED ORDER — ONDANSETRON HCL 4 MG/2ML IJ SOLN: 4.0000 mg | Freq: Four times a day (QID) | INTRAMUSCULAR | Status: DC | PRN

## 2015-06-08 MED ORDER — TRAZODONE HCL 50 MG PO TABS
25.0000 mg | ORAL_TABLET | Freq: Every evening | ORAL | Status: DC | PRN
  Administered 2015-06-09: 25 mg via ORAL
  Filled 2015-06-08 (×2): qty 1

## 2015-06-08 NOTE — ED Notes (Signed)
Admitting on bedside.

## 2015-06-08 NOTE — ED Provider Notes (Signed)
CSN: [id number]     Arrival date & time 06/08/15  1131 History   First MD Initiated Contact with Patient 06/08/15 1143     Chief Complaint  Patient presents with  . Facial Droop  . Weakness     (Consider location/radiation/quality/duration/timing/severity/associated sxs/prior Treatment) HPI   Spencer [name] is a 82 y.o. male who is here for evaluation of right facial weakness, suspected related to a fall several days ago, from a wheelchair. The facial weakness was noted, at some point this morning by his nurse and then later by family members who last saw him 3 days ago. He lives in a nursing care facility. He is hard of hearing, but is able to contribute to history. He denies other recent illnesses. His daughter reports that he has left toe was injured, and bandaged. This morning at his facility. There are no other known modifying factors.   Past Medical History  Diagnosis Date  . Diabetes mellitus without complication ([place])   . Hypertension   . Arthritis   . High cholesterol   . Peripheral vascular disease ([place])   . Hyperlipidemia   . Aortic stenosis   . Atrial fibrillation [facility])    Past Surgical History  Procedure Laterality Date  . Appendectomy    . Irrigation and debridement foot Right 01/23/2015    Procedure: IRRIGATION AND DEBRIDEMENT FOOT;  Surgeon: Samara Deist, DPM;  Location: [facility] ORS;  Service: Podiatry;  Laterality: Right;  . Peripheral vascular catheterization N/A 01/27/2015    Procedure: Abdominal Aortogram w/Lower Extremity;  Surgeon: Katha Cabal, MD;  Location: [facility];  Service: Cardiovascular;  Laterality: N/A;  . Peripheral vascular catheterization  01/27/2015    Procedure: Lower Extremity Intervention;  Surgeon: Katha Cabal, MD;  Location: [place] [facility];  Service: Cardiovascular;;  . Amputation Right 01/30/2015    Procedure: AMPUTATION FOOT/ TRANSMETATARSAL;  Surgeon: Samara Deist, DPM;  Location: [facility] OR[facility];  Service: Podiatry;   Laterality: Right;  . Amputation Right 04/10/2015    Procedure: AMPUTATION BELOW KNEE;  Surgeon: Katha Cabal, MD;  Location: [facility] ORS;  Service: Vascular;  Laterality: Right;   Family History  Problem Relation Age of Onset  . Prostate cancer Father   . Heart attack Father   . Diabetes Mother    Social History  Substance Use Topics  . Smoking status: Former Research scientist (life sciences)  . Smokeless tobacco: Never Used  . Alcohol Use: No    Review of Systems  All other systems reviewed and are negative.     Allergies  Review of patient's allergies indicates no known allergies.  Home Medications   Prior to Admission medications   Medication Sig Start Date End Date Taking? Authorizing Provider  apixaban (ELIQUIS) 5 MG TABS tablet Take 5 mg by mouth 2 (two) times daily.    Historical Provider, MD  aspirin EC 81 MG tablet Take 81 mg by mouth every other day.     Historical Provider, MD  cephALEXin (KEFLEX) 500 MG capsule Take 500 mg by mouth 2 (two) times daily. Reported on 04/10/2015    Historical Provider, MD  DULoxetine (CYMBALTA) 20 MG capsule Take 20 mg by mouth daily.    Historical Provider, MD  furosemide (LASIX) 40 MG tablet Take 40 mg by mouth daily as needed. Reported on 01/23/2015    Historical Provider, MD  HYDROcodone-acetaminophen (NORCO/VICODIN) 5-325 MG tablet Take 1 tablet by mouth every 6 (six) hours as needed for moderate pain. 02/02/15   Sital Mody,  MD  metFORMIN (GLUCOPHAGE-XR) 500 MG 24 hr tablet Take 500 mg by mouth daily.    Historical Provider, MD  Probiotic Product (PROBIOTIC DAILY PO) Take 1 capsule by mouth at bedtime.    Historical Provider, MD  sennosides-docusate sodium (SENOKOT-S) 8.6-50 MG tablet Take 2 tablets by mouth daily.    Historical Provider, MD  simvastatin (ZOCOR) 20 MG tablet Take 20 mg by mouth at bedtime.     Historical Provider, MD   BP 130/49 mmHg  Pulse 77  Temp(Src) 98.1 F (36.7 C) (Oral)  Resp 19  SpO2 98% Physical Exam  Constitutional: He is  oriented to person, place, and time. He appears well-developed.  Elderly, frail  HENT:  Head: Normocephalic.  Right Ear: External ear normal.  Left Ear: External ear normal.  Abrasion midforehead, with dried blood on it. Mild swelling and redness right lower nasal alae, and right cheek. Questionable pustule in right nasal alae oriface.  Eyes: Conjunctivae and EOM are normal. Pupils are equal, round, and reactive to light.  Neck: Normal range of motion and phonation normal. Neck supple.  Cardiovascular: Normal rate, regular rhythm and normal heart sounds.   Pulmonary/Chest: Effort normal and breath sounds normal. He exhibits no bony tenderness.  Abdominal: Soft. There is no tenderness.  Genitourinary:  Normal anus. Brown stool in rectal vault, no fecal impaction. No palpable external or internal hemorrhoids.  Musculoskeletal: Normal range of motion.  Left first toenail has been avulsed, from the base, and has subungual blood. Is no swelling of the toe.  Neurological: He is alert and oriented to person, place, and time. No cranial nerve deficit or sensory deficit. He exhibits normal muscle tone. Coordination normal.  Skin: Skin is warm, dry and intact.  Superficial. Wounds anterior left shin. They appear subacute in her healing.  Psychiatric: He has a normal mood and affect. His behavior is normal. Judgment and thought content normal.  Nursing note and vitals reviewed.   ED Course  Procedures (including critical care time)  Medications  0.9 %  sodium chloride infusion (not administered)    Patient Vitals for the past 24 hrs:  BP Temp Temp src Pulse Resp SpO2  06/08/15 1400 (!) 130/49 mmHg - - 77 19 98 %  06/08/15 1345 (!) 135/53 mmHg - - 75 16 100 %  06/08/15 1315 (!) 120/49 mmHg - - 75 20 98 %  06/08/15 1230 (!) 124/53 mmHg - - 69 16 99 %  06/08/15 1215 (!) 122/48 mmHg - - 82 20 99 %  06/08/15 1200 (!) 125/50 mmHg - - 77 22 96 %  06/08/15 1145 (!) 133/48 mmHg - - 79 18 100 %   06/08/15 1140 132/62 mmHg 98.1 F (36.7 C) Oral 81 20 99 %  06/08/15 1131 - - - - - 98 %    2:20 PM-Consult complete with Hospitalist. Patient case explained and discussed. She agrees to admit patient for further evaluation and treatment. Call ended at 15:41  3:41 PM Reevaluation with update and discussion. After initial assessment and treatment, an updated evaluation reveals patient remains comfortable at this time. Patient and family updated on findings and plan.Daleen Bo L    Labs Review Labs Reviewed  CBC WITH DIFFERENTIAL/PLATELET - Abnormal; Notable for the following:    WBC 13.5 (*)    RBC 3.69 (*)    Hemoglobin 5.7 (*)    HCT 23.2 (*)    MCV 62.9 (*)    MCH 15.4 (*)    MCHC 24.6 (*)  RDW 19.6 (*)    Neutro Abs 10.9 (*)    Monocytes Absolute 1.2 (*)    All other components within normal limits  COMPREHENSIVE METABOLIC PANEL - Abnormal; Notable for the following:    Glucose, Bld 126 (*)    Calcium 8.5 (*)    Albumin 3.0 (*)    ALT 16 (*)    Total Bilirubin 1.4 (*)    All other components within normal limits  URINALYSIS, ROUTINE W REFLEX MICROSCOPIC (NOT AT [facility]) - Abnormal; Notable for the following:    Color, Urine AMBER (*)    Bilirubin Urine SMALL (*)    Ketones, ur 15 (*)    All other components within normal limits  POC OCCULT BLOOD, ED - Abnormal; Notable for the following:    Fecal Occult Bld POSITIVE (*)    All other components within normal limits  URINE CULTURE  I-STAT TROPOININ, ED  TYPE AND SCREEN  PREPARE RBC (CROSSMATCH)    Imaging Review Ct Head Wo Contrast  06/08/2015  CLINICAL DATA:  Right-sided facial droop. EXAM: CT HEAD WITHOUT CONTRAST TECHNIQUE: Contiguous axial images were obtained from the base of the skull through the vertex without intravenous contrast. COMPARISON:  01/23/2015 FINDINGS: There is a hyperdense, partially calcified anterior parafalcine mass likely representing a meningioma unchanged compared with the prior exam.  There is no evidence of mass effect, midline shift, or extra-axial fluid collections. There is no evidence of a space-occupying lesion or intracranial hemorrhage. There is no evidence of a cortical-based area of acute infarction. There is generalized cerebral atrophy. There is periventricular white matter low attenuation likely secondary to microangiopathy. The ventricles and sulci are appropriate for the patient's age. The basal cisterns are patent. Visualized portions of the orbits are unremarkable. The visualized portions of the paranasal sinuses and mastoid air cells are unremarkable. Cerebrovascular atherosclerotic calcifications are noted. The osseous structures are unremarkable. IMPRESSION: 1. No acute intracranial pathology. 2. Chronic microvascular disease and cerebral atrophy. Electronically Signed   By: Kathreen Devoid   On: 06/08/2015 12:23   Ct Maxillofacial Wo Cm  06/08/2015  CLINICAL DATA:  82 year old male with history of trauma from a fall 3 days ago with laceration in the left frontal region. EXAM: CT MAXILLOFACIAL WITHOUT CONTRAST TECHNIQUE: Multidetector CT imaging of the maxillofacial structures was performed. Multiplanar CT image reconstructions were also generated. A small metallic BB was placed on the right temple in order to reliably differentiate right from left. COMPARISON:  Head CT 06/08/2015. FINDINGS: No acute displaced facial bone fractures. Specifically, pterygoid plates are intact. Mandible is intact, and the mandibular condyles are located bilaterally. Bilateral globes and retro-orbital soft tissues are grossly normal in appearance. Soft tissue swelling beneath the nose and anterior to the right side of the maxilla. Visualized intracranial contents demonstrate severe cerebral atrophy. Paranasal sinuses and mastoids are well pneumatized. IMPRESSION: 1. No evidence of significant acute traumatic injury to the facial bones. Electronically Signed   By: Vinnie Langton M.D.   On:  06/08/2015 13:14   I have personally reviewed and evaluated these images and lab results as part of my medical decision-making.   EKG Interpretation   Date/Time:  Monday Jun 08 2015 11:41:13 EDT Ventricular Rate:  79 PR Interval:    QRS Duration: 135 QT Interval:  410 QTC Calculation: 470 R Axis:   85 Text Interpretation:  indeterminate rhythym Right bundle branch block  Nonspecific repol abnormality, lateral leads since last tracing no  significant change Confirmed by [facility]  MD, Vira Agar [phone]) on 06/08/2015  12:19:38 PM      MDM   Final diagnoses:  Iron deficiency anemia due to chronic blood loss  Rectal bleeding  Fall, initial encounter  Head injury, initial encounter  Contusion of face, initial encounter  Toenail avulsion, initial encounter    Fall with head injury. Concern for facial asymmetry, with presence of contusion from recent fall. CT negative for acute CVA. No facial bone fractures. Incidental anemia, with guaiac positive stool. Suspect chronic blood loss, leading to weakness and subsequent fall. Doubt CVA or serious bacterial infection at this time. He will require admission for observation, blood transfusion and reassessment.  Nursing Notes Reviewed/ Care Coordinated, and agree without changes. Applicable Imaging Reviewed.  Interpretation of Laboratory Data incorporated into ED treatment  Plan: Admit    Daleen Bo, MD 06/08/15 1541

## 2015-06-08 NOTE — ED Notes (Signed)
Dr. Eulis Foster notified of critical hgb.

## 2015-06-08 NOTE — Progress Notes (Signed)
Orthopedic Tech Progress Note Patient Details:  Spencer [name] 05/24/1917 [id number] Applied Unna boot to LLE. Ortho Devices Type of Ortho Device: Haematologist Ortho Device/Splint Location: LLE Ortho Device/Splint Interventions: Application   Darrol Poke 06/08/2015, 7:13 PM

## 2015-06-08 NOTE — ED Notes (Signed)
Phlebotomy at bedside.

## 2015-06-08 NOTE — H&P (Signed)
History and Physical    Spencer [name] [id number] DOB: 25-Feb-1917 DOA: 06/08/2015  PCP: [name], MD  Patient coming from:  [facility] home in [place], [place]   Chief Complaint: Right facial droop and slurred speech  HPI: Spencer [name] is a 82 y.o. male with multiple medical problems not limited to diabetes, hypertension, aortic stenosis, peripheral last disease and atrial fibrillation. 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 Patient apparently slipped out of his wheelchair, fell and struck his head on Friday but was not brought to the ED. Patient HOH, daughter and son-in-law in room and helped provide history. Patient was not dizzy when he slipped out of his wheelchair Friday. He was leaning down to pick something up and fell out of the wheelchair. He has chronic SOB, no worse than usual. No chest pains or dizziness. Per daughter, no one at nursing home as reported black stools. Since January family has noticed intermittent confusion, intermittent slurred speech but right facial droop today is new.    ED Course:  WBC 13.5- chronic leukocytosis. White count at baseline 13-15 Hemoglobin 5.7, down from baseline of 8-10 though it was 11 late March.  MCV 63 Maxillofacial CTscan - no acute findings.   Review of Systems:  Poor appetite, 20 pound weight loss since March. Frequent nosebleeds . As per HPI, otherwise 10 point review of systems negative.    Past Medical History  Diagnosis Date  . Diabetes mellitus without complication ([place])   . Hypertension   . Arthritis   . High cholesterol   . Peripheral vascular disease ([place])   . Hyperlipidemia   . Aortic stenosis   . Atrial fibrillation [facility])     Past Surgical History  Procedure Laterality Date  . Appendectomy    . Irrigation and debridement foot Right 01/23/2015    Procedure: IRRIGATION AND DEBRIDEMENT FOOT;  Surgeon: Samara Deist, DPM;  Location: [facility] ORS;  Service: Podiatry;  Laterality: Right;  . Peripheral  vascular catheterization N/A 01/27/2015    Procedure: Abdominal Aortogram w/Lower Extremity;  Surgeon: Katha Cabal, MD;  Location: [facility];  Service: Cardiovascular;  Laterality: N/A;  . Peripheral vascular catheterization  01/27/2015    Procedure: Lower Extremity Intervention;  Surgeon: Katha Cabal, MD;  Location: [place] [facility];  Service: Cardiovascular;;  . Amputation Right 01/30/2015    Procedure: AMPUTATION FOOT/ TRANSMETATARSAL;  Surgeon: Samara Deist, DPM;  Location: [facility] OR[facility];  Service: Podiatry;  Laterality: Right;  . Amputation Right 04/10/2015    Procedure: AMPUTATION BELOW KNEE;  Surgeon: Katha Cabal, MD;  Location: [facility] OR[facility];  Service: Vascular;  Laterality: Right;   Lives in Nursing home in [place]. Recent right BKA, not ambulating. Awaiting prosthesis  reports that he has quit smoking. He has never used smokeless tobacco. He reports that he does not drink alcohol or use illicit drugs.  No Known Allergies  Family History  Problem Relation Age of Onset  . Prostate cancer Father   . Heart attack Father   . Diabetes Mother     Prior to Admission medications   Medication Sig Start Date End Date Taking? Authorizing Provider  acetaminophen (TYLENOL) 325 MG tablet Take 650 mg by mouth every 4 (four) hours as needed for mild pain or fever.   Yes Historical Provider, MD  apixaban (ELIQUIS) 5 MG TABS tablet Take 5 mg by mouth 2 (two) times daily.   Yes Historical Provider, MD  aspirin EC 81 MG tablet Take 81 mg  by mouth every other day.    Yes Historical Provider, MD  DULoxetine (CYMBALTA) 20 MG capsule Take 20 mg by mouth daily.   Yes Historical Provider, MD  furosemide (LASIX) 40 MG tablet Take 40 mg by mouth daily as needed for fluid. Reported on 01/23/2015   Yes Historical Provider, MD  HYDROcodone-acetaminophen (NORCO/VICODIN) 5-325 MG tablet Take 1 tablet by mouth every 6 (six) hours as needed for moderate pain. 02/02/15  Yes Bettey Costa, MD    polyvinyl alcohol (LIQUIFILM TEARS) 1.4 % ophthalmic solution Place 1 drop into both eyes daily as needed for dry eyes (and irritation).   Yes Historical Provider, MD  Probiotic Product (PROBIOTIC DAILY PO) Take 1 capsule by mouth at bedtime.   Yes Historical Provider, MD  sennosides-docusate sodium (SENOKOT-S) 8.6-50 MG tablet Take 2 tablets by mouth daily.   Yes Historical Provider, MD  simvastatin (ZOCOR) 20 MG tablet Take 20 mg by mouth at bedtime.    Yes Historical Provider, MD  metFORMIN (GLUCOPHAGE-XR) 500 MG 24 hr tablet Take 500 mg by mouth daily.    Historical Provider, MD    Physical Exam: Filed Vitals:   06/08/15 1345 06/08/15 1400 06/08/15 1430 06/08/15 1445  BP: 135/53 130/49 140/46 132/48  Pulse: 75 77 72 79  Temp:      TempSrc:      Resp: 16 19 15 20   SpO2: 100% 98% 99% 100%    Constitutional:  Pleasant white male in NAD, calm, comfortable Filed Vitals:   06/08/15 1345 06/08/15 1400 06/08/15 1430 06/08/15 1445  BP: 135/53 130/49 140/46 132/48  Pulse: 75 77 72 79  Temp:      TempSrc:      Resp: 16 19 15 20   SpO2: 100% 98% 99% 100%   Eyes: PER, lids and conjunctivae normal ENMT: right nare erythematous, irritated. Mucous membranes are moist. Posterior pharynx clear of any exudate or lesions.Normal dentition.  Neck: normal, supple, no masses Respiratory: clear to auscultation bilaterally, no wheezing, no crackles. Normal respiratory effort. No accessory muscle use.  Cardiovascular: Normal rate, irregular rhythm, loud murmur. Pitting left pedal edema. 1-2+ left pedal pulse. Right BKA.  Abdomen: no tenderness, no masses palpated. No hepatomegaly. Bowel sounds positive.  Musculoskeletal: no clubbing / cyanosis. No joint deformity upper and lower extremities. Good ROM, no contractures. Normal muscle tone.  Skin: no rashes, lesions, ulcers.  Neurologic:  Alert, oriented, thought content appropriate.Ptosis not present, extra-ocular motions intact, slight right facial  droop, bilateral shoulder shrug, midline tongue extension, speech fluent without evidence of aphasia.  Strength 5/5 in all 4.  Psychiatric: Normal judgment and insight.  Normal mood.   Labs on Admission: I have personally reviewed following labs and imaging studies   Urine analysis:    Component Value Date/Time   COLORURINE AMBER* 06/08/2015 [place] 06/08/2015 1204   LABSPEC 1.022 06/08/2015 1204   PHURINE 5.5 06/08/2015 [place] 06/08/2015 [place] 06/08/2015 1204   BILIRUBINUR SMALL* 06/08/2015 1204   KETONESUR 15* 06/08/2015 1204   PROTEINUR NEGATIVE 06/08/2015 1204   NITRITE NEGATIVE 06/08/2015 1204   LEUKOCYTESUR NEGATIVE 06/08/2015 1204    Radiological Exams on Admission: Ct Head Wo Contrast  06/08/2015  CLINICAL DATA:  Right-sided facial droop. EXAM: CT HEAD WITHOUT CONTRAST TECHNIQUE: Contiguous axial images were obtained from the base of the skull through the vertex without intravenous contrast. COMPARISON:  01/23/2015 FINDINGS: There is a hyperdense, partially calcified anterior parafalcine mass likely representing  a meningioma unchanged compared with the prior exam. There is no evidence of mass effect, midline shift, or extra-axial fluid collections. There is no evidence of a space-occupying lesion or intracranial hemorrhage. There is no evidence of a cortical-based area of acute infarction. There is generalized cerebral atrophy. There is periventricular white matter low attenuation likely secondary to microangiopathy. The ventricles and sulci are appropriate for the patient's age. The basal cisterns are patent. Visualized portions of the orbits are unremarkable. The visualized portions of the paranasal sinuses and mastoid air cells are unremarkable. Cerebrovascular atherosclerotic calcifications are noted. The osseous structures are unremarkable. IMPRESSION: 1. No acute intracranial pathology. 2. Chronic microvascular disease and  cerebral atrophy. Electronically Signed   By: Kathreen Devoid   On: 06/08/2015 12:23   Ct Maxillofacial Wo Cm  06/08/2015  CLINICAL DATA:  82 year old male with history of trauma from a fall 3 days ago with laceration in the left frontal region. EXAM: CT MAXILLOFACIAL WITHOUT CONTRAST TECHNIQUE: Multidetector CT imaging of the maxillofacial structures was performed. Multiplanar CT image reconstructions were also generated. A small metallic BB was placed on the right temple in order to reliably differentiate right from left. COMPARISON:  Head CT 06/08/2015. FINDINGS: No acute displaced facial bone fractures. Specifically, pterygoid plates are intact. Mandible is intact, and the mandibular condyles are located bilaterally. Bilateral globes and retro-orbital soft tissues are grossly normal in appearance. Soft tissue swelling beneath the nose and anterior to the right side of the maxilla. Visualized intracranial contents demonstrate severe cerebral atrophy. Paranasal sinuses and mastoids are well pneumatized. IMPRESSION: 1. No evidence of significant acute traumatic injury to the facial bones. Electronically Signed   By: Vinnie Langton M.D.   On: 06/08/2015 13:14    EKG: Independently reviewed.   EKG Interpretation  Date/Time:  Monday Jun 08 2015 11:41:13 EDT Ventricular Rate:  79 PR Interval:    QRS Duration: 135 QT Interval:  410 QTC Calculation: 470 R Axis:   85 Text Interpretation:  indeterminate rhythym Right bundle branch block Nonspecific repol abnormality, lateral leads since last tracing no significant change Confirmed by Eulis Foster  MD, Vira Agar [id number]) on 06/08/2015 12:19:38 PM      Assessment/Plan   Active Problems:   Anemia   Facial droop   Hypertension   Aortic stenosis   Peripheral vascular disease ([place])   Onychomycosis of left great toe   Diabetes mellitus, type 2 ([facility])     Right facial droop. Intermittent slurred speech and confusion since January. MRI without contrast of brain in  mid January revealed microvascular ischemic change  / chronic lacunar infarcts and extra-axial mass felt to represent meningioma in midline frontal falx. Right facial droop is new. Head CTscan today is negative.  -admit - telemetry         -TIA / Stroke order set utilized -follow up lipid panel, MRI, MRA head and neck -PT, OT -RN swallow screen  Profound microcytic anemia. Heme positive brown stools per EDP. Hemoglobin 5.7, down from baseline of 8-10,  though it was 11 late March. Started on Eloquis in January. Suspect very slow GI bleed. PUD vrs intestinal AVMs vrs colon neoplasm -anemia panel not ordered and blood already being transfused. Probably iron deficient given microcytosis -follow up CBC after 2nd unit of blood and in am  -PPI, PO fine -Will ask GI to evaluate.  -Holding home baby asa and Eliquis  - clear liquids if passes swallow screen then NPO after MN (GI eval)  Atrial fibrillation, on Eliquis.  Rate controlled. Chadsvasc  -hold Eliquis for now -Monitor on telemetry  Diabetes, type 2.  . -Hold home Metformin -CBGs, SSI - sensitive -A1c  Aortic stenosis, No echocardiolgram / records in Epic. Await Echo to confirm severity.   Hypertension, controlled in ED. On Lasix. No records in EPIC -continue home lasix  ? Hx of depression, on Cymbalta.  -continue cymbalta  Hyperlipidemia . - Continue home statin   Peripheral edema, on lasix at home. Hx of heart failure? No echo reports in Epic. May be taking in preparation for prothesis fitting.  -continue home 40mg  lasix  -await echo     Constipation    -Continue home Senokot  PVD, s/p right BKA in March. He has LLE ulcers (shin),  healing. Recently had [employer] on.  -Will replace [employer].      Left great toe onychomycosis / Subungal hematoma from trauma.  -Will treat with Lamisil 250mg  daily for 12 weeks.  -needs outpatient LFTs in one month  Right inner thigh chaffing.  -silvadine once today  Protein calorie  malnutrition, 20 pound weight loss since March per family. Appetite poor.  -nutrition consult                         DVT prophylaxis:   None. Code Status:  DNR Family Communication:  Plan of treatment discussed with daughther and son- in- law in room, all questions answered  Disposition Plan:  Discharge back to nursing home in 2-3 days              Consults called:    Admission status:   Admission -  Telemetry    Tye Savoy NP [facility] Pager [phone]  If 7PM-7AM, please contact night-coverage [email or web address] Password [facility]  06/08/2015, 3:42 PM

## 2015-06-08 NOTE — ED Notes (Signed)
Attempted report to 6N 

## 2015-06-08 NOTE — ED Notes (Signed)
In CT with pt.

## 2015-06-08 NOTE — ED Notes (Signed)
Pt arrives from [facility] via [employer].. Pt LSN is not known. Pt family called EMS after seeing pt today with right sided facial droop and slurred speech. Facility reports pt had a fall and hit his head on Friday, but wasn't transported. Pt is on Eliquis x2 daily. EMS noted pt to have a right arm drift.

## 2015-06-09 ENCOUNTER — Encounter (HOSPITAL_COMMUNITY): Payer: Self-pay | Admitting: General Practice

## 2015-06-09 DIAGNOSIS — S0990XA Unspecified injury of head, initial encounter: Secondary | ICD-10-CM

## 2015-06-09 DIAGNOSIS — I440 Atrioventricular block, first degree: Secondary | ICD-10-CM

## 2015-06-09 LAB — TYPE AND SCREEN
ABO/RH(D): AB POS
ANTIBODY SCREEN: NEGATIVE
Unit division: 0
Unit division: 0

## 2015-06-09 LAB — GLUCOSE, CAPILLARY
GLUCOSE-CAPILLARY: 174 mg/dL — AB (ref 65–99)
GLUCOSE-CAPILLARY: 121 mg/dL — AB (ref 65–99)
Glucose-Capillary: 87 mg/dL (ref 65–99)
Glucose-Capillary: 99 mg/dL (ref 65–99)

## 2015-06-09 LAB — CBC
HCT: 31.9 % — ABNORMAL LOW (ref 39.0–52.0)
HEMOGLOBIN: 9.1 g/dL — AB (ref 13.0–17.0)
MCH: 19.5 pg — ABNORMAL LOW (ref 26.0–34.0)
MCHC: 28.5 g/dL — ABNORMAL LOW (ref 30.0–36.0)
MCV: 68.3 fL — ABNORMAL LOW (ref 78.0–100.0)
PLATELETS: 288 10*3/uL (ref 150–400)
RBC: 4.67 MIL/uL (ref 4.22–5.81)
RDW: 22.7 % — ABNORMAL HIGH (ref 11.5–15.5)
WBC: 10.7 10*3/uL — AB (ref 4.0–10.5)

## 2015-06-09 LAB — BASIC METABOLIC PANEL
ANION GAP: 9 (ref 5–15)
BUN: 9 mg/dL (ref 6–20)
CALCIUM: 8.1 mg/dL — AB (ref 8.9–10.3)
CO2: 21 mmol/L — ABNORMAL LOW (ref 22–32)
CREATININE: 0.73 mg/dL (ref 0.61–1.24)
Chloride: 109 mmol/L (ref 101–111)
GFR calc non Af Amer: >60 mL/min (ref >60)
Glucose, Bld: 90 mg/dL (ref 65–99)
Potassium: 3.4 mmol/L — ABNORMAL LOW (ref 3.5–5.1)
SODIUM: 139 mmol/L (ref 135–145)

## 2015-06-09 LAB — LIPID PANEL
CHOLESTEROL: 66 mg/dL (ref 0–200)
HDL: 21 mg/dL — AB (ref >40)
LDL Cholesterol: 33 mg/dL (ref 0–99)
TRIGLYCERIDES: 62 mg/dL (ref <150)
Total CHOL/HDL Ratio: 3.1 RATIO
VLDL: 12 mg/dL (ref 0–40)

## 2015-06-09 LAB — HEMOGLOBIN AND HEMATOCRIT, BLOOD
HEMATOCRIT: 33.5 % — AB (ref 39.0–52.0)
HEMOGLOBIN: 9.5 g/dL — AB (ref 13.0–17.0)

## 2015-06-09 LAB — HEMOGLOBIN A1C
HEMOGLOBIN A1C: 7.6 % — AB (ref 4.8–5.6)
MEAN PLASMA GLUCOSE: 171 mg/dL

## 2015-06-09 LAB — MRSA PCR SCREENING: MRSA BY PCR: POSITIVE — AB

## 2015-06-09 MED ORDER — LORAZEPAM 2 MG/ML IJ SOLN: 1.0000 mg | Freq: Once | INTRAMUSCULAR | Status: DC | PRN

## 2015-06-09 MED ORDER — MUPIROCIN 2 % EX OINT
1.0000 "application " | TOPICAL_OINTMENT | Freq: Two times a day (BID) | CUTANEOUS | Status: DC
  Administered 2015-06-09 – 2015-06-11 (×5): 1 via NASAL
  Filled 2015-06-09: qty 22

## 2015-06-09 MED ORDER — CHLORHEXIDINE GLUCONATE CLOTH 2 % EX PADS
6.0000 | MEDICATED_PAD | Freq: Every day | CUTANEOUS | Status: DC
  Administered 2015-06-10: 6 via TOPICAL

## 2015-06-09 MED ORDER — PANTOPRAZOLE SODIUM 40 MG IV SOLR
40.0000 mg | INTRAVENOUS | Status: DC
  Administered 2015-06-09: 40 mg via INTRAVENOUS
  Filled 2015-06-09: qty 40

## 2015-06-09 MED ORDER — WHITE PETROLATUM GEL
Status: AC
  Administered 2015-06-09: 12:00:00
  Filled 2015-06-09: qty 1

## 2015-06-09 MED ORDER — PANTOPRAZOLE SODIUM 40 MG IV SOLR
40.0000 mg | Freq: Two times a day (BID) | INTRAVENOUS | Status: DC
  Administered 2015-06-09 – 2015-06-10 (×2): 40 mg via INTRAVENOUS
  Filled 2015-06-09 (×2): qty 40

## 2015-06-09 MED ORDER — ENSURE ENLIVE PO LIQD
237.0000 mL | Freq: Two times a day (BID) | ORAL | Status: DC
  Administered 2015-06-09 – 2015-06-11 (×4): 237 mL via ORAL

## 2015-06-09 NOTE — Consult Note (Signed)
NEURO HOSPITALIST CONSULT NOTE   Requestig physician: Dr. Oda Cogan   Reason for Consult: right facial droop   History obtained from:  chart     HPI:                                                                                                                                          Spencer [name] is an 82 y.o. male with multiple medical problems not limited to diabetes, hypertension, aortic stenosis, peripheral last disease and atrial fibrillation. 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 Patient apparently slipped out of his wheelchair, fell and struck his head on Friday but was not brought to the ED. Per chart, patient was not dizzy when he slipped out of his wheelchair Friday. He was leaning down to pick something up and fell out of the wheelchair. He has chronic SOB, no worse than usual. He is on Eliquis for his Afib. Currently Eliquis is being held due to black stools and hemoccult positive stools. MRI brain was obtained and showed no acute infarct but did show hyperintensity on the left C1 level of cord of uncertain etiology. MRA head was negative. Patients family feels the right facial droop noted on admission is new.   Past Medical History  Diagnosis Date  . Diabetes mellitus without complication ([place])   . Hypertension   . Arthritis   . High cholesterol   . Peripheral vascular disease ([place])   . Hyperlipidemia   . Aortic stenosis   . Atrial fibrillation [facility])     Past Surgical History  Procedure Laterality Date  . Appendectomy    . Irrigation and debridement foot Right 01/23/2015    Procedure: IRRIGATION AND DEBRIDEMENT FOOT;  Surgeon: Samara Deist, DPM;  Location: [facility] ORS;  Service: Podiatry;  Laterality: Right;  . Peripheral vascular catheterization N/A 01/27/2015    Procedure: Abdominal Aortogram w/Lower Extremity;  Surgeon: Katha Cabal, MD;  Location: [place] [facility];  Service: Cardiovascular;  Laterality: N/A;  . Peripheral vascular  catheterization  01/27/2015    Procedure: Lower Extremity Intervention;  Surgeon: Katha Cabal, MD;  Location: [place] [facility];  Service: Cardiovascular;;  . Amputation Right 01/30/2015    Procedure: AMPUTATION FOOT/ TRANSMETATARSAL;  Surgeon: Samara Deist, DPM;  Location: [facility] ORS;  Service: Podiatry;  Laterality: Right;  . Amputation Right 04/10/2015    Procedure: AMPUTATION BELOW KNEE;  Surgeon: Katha Cabal, MD;  Location: [facility] ORS;  Service: Vascular;  Laterality: Right;    Family History  Problem Relation Age of Onset  . Prostate cancer Father   . Heart attack Father   . Diabetes Mother      Social History:  reports that he has quit smoking. He has never used smokeless tobacco. He reports that  he does not drink alcohol or use illicit drugs.  No Known Allergies  MEDICATIONS:                                                                                                                     Prior to Admission:  Prescriptions prior to admission  Medication Sig Dispense Refill Last Dose  . acetaminophen (TYLENOL) 325 MG tablet Take 650 mg by mouth every 4 (four) hours as needed for mild pain or fever.   06/06/2015  . apixaban (ELIQUIS) 5 MG TABS tablet Take 5 mg by mouth 2 (two) times daily.   06/08/2015 at 0900  . aspirin EC 81 MG tablet Take 81 mg by mouth every other day.    06/07/2015 at 0900  . DULoxetine (CYMBALTA) 20 MG capsule Take 20 mg by mouth daily.   06/08/2015 at 0900  . furosemide (LASIX) 40 MG tablet Take 40 mg by mouth daily as needed for fluid. Reported on 01/23/2015   unknown  . HYDROcodone-acetaminophen (NORCO/VICODIN) 5-325 MG tablet Take 1 tablet by mouth every 6 (six) hours as needed for moderate pain. 30 tablet 0 06/05/2015 at 0343  . metFORMIN (GLUMETZA) 500 MG (MOD) 24 hr tablet Take 500 mg by mouth daily with breakfast.   06/08/2015 at 0800  . polyvinyl alcohol (LIQUIFILM TEARS) 1.4 % ophthalmic solution Place 1 drop into both eyes 4 (four) times daily  as needed for dry eyes (and irritation).    05/27/2015  . Probiotic Product (PROBIOTIC DAILY PO) Take 1 capsule by mouth at bedtime.   06/07/2015 at 2100  . sennosides-docusate sodium (SENOKOT-S) 8.6-50 MG tablet Take 2 tablets by mouth daily.   06/08/2015 at 0900  . simvastatin (ZOCOR) 20 MG tablet Take 20 mg by mouth at bedtime.    06/07/2015 at 2100   Scheduled: . DULoxetine  20 mg Oral Daily  . insulin aspart  0-9 Units Subcutaneous TID WC  . pantoprazole (PROTONIX) IV  40 mg Intravenous Q24H  . senna-docusate  2 tablet Oral Daily  . simvastatin  20 mg Oral QHS  . sodium chloride flush  3 mL Intravenous Q12H  . terbinafine  250 mg Oral Daily     ROS:                                                                                                                                       History  obtained from unobtainable from patient due to mental status    Blood pressure 152/48, pulse 78, temperature 98.6 F (37 C), temperature source Oral, resp. rate 17, weight 63.685 kg (140 lb 6.4 oz), SpO2 95 %.   Neurologic Examination:                                                                                                      HEENT-  Normocephalic, no lesions, without obvious abnormality.  Normal external eye and conjunctiva.  Normal TM's bilaterally.  Normal auditory canals and external ears. Normal external nose, mucus membranes and septum.  Normal pharynx. Cardiovascular- irregularly irregular rhythm, pulses palpable throughout   Lungs- no tachypnea, retractions or cyanosis, Heart exam - S1, S2 normal, no murmur, no gallop, rate regular Abdomen- normal findings: bowel sounds normal Extremities- no edema Lymph-no adenopathy palpable Musculoskeletal-no joint tenderness, deformity or swelling Skin-warm and dry, no hyperpigmentation, vitiligo, or suspicious lesions  Neurological Examination Mental Status: drowsy, not oriented, follows simple commands. Speech clear Cranial Nerves: II:   Visual fields grossly normal, pupils equal, round, reactive to light and accommodation III,IV, VI: ptosis not present, extra-ocular motions intact bilaterally V,VII: smile symmetric with right facial droop due to partials missing, facial light touch sensation normal bilaterally VIII: hearing normal bilaterally IX,X: uvula rises symmetrically XI: bilateral shoulder shrug XII: midline tongue extension Motor: Right : Upper extremity   5/5    Left:     Upper extremity   5/5  Lower extremity   5/5     Lower extremity   5/5 Right AKA Tone and bulk:normal tone throughout; no atrophy noted Sensory: Pinprick and light touch intact throughout, bilaterally Deep Tendon Reflexes: 1+ and symmetric throughout UE no KJ or AJ Plantars: Right: AKA   Left:  mute Cerebellar: normal finger-to-nose,  Gait: not tested      Lab Results: Basic Metabolic Panel:  Recent Labs Lab 06/08/15 1208 06/09/15 0457  NA 138 139  K 3.6 3.4*  CL 107 109  CO2 23 21*  GLUCOSE 126* 90  BUN 12 9  CREATININE 0.73 0.73  CALCIUM 8.5* 8.1*    Liver Function Tests:  Recent Labs Lab 06/08/15 1208  AST 24  ALT 16*  ALKPHOS 68  BILITOT 1.4*  PROT 6.9  ALBUMIN 3.0*   No results for input(s): LIPASE, AMYLASE in the last 168 hours. No results for input(s): AMMONIA in the last 168 hours.  CBC:  Recent Labs Lab 06/08/15 1208 06/09/15 0457  WBC 13.5* 10.7*  NEUTROABS 10.9*  --   HGB 5.7* 9.1*  HCT 23.2* 31.9*  MCV 62.9* 68.3*  PLT 382 288    Cardiac Enzymes: No results for input(s): CKTOTAL, CKMB, CKMBINDEX, TROPONINI in the last 168 hours.  Lipid Panel:  Recent Labs Lab 06/09/15 0457  CHOL 66  TRIG 62  HDL 21*  CHOLHDL 3.1  VLDL 12  LDLCALC 33    CBG:  Recent Labs Lab 06/08/15 2129 06/09/15 0750  GLUCAP 108* 87    Microbiology: Results for orders placed or performed during the hospital encounter of  06/08/15  MRSA PCR Screening     Status: Abnormal   Collection Time: 06/09/15   6:06 AM  Result Value Ref Range Status   MRSA by PCR POSITIVE (A) NEGATIVE Final    Comment:        The GeneXpert MRSA Assay (FDA approved for NASAL specimens only), is one component of a comprehensive MRSA colonization surveillance program. It is not intended to diagnose MRSA infection nor to guide or monitor treatment for MRSA infections. RESULT CALLED TO, READ BACK BY AND VERIFIED WITH: Regino Schultze RN 10:20 06/09/15 ([email or web address])     Coagulation Studies: No results for input(s): LABPROT, INR in the last 72 hours.  Imaging: Ct Head Wo Contrast  06/08/2015  CLINICAL DATA:  Right-sided facial droop. EXAM: CT HEAD WITHOUT CONTRAST TECHNIQUE: Contiguous axial images were obtained from the base of the skull through the vertex without intravenous contrast. COMPARISON:  01/23/2015 FINDINGS: There is a hyperdense, partially calcified anterior parafalcine mass likely representing a meningioma unchanged compared with the prior exam. There is no evidence of mass effect, midline shift, or extra-axial fluid collections. There is no evidence of a space-occupying lesion or intracranial hemorrhage. There is no evidence of a cortical-based area of acute infarction. There is generalized cerebral atrophy. There is periventricular white matter low attenuation likely secondary to microangiopathy. The ventricles and sulci are appropriate for the patient's age. The basal cisterns are patent. Visualized portions of the orbits are unremarkable. The visualized portions of the paranasal sinuses and mastoid air cells are unremarkable. Cerebrovascular atherosclerotic calcifications are noted. The osseous structures are unremarkable. IMPRESSION: 1. No acute intracranial pathology. 2. Chronic microvascular disease and cerebral atrophy. Electronically Signed   By: Kathreen Devoid   On: 06/08/2015 12:23   Mr Brain Wo Contrast  06/08/2015  CLINICAL DATA:  [place] and hit head 3 days ago.  Right facial droop EXAM: MRI HEAD WITHOUT  CONTRAST MRA HEAD WITHOUT CONTRAST TECHNIQUE: Multiplanar, multiecho pulse sequences of the brain and surrounding structures were obtained without intravenous contrast. Angiographic images of the head were obtained using MRA technique without contrast. COMPARISON:  CT head 06/08/2015 FINDINGS: MRI HEAD FINDINGS The patient was not able to complete the study. Diffusion-weighted imaging only was obtained followed by MRA. No other imaging of the brain. MRA neck not performed. Negative for acute infarct. There is generalized atrophy with ventricular enlargement. 12 mm area of diffusion hyperintensity to the left of the spinal cord at approximately the C1 level. This appears separate from the spinal cord. Review of the recent CT of the face does not show a definite mass lesion in this area. This would require further imaging to determine the etiology and location of this abnormality. MRA HEAD FINDINGS Both vertebral arteries patent to the basilar. AICA patent bilaterally. Superior cerebellar and posterior cerebral arteries patent bilaterally. Left posterior communicating artery patent. Cavernous carotid widely patent bilaterally. Anterior and middle cerebral arteries patent bilaterally without stenosis. Negative for cerebral aneurysm. IMPRESSION: Limited imaging of the brain.  No acute infarct. 12 mm area of diffusion hyperintensity within the spinal canal to the left of the cord at the C1 level uncertain etiology. When the patient is able to hold still, MRI of the cervical spine with contrast suggested for further elucidation Negative MRA of the head. Electronically Signed   By: Franchot Gallo M.D.   On: 06/08/2015 21:00   Mr Lovenia Kim  06/08/2015  CLINICAL DATA:  [place] and hit head 3 days ago.  Right facial droop EXAM:  MRI HEAD WITHOUT CONTRAST MRA HEAD WITHOUT CONTRAST TECHNIQUE: Multiplanar, multiecho pulse sequences of the brain and surrounding structures were obtained without intravenous contrast. Angiographic  images of the head were obtained using MRA technique without contrast. COMPARISON:  CT head 06/08/2015 FINDINGS: MRI HEAD FINDINGS The patient was not able to complete the study. Diffusion-weighted imaging only was obtained followed by MRA. No other imaging of the brain. MRA neck not performed. Negative for acute infarct. There is generalized atrophy with ventricular enlargement. 12 mm area of diffusion hyperintensity to the left of the spinal cord at approximately the C1 level. This appears separate from the spinal cord. Review of the recent CT of the face does not show a definite mass lesion in this area. This would require further imaging to determine the etiology and location of this abnormality. MRA HEAD FINDINGS Both vertebral arteries patent to the basilar. AICA patent bilaterally. Superior cerebellar and posterior cerebral arteries patent bilaterally. Left posterior communicating artery patent. Cavernous carotid widely patent bilaterally. Anterior and middle cerebral arteries patent bilaterally without stenosis. Negative for cerebral aneurysm. IMPRESSION: Limited imaging of the brain.  No acute infarct. 12 mm area of diffusion hyperintensity within the spinal canal to the left of the cord at the C1 level uncertain etiology. When the patient is able to hold still, MRI of the cervical spine with contrast suggested for further elucidation Negative MRA of the head. Electronically Signed   By: Franchot Gallo M.D.   On: 06/08/2015 21:00   Ct Maxillofacial Wo Cm  06/08/2015  CLINICAL DATA:  82 year old male with history of trauma from a fall 3 days ago with laceration in the left frontal region. EXAM: CT MAXILLOFACIAL WITHOUT CONTRAST TECHNIQUE: Multidetector CT imaging of the maxillofacial structures was performed. Multiplanar CT image reconstructions were also generated. A small metallic BB was placed on the right temple in order to reliably differentiate right from left. COMPARISON:  Head CT 06/08/2015.  FINDINGS: No acute displaced facial bone fractures. Specifically, pterygoid plates are intact. Mandible is intact, and the mandibular condyles are located bilaterally. Bilateral globes and retro-orbital soft tissues are grossly normal in appearance. Soft tissue swelling beneath the nose and anterior to the right side of the maxilla. Visualized intracranial contents demonstrate severe cerebral atrophy. Paranasal sinuses and mastoids are well pneumatized. IMPRESSION: 1. No evidence of significant acute traumatic injury to the facial bones. Electronically Signed   By: Vinnie Langton M.D.   On: 06/08/2015 13:14     Assessment and plan per attending neurologist  Etta Quill PA-C [facility] [phone]  06/09/2015, 11:54 AM   Assessment/Plan:  82 YO male with right facial droop likely secondary to missing right lower partials. Concern for GI bleed and GI would like to stop Eliquis. Looking at the rhythm strip I believe he may be in Type II heart block and not Afib.  For this reason I would agree with stopping Eliquis. HE will need follow up as out patient with neurology.

## 2015-06-09 NOTE — Progress Notes (Signed)
Occupational Therapy Evaluation Patient Details Name: Spencer [name] MRN: [id number] DOB: 1917/10/28 Today's Date: 06/09/2015    History of Present Illness Spencer [name] is a 82 y.o. male with a Past Medical History of diabetes, HTN, HLD, PVD, A. fib who presents with symptomatic anemia, FOBT positive and here to r/o stroke with right facial droop after falling forward out of wheelchair and striking his head.    Clinical Impression   Patient presents to OT with decreased ADL independence and safety, but likely at or very close to baseline with ADLs (needed assistance with all PTA). No acute OT needs, but recommend OT follow-up at SNF as indicated. Acute OT will sign off.    Follow Up Recommendations  SNF    Equipment Recommendations  None recommended by OT    Recommendations for Other Services       Precautions / Restrictions Precautions Precautions: Fall Precaution Comments: fell forward out of wheelchair and struck head just prior to admission Restrictions Weight Bearing Restrictions: No Other Position/Activity Restrictions: R BKA      Mobility Bed Mobility Overal bed mobility: Needs Assistance Bed Mobility: Supine to Sit     Supine to sit: Mod assist;+2 for physical assistance;+2 for safety/equipment;HOB elevated     General bed mobility comments: needed assistance with legs, raising trunk up off bed, scooting to EOB  Transfers Overall transfer level: Needs assistance Equipment used: 1 person hand held assist;2 person hand held assist Transfers: Sit to/from [facility] Sit to Stand: Mod assist;+2 safety/equipment;From elevated surface Stand pivot transfers: Mod assist;Min assist;+2 safety/equipment       General transfer comment: needed assistance to rise, stabilize, and pivot    Balance                                            ADL Overall ADL's : At baseline                                        General ADL Comments: He is likely at baseline for ADLs. Needed assistance to setup tray for him to eat lunch. Needed assistance with washing peri area after bladder incontinence, needed assistance donning L sock and new gown. Needed assistance stand pivot transfer bed to recliner, +2 for safety.     Vision     Perception     Praxis      Pertinent Vitals/Pain Pain Assessment: No/denies pain     Hand Dominance Right   Extremity/Trunk Assessment Upper Extremity Assessment Upper Extremity Assessment: RUE deficits/detail;LUE deficits/detail RUE Deficits / Details: very limited shoulder AROM; AAROM WFL, generalized weakness LUE Deficits / Details: generalized weakness   Lower Extremity Assessment Lower Extremity Assessment: Defer to PT evaluation       Communication Communication Communication: HOH   Cognition Arousal/Alertness: Awake/alert Behavior During Therapy: WFL for tasks assessed/performed Overall Cognitive Status: Difficult to assess                     General Comments       Exercises       Shoulder Instructions      Home Living Family/patient expects to be discharged to:: Skilled nursing facility  Additional Comments: [facility]      Prior Functioning/Environment Level of Independence: Needs assistance  Gait / Transfers Assistance Needed: wheelchair primary means of mobility; staff uses lift to get to w/c vs staff physical assistance with gait belt ADL's / Homemaking Assistance Needed: assist for bathing and dressing; eats meals in room   Comments: daughter reports he was receiving PT/OT until recently when they put it on hold until he received his prosthesis.    OT Diagnosis: Generalized weakness   OT Problem List: Decreased strength;Decreased activity tolerance;Impaired balance (sitting and/or standing);Decreased cognition;Decreased safety awareness;Decreased knowledge of use of DME or  AE;Decreased knowledge of precautions;Impaired UE functional use   OT Treatment/Interventions:      OT Goals(Current goals can be found in the care plan section) Acute Rehab OT Goals OT Goal Formulation: All assessment and education complete, DC therapy  OT Frequency:     Barriers to D/C:            Co-evaluation PT/OT/SLP Co-Evaluation/Treatment: Yes Reason for Co-Treatment: For patient/therapist safety PT goals addressed during session: Mobility/safety with mobility OT goals addressed during session: ADL's and self-care      End of Session Equipment Utilized During Treatment: Gait belt Nurse Communication: Mobility status  Activity Tolerance: Patient tolerated treatment well Patient left: in chair;with call bell/phone within reach;with chair alarm set;with family/visitor present   Time: 1222-1258 OT Time Calculation (min): 36 min Charges:  OT General Charges $OT Visit: 1 Procedure OT Evaluation $OT Eval Moderate Complexity: 1 Procedure G-Codes:    Hodan Wurtz A Jun 26, 2015, 1:24 PM

## 2015-06-09 NOTE — Consult Note (Signed)
ELECTROPHYSIOLOGY CONSULT NOTE    Patient ID: Spencer [name] MRN: [id number], DOB/AGE: [birth year] 82 y.o.  Admit date: 06/08/2015 Date of Consult: 06/09/2015  Primary Physician: [name], MD Primary Cardiologist: Paraschos Referring MD: Elgergawy  Reason for Consultation: ?heart block   HPI:  Spencer [name] is a 82 y.o. male with a past medical history significant for PVD s/p R BKA, diabetes, hypertension, aortic stenosis, and hyperlipidemia.  During his admission for diabetic foot ulcer in January of this year at [facility], there was concern for AF identified on telemetry and he was placed on Eliquis at that time. The Friday before admission, he apparently fell out of his wheelchair and hit his head. He presented to [facility] on the day of admission for evaluation of right facial droop.  He has also had intermittent confusion since January.  MRI of head showed no acute infarct but did show hyperintensity at C1 level of cord of uncertain etiology.  Telemetry this admission has demonstrated Mobitz I heart block and EP has been asked to evaluate.   Hgb was found to be 5.7 on admission and he has since been transfused.  He has been seen by GI who feels that with advanced age and co-morbidities, no further work up is indicated at this time.   He currently denies chest pain, shortness of breath, recent fevers, chills, nausea or vomiting. He has not had dizziness, or syncope.   Past Medical History  Diagnosis Date  . Diabetes mellitus without complication ([place])   . Hypertension   . Arthritis   . High cholesterol   . Peripheral vascular disease ([place])   . Hyperlipidemia   . Aortic stenosis   . Atrial fibrillation [facility])      Surgical History:  Past Surgical History  Procedure Laterality Date  . Appendectomy    . Irrigation and debridement foot Right 01/23/2015    Procedure: IRRIGATION AND DEBRIDEMENT FOOT;  Surgeon: Samara Deist, DPM;  Location: [facility] ORS;  Service: Podiatry;  Laterality:  Right;  . Peripheral vascular catheterization N/A 01/27/2015    Procedure: Abdominal Aortogram w/Lower Extremity;  Surgeon: Katha Cabal, MD;  Location: [place] CV LAB;  Service: Cardiovascular;  Laterality: N/A;  . Peripheral vascular catheterization  01/27/2015    Procedure: Lower Extremity Intervention;  Surgeon: Katha Cabal, MD;  Location: [place] CV LAB;  Service: Cardiovascular;;  . Amputation Right 01/30/2015    Procedure: AMPUTATION FOOT/ TRANSMETATARSAL;  Surgeon: Samara Deist, DPM;  Location: [facility] ORS;  Service: Podiatry;  Laterality: Right;  . Amputation Right 04/10/2015    Procedure: AMPUTATION BELOW KNEE;  Surgeon: Katha Cabal, MD;  Location: [facility] ORS;  Service: Vascular;  Laterality: Right;     Prescriptions prior to admission  Medication Sig Dispense Refill Last Dose  . acetaminophen (TYLENOL) 325 MG tablet Take 650 mg by mouth every 4 (four) hours as needed for mild pain or fever.   06/06/2015  . apixaban (ELIQUIS) 5 MG TABS tablet Take 5 mg by mouth 2 (two) times daily.   06/08/2015 at 0900  . aspirin EC 81 MG tablet Take 81 mg by mouth every other day.    06/07/2015 at 0900  . DULoxetine (CYMBALTA) 20 MG capsule Take 20 mg by mouth daily.   06/08/2015 at 0900  . furosemide (LASIX) 40 MG tablet Take 40 mg by mouth daily as needed for fluid. Reported on 01/23/2015   unknown  . HYDROcodone-acetaminophen (NORCO/VICODIN) 5-325 MG tablet Take 1 tablet by mouth every 6 (  six) hours as needed for moderate pain. 30 tablet 0 06/05/2015 at 0343  . metFORMIN (GLUMETZA) 500 MG (MOD) 24 hr tablet Take 500 mg by mouth daily with breakfast.   06/08/2015 at 0800  . polyvinyl alcohol (LIQUIFILM TEARS) 1.4 % ophthalmic solution Place 1 drop into both eyes 4 (four) times daily as needed for dry eyes (and irritation).    05/27/2015  . Probiotic Product (PROBIOTIC DAILY PO) Take 1 capsule by mouth at bedtime.   06/07/2015 at 2100  . sennosides-docusate sodium (SENOKOT-S) 8.6-50 MG  tablet Take 2 tablets by mouth daily.   06/08/2015 at 0900  . simvastatin (ZOCOR) 20 MG tablet Take 20 mg by mouth at bedtime.    06/07/2015 at 2100    Inpatient Medications:  . [START ON 06/10/2015] Chlorhexidine Gluconate Cloth  6 each Topical Q0600  . DULoxetine  20 mg Oral Daily  . insulin aspart  0-9 Units Subcutaneous TID WC  . mupirocin ointment  1 application Nasal BID  . pantoprazole (PROTONIX) IV  40 mg Intravenous Q24H  . senna-docusate  2 tablet Oral Daily  . simvastatin  20 mg Oral QHS  . sodium chloride flush  3 mL Intravenous Q12H  . terbinafine  250 mg Oral Daily    Allergies: No Known Allergies  Social History   Social History  . Marital Status: Widowed    Spouse Name: N/A  . Number of Children: N/A  . Years of Education: N/A   Occupational History  . Not on file.   Social History Main Topics  . Smoking status: Former Research scientist (life sciences)  . Smokeless tobacco: Never Used  . Alcohol Use: No  . Drug Use: No  . Sexual Activity: Not on file   Other Topics Concern  . Not on file   Social History Narrative     Family History  Problem Relation Age of Onset  . Prostate cancer Father   . Heart attack Father   . Diabetes Mother      Review of Systems: All other systems reviewed and are otherwise negative except as noted above.  Physical Exam: Filed Vitals:   06/09/15 0100 06/09/15 0602 06/09/15 1152 06/09/15 1358  BP:  131/46 152/48 133/50  Pulse:  73 78 87  Temp:  98.6 F (37 C) 98.6 F (37 C) 98 F (36.7 C)  TempSrc:  Oral Oral Oral  Resp:  16 17   Weight: 140 lb 6.4 oz (63.685 kg)     SpO2:  93% 95% 99%    GEN- The patient is elderly and chronically ill appearing, alert and oriented x 3 today.   HEENT: normocephalic, atraumatic; sclera clear, conjunctiva pink; hearing intact; oropharynx clear; neck supple  Lungs- Clear to ausculation bilaterally, normal work of breathing.  No wheezes, rales, rhonchi Heart- Regular rate and rhythm with a high pitched MR  murmur GI- soft, non-tender, non-distended, bowel sounds present  Extremities- no clubbing, cyanosis, or edema, R BKA MS- no significant deformity or atrophy Skin- warm and dry, no rash or lesion Psych- euthymic mood, full affect Neuro- strength and sensation are intact, and he is awake and alert.  Labs:   Lab Results  Component Value Date   WBC 10.7* 06/09/2015   HGB 9.1* 06/09/2015   HCT 31.9* 06/09/2015   MCV 68.3* 06/09/2015   PLT 288 06/09/2015    Recent Labs Lab 06/08/15 1208 06/09/15 0457  NA 138 139  K 3.6 3.4*  CL 107 109  CO2 23 21*  BUN  12 9  CREATININE 0.73 0.73  CALCIUM 8.5* 8.1*  PROT 6.9  --   BILITOT 1.4*  --   ALKPHOS 68  --   ALT 16*  --   AST 24  --   GLUCOSE 126* 90      Radiology/Studies: Ct Head Wo Contrast 06/08/2015  CLINICAL DATA:  Right-sided facial droop. EXAM: CT HEAD WITHOUT CONTRAST TECHNIQUE: Contiguous axial images were obtained from the base of the skull through the vertex without intravenous contrast. COMPARISON:  01/23/2015 FINDINGS: There is a hyperdense, partially calcified anterior parafalcine mass likely representing a meningioma unchanged compared with the prior exam. There is no evidence of mass effect, midline shift, or extra-axial fluid collections. There is no evidence of a space-occupying lesion or intracranial hemorrhage. There is no evidence of a cortical-based area of acute infarction. There is generalized cerebral atrophy. There is periventricular white matter low attenuation likely secondary to microangiopathy. The ventricles and sulci are appropriate for the patient's age. The basal cisterns are patent. Visualized portions of the orbits are unremarkable. The visualized portions of the paranasal sinuses and mastoid air cells are unremarkable. Cerebrovascular atherosclerotic calcifications are noted. The osseous structures are unremarkable. IMPRESSION: 1. No acute intracranial pathology. 2. Chronic microvascular disease and  cerebral atrophy. Electronically Signed   By: Kathreen Devoid   On: 06/08/2015 12:23   Mr Brain Wo Contrast 06/08/2015  CLINICAL DATA:  [place] and hit head 3 days ago.  Right facial droop EXAM: MRI HEAD WITHOUT CONTRAST MRA HEAD WITHOUT CONTRAST TECHNIQUE: Multiplanar, multiecho pulse sequences of the brain and surrounding structures were obtained without intravenous contrast. Angiographic images of the head were obtained using MRA technique without contrast. COMPARISON:  CT head 06/08/2015 FINDINGS: MRI HEAD FINDINGS The patient was not able to complete the study. Diffusion-weighted imaging only was obtained followed by MRA. No other imaging of the brain. MRA neck not performed. Negative for acute infarct. There is generalized atrophy with ventricular enlargement. 12 mm area of diffusion hyperintensity to the left of the spinal cord at approximately the C1 level. This appears separate from the spinal cord. Review of the recent CT of the face does not show a definite mass lesion in this area. This would require further imaging to determine the etiology and location of this abnormality. MRA HEAD FINDINGS Both vertebral arteries patent to the basilar. AICA patent bilaterally. Superior cerebellar and posterior cerebral arteries patent bilaterally. Left posterior communicating artery patent. Cavernous carotid widely patent bilaterally. Anterior and middle cerebral arteries patent bilaterally without stenosis. Negative for cerebral aneurysm. IMPRESSION: Limited imaging of the brain.  No acute infarct. 12 mm area of diffusion hyperintensity within the spinal canal to the left of the cord at the C1 level uncertain etiology. When the patient is able to hold still, MRI of the cervical spine with contrast suggested for further elucidation Negative MRA of the head. Electronically Signed   By: Franchot Gallo M.D.   On: 06/08/2015 21:00   [id number] I heart block, rate 80, RBBB   TELEMETRY: Mobitz I heart block    Assessment/Plan: 1.  Mobitz I Asymptomatic with no significant pauses or advanced heart block noted No further work up necessary at this time  2.  ?Paroxysmal atrial fibrillation This was first diagnosed during hospitalization at [facility] 01/2015.  Review of telemetry strips and EKG from that admission that are available in EPIC all demonstrate wandering atrial pacemaker/Mobitz I heart block. I do not see atrial fibrillation on the strips that we have  available to review.  Would discontinue Eliquis with no clearly documented AF and GI bleed this admission  3.  Aortic stenosis Per Dr Saralyn Pilar  4.  Facial droop Per neurology  5.  Anemia and heme-positive stools Per GI, no work up planned at this time   Dr Lovena Le to see later today   Signed, Chanetta Marshall, NP 06/09/2015 2:15 PM  EP Attending  Patient seen and examined. I concur with the findings as noted by Chanetta Marshall, NP-C. The patient was admitted with multiple problems and has been transfused and feels better. We are asked to see regarding heart block and atrial fib in the setting of probable GI bleeding while on an [place]. He denies anginal symptoms although he did experience SOB when he was anemic. Exam reveals a pleasant elderly man, NAD; CV - RRR with a soft systolic murmur, lungs reveal basilar rales and extremities demonstrate no edema. Tele - NSR with AVWB. ECG - NSR with AVWB and RBBB. A/P 1. AVWB - he appears to be asymptomatic. No indication for PPM at this time. 2. PAF - it is unclear whether or not he has atrial fib. Regardless, he is not a candidate for systemic anti-coagulation.  3. Anemia - GI is the likely source. I have recommended he stop his [place] and for now would hold ASA. If his anemia resolves, then re-initiation of ASA 81 mg would be a consideration. 4. Peripheral vascular disease - he is s/p amuptation. Will follow and restart ASA if H/H is stable.  Mikle Bosworth.D.

## 2015-06-09 NOTE — Progress Notes (Addendum)
PROGRESS NOTE                                                                                                                                                                                                             Patient Demographics:    Spencer [name], is a 82 y.o. male, DOB - Aug 22, 1917, [id number]  Admit date - 06/08/2015   Admitting Physician Waldemar Dickens, MD  Outpatient Primary MD for the patient is [name], MD  LOS - 1  Chief Complaint  Patient presents with  . Facial Droop  . Weakness       Brief Narrative   82 y.o. male with a Past Medical History of diabetes, HTN, HLD, PVD, A. fib who presents With generalized weakness and right facial droop, workup significant for anemia hemoglobin of 5.7, FOBT positive, patient on  Eliquis for A. Fib, MRI with no acute stroke.   Subjective:    Spencer [name] today has, No headache, No chest pain, No abdominal pain - No Nausea,    Assessment  & Plan :    Active Problems:   Anemia   Facial droop   Hypertension   Aortic stenosis   Peripheral vascular disease (HCC)   Onychomycosis of left great toe   Diabetes mellitus, type 2 (HCC)   Stroke-like symptoms   Diabetes mellitus with complication (HCC)   Fall   Bleeding gastrointestinal   Anemia due to GI blood loss  Right facial droop - Intermittent slurred speech and confusion - MRI brain with no acute infarct - 2-D echo pending - carotid doppler  Doppler pending - LDL 33 - Neurology consult appreciated, felt to be secondary to missing partial dentures - MRA neck with without contrast pending for further evaluation of 12 mm area of diffusion hyperintensity within the spinal canal to the left of the cord at the C1 level uncertain etiology - Currently holding aspirin and anticoagulation giving GI bleed - Discussed with daughter, patient is high risk for GI bleed on full anticoagulation, will hold for now, very likely we'll  discharge on aspirin only.  Anemia of blood loss/GI bleed - He is on aspirin and Eliquis at home ,Patient with hemoglobin of 5.7 on admission, FOBT +, on Eliquis. - Continue to hold aspirin and Eliquis. - GI consult greatly appreciated, not a candidate for invasive  workup. - Received units PRBC, hemoglobin this a.m. is 9.1, Monitor closely and transfuse as needed - Opinion with PPI  Atrial fibrillation ? - This was first diagnosed during hospitalization at [facility] January 2017 - Strips reviewed by Delfino Lovett physiology cardiology, felt to be secondary to wandering atrial pacemaker/Mobitz 1 heart block, evidence of atrial fibrillation. - Continue to hold liquids with no clear documented atrial fibrillation in the setting of GI bleed  Diabetes, type 2. . -Hold home Metformin -CBGs, SSI - sensitive -A1c 7.6  Aortic stenosis, -  No echocardiolgram / records in Epic. Await Echo to confirm severity.   Hx of depression,  -continue cymbalta  Hyperlipidemia . 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 He has LLE ulcers (shin), healing. Recently had [employer] on.  - Will replace [employer].   Left great toe onychomycosis / Subungal hematoma from trauma.  - Will treat with Lamisil 250mg  daily for 12 weeks.  - needs outpatient LFTs in one month  Right inner thigh chaffing.  - silvadine once today  Protein calorie malnutrition, 20 pound weight loss since March per family. Appetite poor.  -nutrition consult   Code Status : DO NOT RESUSCITATE  Family Communication  : Daughter at bedside  Disposition Plan  : Pending PT evaluation  Consults  :  Cardiology, neurology, GI  Procedures  : None  DVT Prophylaxis  :   SCDs   Lab Results  Component Value Date   PLT 288 06/09/2015    Antibiotics  :   Anti-infectives    Start     Dose/Rate Route Frequency Ordered Stop   06/08/15 1830  terbinafine (LAMISIL) tablet 250  mg     250 mg Oral Daily 06/08/15 1736          Objective:   Filed Vitals:   06/09/15 0100 06/09/15 0602 06/09/15 1152 06/09/15 1358  BP:  131/46 152/48 133/50  Pulse:  73 78 87  Temp:  98.6 F (37 C) 98.6 F (37 C) 98 F (36.7 C)  TempSrc:  Oral Oral Oral  Resp:  16 17   Weight: 63.685 kg (140 lb 6.4 oz)     SpO2:  93% 95% 99%    Wt Readings from Last 3 Encounters:  06/09/15 63.685 kg (140 lb 6.4 oz)  04/10/15 71.804 kg (158 lb 4.8 oz)  04/06/15 72.576 kg (160 lb)     Intake/Output Summary (Last 24 hours) at 06/09/15 1517 Last data filed at 06/09/15 1357  Gross per 24 hour  Intake 636.83 ml  Output    200 ml  Net 436.83 ml     Physical Exam  Awake Alert Minimal Right facial droop, but sensation intact bilaterally, tongue is midline, Supple Neck,No JVD,  Symmetrical Chest wall movement, Good air movement bilaterally RRR,No Gallops,SEM+, No Parasternal Heave +ve B.Sounds, Abd Soft, No tenderness,  No Cyanosis, Clubbing or edema, right BKA    Data Review:    CBC  Recent Labs Lab 06/08/15 1208 06/09/15 0457  WBC 13.5* 10.7*  HGB 5.7* 9.1*  HCT 23.2* 31.9*  PLT 382 288  MCV 62.9* 68.3*  MCH 15.4* 19.5*  MCHC 24.6* 28.5*  RDW 19.6* 22.7*  LYMPHSABS 1.4  --   MONOABS 1.2*  --   EOSABS 0.0  --   BASOSABS 0.0  --     Chemistries   Recent Labs Lab 06/08/15 1208 06/09/15 0457  NA 138 139  K 3.6  3.4*  CL 107 109  CO2 23 21*  GLUCOSE 126* 90  BUN 12 9  CREATININE 0.73 0.73  CALCIUM 8.5* 8.1*  AST 24  --   ALT 16*  --   ALKPHOS 68  --   BILITOT 1.4*  --    ------------------------------------------------------------------------------------------------------------------  Recent Labs  06/09/15 0457  CHOL 66  HDL 21*  LDLCALC 33  TRIG 62  CHOLHDL 3.1    Lab Results  Component Value Date   HGBA1C 7.6* 06/08/2015    ------------------------------------------------------------------------------------------------------------------ No results for input(s): TSH, T4TOTAL, T3FREE, THYROIDAB in the last 72 hours.  Invalid input(s): FREET3 ------------------------------------------------------------------------------------------------------------------ No results for input(s): VITAMINB12, FOLATE, FERRITIN, TIBC, IRON, RETICCTPCT in the last 72 hours.  Coagulation profile No results for input(s): INR, PROTIME in the last 168 hours.  No results for input(s): DDIMER in the last 72 hours.  Cardiac Enzymes No results for input(s): CKMB, TROPONINI, MYOGLOBIN in the last 168 hours.  Invalid input(s): CK ------------------------------------------------------------------------------------------------------------------ No results found for: BNP  Inpatient Medications  Scheduled Meds: . [START ON 06/10/2015] Chlorhexidine Gluconate Cloth  6 each Topical Q0600  . DULoxetine  20 mg Oral Daily  . feeding supplement (ENSURE ENLIVE)  237 mL Oral BID BM  . insulin aspart  0-9 Units Subcutaneous TID WC  . mupirocin ointment  1 application Nasal BID  . pantoprazole (PROTONIX) IV  40 mg Intravenous Q12H  . senna-docusate  2 tablet Oral Daily  . simvastatin  20 mg Oral QHS  . sodium chloride flush  3 mL Intravenous Q12H  . terbinafine  250 mg Oral Daily   Continuous Infusions:  PRN Meds:.acetaminophen **OR** acetaminophen, HYDROcodone-acetaminophen, LORazepam, ondansetron **OR** ondansetron (ZOFRAN) IV, polyvinyl alcohol, traZODone  Micro Results Recent Results (from the past 240 hour(s))  MRSA PCR Screening     Status: Abnormal   Collection Time: 06/09/15  6:06 AM  Result Value Ref Range Status   MRSA by PCR POSITIVE (A) NEGATIVE Final    Comment:        The GeneXpert MRSA Assay (FDA approved for NASAL specimens only), is one component of a comprehensive MRSA colonization surveillance program. It is  not intended to diagnose MRSA infection nor to guide or monitor treatment for MRSA infections. RESULT CALLED TO, READ BACK BY AND VERIFIED WITH: Regino Schultze RN 10:20 06/09/15 ([email or web address])     Radiology Reports Ct Head Wo Contrast  06/08/2015  CLINICAL DATA:  Right-sided facial droop. EXAM: CT HEAD WITHOUT CONTRAST TECHNIQUE: Contiguous axial images were obtained from the base of the skull through the vertex without intravenous contrast. COMPARISON:  01/23/2015 FINDINGS: There is a hyperdense, partially calcified anterior parafalcine mass likely representing a meningioma unchanged compared with the prior exam. There is no evidence of mass effect, midline shift, or extra-axial fluid collections. There is no evidence of a space-occupying lesion or intracranial hemorrhage. There is no evidence of a cortical-based area of acute infarction. There is generalized cerebral atrophy. There is periventricular white matter low attenuation likely secondary to microangiopathy. The ventricles and sulci are appropriate for the patient's age. The basal cisterns are patent. Visualized portions of the orbits are unremarkable. The visualized portions of the paranasal sinuses and mastoid air cells are unremarkable. Cerebrovascular atherosclerotic calcifications are noted. The osseous structures are unremarkable. IMPRESSION: 1. No acute intracranial pathology. 2. Chronic microvascular disease and cerebral atrophy. Electronically Signed   By: Kathreen Devoid   On: 06/08/2015 12:23   Mr Brain Wo Contrast  06/08/2015  CLINICAL DATA:  [place] and hit head 3 days ago.  Right facial droop EXAM: MRI HEAD WITHOUT CONTRAST MRA HEAD WITHOUT CONTRAST TECHNIQUE: Multiplanar, multiecho pulse sequences of the brain and surrounding structures were obtained without intravenous contrast. Angiographic images of the head were obtained using MRA technique without contrast. COMPARISON:  CT head 06/08/2015 FINDINGS: MRI HEAD FINDINGS The patient was not  able to complete the study. Diffusion-weighted imaging only was obtained followed by MRA. No other imaging of the brain. MRA neck not performed. Negative for acute infarct. There is generalized atrophy with ventricular enlargement. 12 mm area of diffusion hyperintensity to the left of the spinal cord at approximately the C1 level. This appears separate from the spinal cord. Review of the recent CT of the face does not show a definite mass lesion in this area. This would require further imaging to determine the etiology and location of this abnormality. MRA HEAD FINDINGS Both vertebral arteries patent to the basilar. AICA patent bilaterally. Superior cerebellar and posterior cerebral arteries patent bilaterally. Left posterior communicating artery patent. Cavernous carotid widely patent bilaterally. Anterior and middle cerebral arteries patent bilaterally without stenosis. Negative for cerebral aneurysm. IMPRESSION: Limited imaging of the brain.  No acute infarct. 12 mm area of diffusion hyperintensity within the spinal canal to the left of the cord at the C1 level uncertain etiology. When the patient is able to hold still, MRI of the cervical spine with contrast suggested for further elucidation Negative MRA of the head. Electronically Signed   By: Franchot Gallo M.D.   On: 06/08/2015 21:00   Mr Lovenia Kim  06/08/2015  CLINICAL DATA:  [place] and hit head 3 days ago.  Right facial droop EXAM: MRI HEAD WITHOUT CONTRAST MRA HEAD WITHOUT CONTRAST TECHNIQUE: Multiplanar, multiecho pulse sequences of the brain and surrounding structures were obtained without intravenous contrast. Angiographic images of the head were obtained using MRA technique without contrast. COMPARISON:  CT head 06/08/2015 FINDINGS: MRI HEAD FINDINGS The patient was not able to complete the study. Diffusion-weighted imaging only was obtained followed by MRA. No other imaging of the brain. MRA neck not performed. Negative for acute infarct. There is  generalized atrophy with ventricular enlargement. 12 mm area of diffusion hyperintensity to the left of the spinal cord at approximately the C1 level. This appears separate from the spinal cord. Review of the recent CT of the face does not show a definite mass lesion in this area. This would require further imaging to determine the etiology and location of this abnormality. MRA HEAD FINDINGS Both vertebral arteries patent to the basilar. AICA patent bilaterally. Superior cerebellar and posterior cerebral arteries patent bilaterally. Left posterior communicating artery patent. Cavernous carotid widely patent bilaterally. Anterior and middle cerebral arteries patent bilaterally without stenosis. Negative for cerebral aneurysm. IMPRESSION: Limited imaging of the brain.  No acute infarct. 12 mm area of diffusion hyperintensity within the spinal canal to the left of the cord at the C1 level uncertain etiology. When the patient is able to hold still, MRI of the cervical spine with contrast suggested for further elucidation Negative MRA of the head. Electronically Signed   By: Franchot Gallo M.D.   On: 06/08/2015 21:00   Ct Maxillofacial Wo Cm  06/08/2015  CLINICAL DATA:  82 year old male with history of trauma from a fall 3 days ago with laceration in the left frontal region. EXAM: CT MAXILLOFACIAL WITHOUT CONTRAST TECHNIQUE: Multidetector CT imaging of the maxillofacial structures was performed. Multiplanar CT image reconstructions were also generated. A small  metallic BB was placed on the right temple in order to reliably differentiate right from left. COMPARISON:  Head CT 06/08/2015. FINDINGS: No acute displaced facial bone fractures. Specifically, pterygoid plates are intact. Mandible is intact, and the mandibular condyles are located bilaterally. Bilateral globes and retro-orbital soft tissues are grossly normal in appearance. Soft tissue swelling beneath the nose and anterior to the right side of the maxilla.  Visualized intracranial contents demonstrate severe cerebral atrophy. Paranasal sinuses and mastoids are well pneumatized. IMPRESSION: 1. No evidence of significant acute traumatic injury to the facial bones. Electronically Signed   By: Vinnie Langton M.D.   On: 06/08/2015 13:14     Sai Zinn M.D on 06/09/2015 at 3:17 PM  Between 7am to 7pm - Pager - [phone]  After 7pm go to [email or web address] - password [facility]  [facility] -  Office  [phone]

## 2015-06-09 NOTE — Evaluation (Signed)
Clinical/Bedside Swallow Evaluation Patient Details  Name: Spencer [name] MRN: [id number] Date of Birth: 11/21/1917  Today's Date: 06/09/2015 Time: SLP Start Time (ACUTE ONLY): 1057 SLP Stop Time (ACUTE ONLY): 1114 SLP Time Calculation (min) (ACUTE ONLY): 17 min  Past Medical History:  Past Medical History  Diagnosis Date  . Diabetes mellitus without complication ([place])   . Hypertension   . Arthritis   . High cholesterol   . Peripheral vascular disease ([place])   . Hyperlipidemia   . Aortic stenosis   . Atrial fibrillation [facility])    Past Surgical History:  Past Surgical History  Procedure Laterality Date  . Appendectomy    . Irrigation and debridement foot Right 01/23/2015    Procedure: IRRIGATION AND DEBRIDEMENT FOOT;  Surgeon: Samara Deist, DPM;  Location: [facility] ORS;  Service: Podiatry;  Laterality: Right;  . Peripheral vascular catheterization N/A 01/27/2015    Procedure: Abdominal Aortogram w/Lower Extremity;  Surgeon: Katha Cabal, MD;  Location: [place] [facility];  Service: Cardiovascular;  Laterality: N/A;  . Peripheral vascular catheterization  01/27/2015    Procedure: Lower Extremity Intervention;  Surgeon: Katha Cabal, MD;  Location: [place] [facility];  Service: Cardiovascular;;  . Amputation Right 01/30/2015    Procedure: AMPUTATION FOOT/ TRANSMETATARSAL;  Surgeon: Samara Deist, DPM;  Location: [facility] ORS;  Service: Podiatry;  Laterality: Right;  . Amputation Right 04/10/2015    Procedure: AMPUTATION BELOW KNEE;  Surgeon: Katha Cabal, MD;  Location: [facility] ORS;  Service: Vascular;  Laterality: Right;   HPI:  Spencer [name] is a 82 y.o. male with multiple medical problems not limited to diabetes, hypertension, aortic stenosis, peripheral last disease and atrial fibrillation. 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 Patient apparently slipped out of his wheelchair, fell and struck his head on Friday but was not brought to the ED. Since January family has noticed  intermittent confusion, intermittent slurred speech but right facial droop is new. MRI shows no acute CVA. Pt has had some anemia and hemoccult-positive stools, GI will not pursue any procedures due to risk.    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 No significant pocketing on the right, likely due to missing dentition on that side as well. Educated dtr to basic precautions. Pt may initiate a regular diet and thin liquids. No SLP f/u needed.     Aspiration Risk  Mild aspiration risk    Diet Recommendation Regular;Thin liquid   Liquid Administration via: Cup;Straw Medication Administration: Whole meds with liquid Supervision: Patient able to self feed;Intermittent supervision to cue for compensatory strategies Compensations: Minimize environmental distractions;Slow rate;Small sips/bites Postural Changes: Seated upright at 90 degrees    Other  Recommendations     Follow up Recommendations    none     Swallow Study   General HPI: Spencer [name] is a 82 y.o. male with multiple medical problems not limited to diabetes, hypertension, aortic stenosis, peripheral last disease and atrial fibrillation. 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 Patient apparently slipped out of his wheelchair, fell and struck his head on Friday but was not brought to the ED. Since January family has noticed intermittent confusion, intermittent slurred speech but right facial droop is new. MRI shows no acute CVA. Pt has had some anemia and hemoccult-positive stools, GI will not pursue any procedures due to risk.  Type of Study: Bedside Swallow Evaluation  Diet Prior to this Study: NPO Temperature Spikes Noted: No Respiratory Status: Room air History of Recent Intubation: No Behavior/Cognition: Alert;Cooperative;Pleasant mood Oral Cavity Assessment: Within  Functional Limits Oral Care Completed by SLP: No Oral Cavity - Dentition: Missing dentition Vision: Functional for self-feeding Self-Feeding Abilities: Able to feed self Patient Positioning: Upright in bed Baseline Vocal Quality: Normal Volitional Cough: Strong Volitional Swallow: Able to elicit    Oral/Motor/Sensory Function Overall Oral Motor/Sensory Function: Mild impairment Facial ROM: Reduced right;Suspected CN VII (facial) dysfunction Facial Symmetry: Abnormal symmetry right;Suspected CN VII (facial) dysfunction Facial Strength: Reduced right;Suspected CN VII (facial) dysfunction Facial Sensation: Reduced right;Suspected CN V (Trigeminal) dysfunction Lingual ROM: Within Functional Limits Lingual Symmetry: Within Functional Limits Lingual Strength: Within Functional Limits Lingual Sensation: Within Functional Limits Velum: Within Functional Limits Mandible: Within Functional Limits   Ice Chips Ice chips: Not tested   Thin Liquid Thin Liquid: Impaired Presentation: Cup;Straw;Self Fed Oral Phase Impairments: Reduced labial seal Oral Phase Functional Implications: Right anterior spillage;Left anterior spillage    Nectar Thick Nectar Thick Liquid: Not tested   Honey Thick Honey Thick Liquid: Not tested   Puree Puree: Within functional limits Presentation: Self Fed;Spoon   Solid   GO   Solid: Within functional limits Presentation: [place] Spencer Whisnant, MA CCC-SLP [phone]  Riggins Cisek, Katherene Ponto 06/09/2015,11:17 AM

## 2015-06-09 NOTE — Care Management Note (Signed)
Case Management Note  Patient Details  Name: Spencer [name] MRN: [id number] Date of Birth: 1917/02/27  Subjective/Objective:                    Action/Plan:  Patient from [facility] , await PT/OT evals  Expected Discharge Date:                  Expected Discharge Plan:     In-House Referral:  Clinical Social Work  Discharge planning Services     Post Acute Care Choice:    Choice offered to:     DME Arranged:    DME Agency:     HH Arranged:    [place] Agency:     Status of Service:  In process, will continue to follow  Medicare Important Message Given:    Date Medicare IM Given:    Medicare IM give by:    Date Additional Medicare IM Given:    Additional Medicare Important Message give by:     If discussed at [place] of Stay Meetings, dates discussed:    Additional Comments:  Marilu Favre, RN 06/09/2015, 7:36 AM

## 2015-06-09 NOTE — Progress Notes (Signed)
Initial Nutrition Assessment  DOCUMENTATION CODES:   Not applicable  INTERVENTION:   -Continue Ensure Enlive po BID, each supplement provides 350 kcal and 20 grams of protein  NUTRITION DIAGNOSIS:   Predicted suboptimal nutrient intake related to chronic illness as evidenced by percent weight loss.  GOAL:   Patient will meet greater than or equal to 90% of their needs  MONITOR:   PO intake, Supplement acceptance, Labs, Weight trends, Skin, I & O's  REASON FOR ASSESSMENT:   Consult Assessment of nutrition requirement/status  ASSESSMENT:   Spencer [name] is a 82 y.o. male with a Past Medical History of diabetes, HTN, HLD, PVD, A. fib who presents with symptomatic anemia, FOBT positive and possible stroke with right facial droop.   Pt admitted with rt facial droop.   Pt is a resident of [facility]. Per chart review, pt has experienced a general decline in health over the past 3 months, when pt developed a rt foot wound due to PVD. Per Care Everywhere records, pt was followed for the wound center for a period of time (had a wound vac to rt foot), and eventually underwent rt BKA in April.   Attempted to assess pt x 2, however, pt in with multiple medical providers or therapy at time of visit. Unable to complete Nutrition-Focused physical exam at this time.   Wt hx reviewed. Noted pt has experienced a 20% wt loss over the past year, which is significant for time frame.  Case discussed with RN. She confirms pt with poor appetite PTA and was just advanced to a regular diet; pt was consuming graham crackers and juice earlier this shift and tolerating textures well. Ensure supplement ordered; RD will continue order to optimize PO intake. RD suspect pt with malnutrition, however, unable to identify at this time.   Labs reviewed.   Diet Order:  Diet regular Room service appropriate?: Yes; Fluid consistency:: Thin  Skin:  Reviewed, no issues  Last BM:  PTA  Height:    Ht Readings from Last 1 Encounters:  04/10/15 5\' 5"  (1.651 m)    Weight:   Wt Readings from Last 1 Encounters:  06/09/15 140 lb 6.4 oz (63.685 kg)    Ideal Body Weight:  53.13 kg  BMI:  Body mass index is 23.36 kg/(m^2). (Adjust BMI: 25).   Estimated Nutritional Needs:   Kcal:  1500-1700  Protein:  75-85 grams  Fluid:  1.5-1.7 L  EDUCATION NEEDS:   No education needs identified at this time  Sotiria Keast A. Jimmye Norman, RD, LDN, CDE Pager: [phone] After hours Pager: [phone]

## 2015-06-09 NOTE — Consult Note (Signed)
[facility] Consultation Note  Referring Provider: Dr. Phillips Climes [facility]) Primary Care Physician:  [name], MD  Reason for Consultation:  Anemia, hemoccult-positive stools.  HPI: Spencer [name] is a 82 y.o. male whom we've been asked to see for above reasons.  Patient was in seemingly fairly good health until January, when he developed complications from peripheral vascular disease, had right above-the-knee amputation as well as infection and ischemia to left lower extremities.  He was also, during this time frame, apparently diagnosed with atrial fibrillation and placed on Eliquis.  He has further known history of aortic stenosis.  He was readmitted to hospital for right-facial droop and mental status changes worrisome for acute stroke.  We were asked to see patient for anemia and hemoccult-positive stools.  He is not able to answer questions very coherently today, but daughter denies any history of hematemesis, nausea, vomiting, dysphagia, abdominal pain, overt blood in stools.  She is uncertain whether he's had endoscopy/colonoscopy procedures before, but tells me he has reported having these about 30-40 years ago.   Past Medical History  Diagnosis Date  . Diabetes mellitus without complication ([place])   . Hypertension   . Arthritis   . High cholesterol   . Peripheral vascular disease ([place])   . Hyperlipidemia   . Aortic stenosis   . Atrial fibrillation [facility])     Past Surgical History  Procedure Laterality Date  . Appendectomy    . Irrigation and debridement foot Right 01/23/2015    Procedure: IRRIGATION AND DEBRIDEMENT FOOT;  Surgeon: Samara Deist, DPM;  Location: [facility] ORS;  Service: Podiatry;  Laterality: Right;  . Peripheral vascular catheterization N/A 01/27/2015    Procedure: Abdominal Aortogram w/Lower Extremity;  Surgeon: Katha Cabal, MD;  Location: [place] [facility];  Service: Cardiovascular;  Laterality: N/A;  . Peripheral vascular catheterization   01/27/2015    Procedure: Lower Extremity Intervention;  Surgeon: Katha Cabal, MD;  Location: [place] [facility];  Service: Cardiovascular;;  . Amputation Right 01/30/2015    Procedure: AMPUTATION FOOT/ TRANSMETATARSAL;  Surgeon: Samara Deist, DPM;  Location: [facility] ORS;  Service: Podiatry;  Laterality: Right;  . Amputation Right 04/10/2015    Procedure: AMPUTATION BELOW KNEE;  Surgeon: Katha Cabal, MD;  Location: [facility] ORS;  Service: Vascular;  Laterality: Right;    Prior to Admission medications   Medication Sig Start Date End Date Taking? Authorizing Provider  acetaminophen (TYLENOL) 325 MG tablet Take 650 mg by mouth every 4 (four) hours as needed for mild pain or fever.   Yes Historical Provider, MD  apixaban (ELIQUIS) 5 MG TABS tablet Take 5 mg by mouth 2 (two) times daily.   Yes Historical Provider, MD  aspirin EC 81 MG tablet Take 81 mg by mouth every other day.    Yes Historical Provider, MD  DULoxetine (CYMBALTA) 20 MG capsule Take 20 mg by mouth daily.   Yes Historical Provider, MD  furosemide (LASIX) 40 MG tablet Take 40 mg by mouth daily as needed for fluid. Reported on 01/23/2015   Yes Historical Provider, MD  HYDROcodone-acetaminophen (NORCO/VICODIN) 5-325 MG tablet Take 1 tablet by mouth every 6 (six) hours as needed for moderate pain. 02/02/15  Yes Bettey Costa, MD  metFORMIN (GLUMETZA) 500 MG (MOD) 24 hr tablet Take 500 mg by mouth daily with breakfast.   Yes Historical Provider, MD  polyvinyl alcohol (LIQUIFILM TEARS) 1.4 % ophthalmic solution Place 1 drop into both eyes 4 (four) times daily as needed for dry eyes (  and irritation).    Yes Historical Provider, MD  Probiotic Product (PROBIOTIC DAILY PO) Take 1 capsule by mouth at bedtime.   Yes Historical Provider, MD  sennosides-docusate sodium (SENOKOT-S) 8.6-50 MG tablet Take 2 tablets by mouth daily.   Yes Historical Provider, MD  simvastatin (ZOCOR) 20 MG tablet Take 20 mg by mouth at bedtime.    Yes Historical Provider,  MD    Current Facility-Administered Medications  Medication Dose Route Frequency Provider Last Rate Last Dose  . acetaminophen (TYLENOL) tablet 650 mg  650 mg Oral Q6H PRN Willia Craze, NP       Or  . acetaminophen (TYLENOL) suppository 650 mg  650 mg Rectal Q6H PRN Willia Craze, NP      . DULoxetine (CYMBALTA) DR capsule 20 mg  20 mg Oral Daily Willia Craze, NP      . HYDROcodone-acetaminophen (NORCO/VICODIN) 5-325 MG per tablet 1 tablet  1 tablet Oral Q6H PRN Willia Craze, NP      . insulin aspart (novoLOG) injection 0-9 Units  0-9 Units Subcutaneous TID WC Willia Craze, NP   0 Units at 06/08/15 1800  . ondansetron (ZOFRAN) tablet 4 mg  4 mg Oral Q6H PRN Willia Craze, NP       Or  . ondansetron [facility]) injection 4 mg  4 mg Intravenous Q6H PRN Willia Craze, NP      . polyvinyl alcohol (LIQUIFILM TEARS) 1.4 % ophthalmic solution 1 drop  1 drop Both Eyes QID PRN Willia Craze, NP      . senna-docusate (Senokot-S) tablet 2 tablet  2 tablet Oral Daily Waldemar Dickens, MD   2 tablet at 06/08/15 2113  . simvastatin (ZOCOR) tablet 20 mg  20 mg Oral QHS Willia Craze, NP   20 mg at 06/08/15 2113  . sodium chloride flush (NS) 0.9 % injection 3 mL  3 mL Intravenous Q12H Willia Craze, NP   3 mL at 06/08/15 2114  . terbinafine (LAMISIL) tablet 250 mg  250 mg Oral Daily Willia Craze, NP   250 mg at 06/08/15 2111  . traZODone (DESYREL) tablet 25 mg  25 mg Oral QHS PRN Willia Craze, NP      . white petrolatum (VASELINE) gel             Allergies as of 06/08/2015  . (No Known Allergies)    Family History  Problem Relation Age of Onset  . Prostate cancer Father   . Heart attack Father   . Diabetes Mother     Social History   Social History  . Marital Status: Widowed    Spouse Name: N/A  . Number of Children: N/A  . Years of Education: N/A   Occupational History  . Not on file.   Social History Main Topics  . Smoking status: Former Research scientist (life sciences)   . Smokeless tobacco: Never Used  . Alcohol Use: No  . Drug Use: No  . Sexual Activity: Not on file   Other Topics Concern  . Not on file   Social History Narrative    Review of Systems: Unable to obtain due to patient's altered mental status  Physical Exam: Vital signs in last 24 hours: Temp:  [97.6 F (36.4 C)-99.1 F (37.3 C)] 98.6 F (37 C) (05/30 0602) Pulse Rate:  [68-84] 73 (05/30 0602) Resp:  [15-22] 16 (05/30 0602) BP: (108-140)/(40-62) 131/46 mmHg (05/30 0602) SpO2:  [93 %-100 %] 93 % (  05/30 0602) Weight:  [63.685 kg (140 lb 6.4 oz)] 63.685 kg (140 lb 6.4 oz) (05/30 0100)   General:   Somnolent but arousable, can't answer questions reliably Head:  Normocephalic and atraumatic. Eyes:  Sclera clear, no icterus.   Conjunctiva pale Ears:  Possible mild diminished auditory acuity. Nose:  No deformity, discharge,  or lesions. Mouth:  No deformity or lesions.  Oropharynx pale & moist. Neck:  + JVD noted; Supple; no masses or thyromegaly. Lungs:  Clear throughout to auscultation.   No wheezes, crackles, or rhonchi. No acute distress. Heart: Irregular rhythm, normal rate; harsh IV/VI SEM heart at left upper sternal border and radiates to apex; murmur also audible in abdominal auscultation ABD:  Soft, non-tender, non-distended. No masses, hepatosplenomegaly or hernias noted. Normal bowel sounds, without guarding, and without rebound.     Msk:  Cachectic, atrophy is symmetrical without gross deformities. Normal posture. Pulses:  Normal pulses noted. Extremities:  Right above knee amputation; left lower extremity below knees in wrappings Neurologic:  Alert and  oriented x4;  grossly normal neurologically. Skin:  Scattered ecchymoses, pale, no obvious rash Cervical Nodes:  No significant cervical adenopathy. Psych:  Alert and cooperative. Normal mood and affect.   Lab Results:  Recent Labs  06/08/15 1208 06/09/15 0457  WBC 13.5* 10.7*  HGB 5.7* 9.1*  HCT 23.2* 31.9*   PLT 382 288   BMET  Recent Labs  06/08/15 1208 06/09/15 0457  NA 138 139  K 3.6 3.4*  CL 107 109  CO2 23 21*  GLUCOSE 126* 90  BUN 12 9  CREATININE 0.73 0.73  CALCIUM 8.5* 8.1*   LFT  Recent Labs  06/08/15 1208  PROT 6.9  ALBUMIN 3.0*  AST 24  ALT 16*  ALKPHOS 68  BILITOT 1.4*   PT/INR No results for input(s): LABPROT, INR in the last 72 hours.  Studies/Results:  Impression:  1.  Anemia and Hemoccult-positive stools.  No overt GI tract bleeding. 2.  Concern for recent stroke, on Eliquis. 3.  Aortic stenosis, unclear severity. 4.  Multiple medical problems, as above.  Plan:  1.  Patient is in no state for endoscopic procedures, now or any time in the foreseeable future. 2.  I had a long discussion with patient's daughter, Kathlee Nations, and told her that endoscopic interventions on her father had significant risks, including (1) cardiac decompensation given his seemingly profound aortic stenosis, (2) possible exacerbation or enhancement of recent stroke, (3) increase in GI tract complications especially perforation.  Furthermore, regardless of what is found on endoscopy or colonoscopy (i.e. Worse-case scenario colon cancer), I don't think for all the above reasons patient would be treatment candidate, which should then give [place] pause to even consider doing the procedures in the first place. 3.  In light of the information in #2 above, I would NOT pursue any type of endoscopic evaluation, in absence of acute destabilizing and life-threatening bleeding. 4.  Would consider holding Eliquis if at all clinically feasible given his recent stroke symptoms.  Might also consider IV iron treatments.  Blood transfusions as deemed necessary by primary team. 5.  Pantoprazole 40 mg po qd, now and indefinitely upon discharge. 6.  Advance diet as tolerated. 7.  No GI tract testing planned, now or in the future, in absence of acute overt destabilizing GI tract bleeding. 8.  Will sign-off;  please call with questions; thank you for the consultation.   LOS: 1 day   Dannie Woolen M  06/09/2015, 9:41 AM  Pager [phone] If no answer or after 5 PM call [phone]

## 2015-06-09 NOTE — Evaluation (Signed)
Physical Therapy Evaluation Patient Details Name: Spencer [name] MRN: [id number] DOB: 02/15/1917 Today's Date: 06/09/2015   History of Present Illness  Spencer [name] is a 82 y.o. male with a Past Medical History of diabetes, HTN, HLD, PVD, A. fib who presents with symptomatic anemia, FOBT positive and here to r/o stroke with right facial droop after falling forward out of wheelchair and striking his head.   Clinical Impression  Pt admitted with above diagnosis. Pt currently with functional limitations due to the deficits listed below (see PT Problem List).  Pt will benefit from skilled PT to increase their independence and safety with mobility to allow discharge to SNF when medically stable.        Follow Up Recommendations SNF;Supervision/Assistance - 24 hour    Equipment Recommendations   (to be addressed at next venue)    Recommendations for Other Services       Precautions / Restrictions Precautions Precautions: Fall Precaution Comments: history of falling out of w/c prior to admission.  Restrictions Weight Bearing Restrictions: No Other Position/Activity Restrictions: Rt BKA      Mobility  Bed Mobility Overal bed mobility: Needs Assistance Bed Mobility: Supine to Sit     Supine to sit: Mod assist;+2 for physical assistance;+2 for safety/equipment;HOB elevated     General bed mobility comments: assist with trunk and LEs/pelvis to come to sitting EOB.   Transfers Overall transfer level: Needs assistance Equipment used: None Transfers: Squat Pivot Transfers Sit to Stand: Mod assist Stand pivot transfers: Mod assist;Min assist;+2 safety/equipment Squat pivot transfers: Mod assist     General transfer comment: Squat pivot toward left side. Pt able to reach for armrests to assist.   Ambulation/Gait                Stairs            Wheelchair Mobility    Modified Rankin (Stroke Patients Only)       Balance Overall balance assessment: Needs  assistance Sitting-balance support: No upper extremity supported Sitting balance-Leahy Scale: Good     Standing balance support: Bilateral upper extremity supported Standing balance-Leahy Scale: Poor Standing balance comment: needing UE and physical support.                              Pertinent Vitals/Pain Pain Assessment: No/denies pain    Home Living Family/patient expects to be discharged to:: Skilled nursing facility                 Additional Comments: Pt coming from and planning to return to SNF.     Prior Function Level of Independence: Needs assistance   Gait / Transfers Assistance Needed: Not ambulating, using w/c for mobility. Assistance with transfer to/from w/c. Unclear how transfers performed, stand pivot vs. lift equipment. Pt unable to accurately describe.       Hand Dominance       Extremity/Trunk Assessment   Upper Extremity Assessment: Defer to OT evaluation     LUE Deficits / Details: generalized weakness   Lower Extremity Assessment: LLE deficits/detail   LLE Deficits / Details: able to move actively with bed mobility and transfers.      Communication   Communication: HOH  Cognition Arousal/Alertness: Awake/alert Behavior During Therapy: WFL for tasks assessed/performed Overall Cognitive Status: History of cognitive impairments - at baseline (family present, reports history of cognitive impairment. )  General Comments      Exercises        Assessment/Plan    PT Assessment Patient needs continued PT services  PT Diagnosis Difficulty walking   PT Problem List Decreased strength;Decreased range of motion;Decreased activity tolerance;Decreased balance;Decreased mobility  PT Treatment Interventions DME instruction;Gait training;Functional mobility training;Therapeutic activities;Therapeutic exercise;Balance training;Patient/family education   PT Goals (Current goals can be found in the Care  Plan section) Acute Rehab PT Goals Patient Stated Goal: get back to where he was before coming to the hospital PT Goal Formulation: With patient Time For Goal Achievement: 06/23/15 Potential to Achieve Goals: Good    Frequency Min 2X/week   Barriers to discharge        Co-evaluation PT/OT/SLP Co-Evaluation/Treatment: Yes Reason for Co-Treatment: For patient/therapist safety PT goals addressed during session: Mobility/safety with mobility OT goals addressed during session: ADL's and self-care       End of Session Equipment Utilized During Treatment: Gait belt Activity Tolerance: Patient tolerated treatment well Patient left: in chair;with call bell/phone within reach;with chair alarm set;with family/visitor present Nurse Communication: Mobility status         Time: 1222-1258 PT Time Calculation (min) (ACUTE ONLY): 36 min   Charges:   PT Evaluation $PT Eval Moderate Complexity: 1 Procedure     PT G Codes:        Cassell Clement, PT, CSCS Pager [phone] Office [phone]  06/09/2015, 3:07 PM

## 2015-06-10 ENCOUNTER — Inpatient Hospital Stay (HOSPITAL_COMMUNITY): Payer: Medicare Other

## 2015-06-10 DIAGNOSIS — I350 Nonrheumatic aortic (valve) stenosis: Secondary | ICD-10-CM

## 2015-06-10 DIAGNOSIS — R2990 Unspecified symptoms and signs involving the nervous system: Secondary | ICD-10-CM

## 2015-06-10 DIAGNOSIS — B351 Tinea unguium: Secondary | ICD-10-CM

## 2015-06-10 DIAGNOSIS — K922 Gastrointestinal hemorrhage, unspecified: Secondary | ICD-10-CM

## 2015-06-10 DIAGNOSIS — E118 Type 2 diabetes mellitus with unspecified complications: Secondary | ICD-10-CM

## 2015-06-10 DIAGNOSIS — I739 Peripheral vascular disease, unspecified: Secondary | ICD-10-CM

## 2015-06-10 DIAGNOSIS — R29810 Facial weakness: Principal | ICD-10-CM

## 2015-06-10 DIAGNOSIS — I159 Secondary hypertension, unspecified: Secondary | ICD-10-CM

## 2015-06-10 DIAGNOSIS — I4891 Unspecified atrial fibrillation: Secondary | ICD-10-CM

## 2015-06-10 DIAGNOSIS — D500 Iron deficiency anemia secondary to blood loss (chronic): Secondary | ICD-10-CM

## 2015-06-10 LAB — URINE CULTURE
Culture: NO GROWTH
Special Requests: NORMAL

## 2015-06-10 LAB — CBC
HCT: 28 % — ABNORMAL LOW (ref 39.0–52.0)
Hemoglobin: 8 g/dL — ABNORMAL LOW (ref 13.0–17.0)
MCH: 19.2 pg — AB (ref 26.0–34.0)
MCHC: 28.6 g/dL — ABNORMAL LOW (ref 30.0–36.0)
MCV: 67.1 fL — ABNORMAL LOW (ref 78.0–100.0)
Platelets: 300 10*3/uL (ref 150–400)
RBC: 4.17 MIL/uL — AB (ref 4.22–5.81)
RDW: 22.6 % — ABNORMAL HIGH (ref 11.5–15.5)
WBC: 10.1 10*3/uL (ref 4.0–10.5)

## 2015-06-10 LAB — GLUCOSE, CAPILLARY
GLUCOSE-CAPILLARY: 145 mg/dL — AB (ref 65–99)
Glucose-Capillary: 103 mg/dL — ABNORMAL HIGH (ref 65–99)
Glucose-Capillary: 154 mg/dL — ABNORMAL HIGH (ref 65–99)
Glucose-Capillary: 138 mg/dL — ABNORMAL HIGH (ref 65–99)

## 2015-06-10 LAB — BASIC METABOLIC PANEL
Anion gap: 7 (ref 5–15)
BUN: 8 mg/dL (ref 6–20)
CALCIUM: 7.8 mg/dL — AB (ref 8.9–10.3)
CO2: 21 mmol/L — AB (ref 22–32)
CREATININE: 0.69 mg/dL (ref 0.61–1.24)
Chloride: 109 mmol/L (ref 101–111)
GFR calc Af Amer: >60 mL/min (ref >60)
GFR calc non Af Amer: >60 mL/min (ref >60)
GLUCOSE: 104 mg/dL — AB (ref 65–99)
Potassium: 3.1 mmol/L — ABNORMAL LOW (ref 3.5–5.1)
Sodium: 137 mmol/L (ref 135–145)

## 2015-06-10 LAB — ECHOCARDIOGRAM COMPLETE: WEIGHTICAEL: 2294.4 [oz_av]

## 2015-06-10 MED ORDER — PANTOPRAZOLE SODIUM 40 MG PO TBEC
40.0000 mg | DELAYED_RELEASE_TABLET | Freq: Every day | ORAL | Status: DC
  Administered 2015-06-11: 40 mg via ORAL
  Filled 2015-06-10: qty 1

## 2015-06-10 NOTE — Progress Notes (Signed)
  Echocardiogram 2D Echocardiogram has been performed.  Spencer [name] 06/10/2015, 4:10 PM

## 2015-06-10 NOTE — Clinical Social Work Note (Signed)
CSW spoke with [facility], patient is a long term care resident at facility.  [facility] can accept patient back once he is medically ready for discharge and orders have been received.  Jones Broom. [place], MSW, [place] 06/10/2015 5:34 PM

## 2015-06-10 NOTE — Progress Notes (Signed)
PROGRESS NOTE    Spencer [name]  [id number] DOB: 08-13-1917 DOA: 06/08/2015 PCP: [name], MD    Brief Narrative:  On 06/08/2015 by Ms. Tye Savoy, NP Spencer [name] is a 82 y.o. male with multiple medical problems not limited to diabetes, hypertension, aortic stenosis, peripheral last disease and atrial fibrillation. 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 Patient apparently slipped out of his wheelchair, fell and struck his head on Friday but was not brought to the ED. Patient HOH, daughter and son-in-law in room and helped provide history. Patient was not dizzy when he slipped out of his wheelchair Friday. He was leaning down to pick something up and fell out of the wheelchair. He has chronic SOB, no worse than usual. No chest pains or dizziness. Per daughter, no one at nursing home as reported black stools. Since January family has noticed intermittent confusion, intermittent slurred speech but right facial droop today is new.   Assessment & Plan   Right facial droop -Intermittent slurred speech and confusion on admission, appears to have resolved -MRI brain showed no acute infarct -Neurology consulted and appreciated -MRA Head was negative -Echocardiogram pending -Aspirin anticoagulation currently held due to GI bleed -LDL 33 -PT consulted recommended SNF  Anemia of blood loss/questionable GI bleed -Patient was on Eliquis as well as aspirin -Hemoglobin upon admission 5.7, FOBT positive -Patient transfused 2 units PRBC -Hemoglobin 8 -Continue to monitor CBC -Gastroenterology consulted and appreciated, felt patient was not a candidate for invasive workup -Continue PPI  Questionable atrial fibrillation -Was diagnosed with atrial fibrillation at [facility] in January 2017 -Cardiology consulted and appreciated, strips were reviewed, felt this to be wandering atrial pacemaker/Mobitz 1 heart block, no atrial fib seen on strips. Recommended discontinuing Eliquis  with no clearly documented aVF and also given GI bleed this admission  Diabetes mellitus, type II -Metformin held -Continue insulin sliding scale was CBG monitoring -Hemoglobin A1c 7.6  Aortic stenosis -No echocardiogram or records in Epic -Echocardiogram pending -Sees Dr Saralyn Pilar  Depression -Continue Cymbalta  Hyperlipidemia -Continue statin  PVD -Status post right BKA in March 2017. -Has left lower extremity ulcers on admission, currently an Unna boot  Left great toe onychomycosis/ subungual hematoma from trauma -Continue Lamisil daily for 12 weeks -Will need outpatient LFTs in one month  Right inner thigh chafing -Continue Silvadene cream  Protein calorie malnutrition -Nutrition consulted and appreciated -Poor oral intake  DVT Prophylaxis  SCDs  Code Status: DO NOT RESUSCITATE  Family Communication: None at bedside  Disposition Plan: Admitted, continue to monitor hemoglobin. Will need SNF.  Consultants Cardiology Gastroenterology Neurology  Procedures  None  Antibiotics   Anti-infectives    Start     Dose/Rate Route Frequency Ordered Stop   06/08/15 1830  terbinafine (LAMISIL) tablet 250 mg     250 mg Oral Daily 06/08/15 1736        Subjective:   Spencer [name] seen and examined today. Has no complaints this morning. Denies chest pain, shortness breath, abdominal pain, nausea or vomiting.  Objective:   Filed Vitals:   06/09/15 1152 06/09/15 1358 06/09/15 2109 06/10/15 0447  BP: 152/48 133/50 105/58 144/58  Pulse: 78 87 72 67  Temp: 98.6 F (37 C) 98 F (36.7 C) 98.8 F (37.1 C) 98.5 F (36.9 C)  TempSrc: Oral Oral Oral Oral  Resp: 17  19 18   Weight:    65.046 kg (143 lb 6.4 oz)  SpO2: 95% 99% 99% 99%    Intake/Output Summary (Last  24 hours) at 06/10/15 1210 Last data filed at 06/10/15 0858  Gross per 24 hour  Intake   1320 ml  Output      0 ml  Net   1320 ml   Filed Weights   06/09/15 0100 06/10/15 0447  Weight: 63.685 kg  (140 lb 6.4 oz) 65.046 kg (143 lb 6.4 oz)    Exam  General: Well developed, elderly, NAD, appears stated age  61: NCAT,  mucous membranes moist.   Cardiovascular: S1 S2 auscultated,3/6SEM, RRR  Respiratory: Clear to auscultation bilaterally   Abdomen: Soft, nontender, nondistended, + bowel sounds  Extremities: Right BKA, LLE in unna boot  Neuro: AAOx3, right facial droop, hard of hearing, otherwise nonfocal  Psych: Normal affect and demeanor   Data Reviewed: I have personally reviewed following labs and imaging studies  CBC:  Recent Labs Lab 06/08/15 1208 06/09/15 0457 06/09/15 1544 06/10/15 0517  WBC 13.5* 10.7*  --  10.1  NEUTROABS 10.9*  --   --   --   HGB 5.7* 9.1* 9.5* 8.0*  HCT 23.2* 31.9* 33.5* 28.0*  MCV 62.9* 68.3*  --  67.1*  PLT 382 288  --  [id number]   Basic Metabolic Panel:  Recent Labs Lab 06/08/15 1208 06/09/15 0457 06/10/15 0517  NA 138 139 137  K 3.6 3.4* 3.1*  CL 107 109 109  CO2 23 21* 21*  GLUCOSE 126* 90 104*  BUN 12 9 8   CREATININE 0.73 0.73 0.69  CALCIUM 8.5* 8.1* 7.8*   GFR: Estimated Creatinine Clearance: 47 mL/min (by C-G formula based on Cr of 0.69). Liver Function Tests:  Recent Labs Lab 06/08/15 1208  AST 24  ALT 16*  ALKPHOS 68  BILITOT 1.4*  PROT 6.9  ALBUMIN 3.0*   No results for input(s): LIPASE, AMYLASE in the last 168 hours. No results for input(s): AMMONIA in the last 168 hours. Coagulation Profile: No results for input(s): INR, PROTIME in the last 168 hours. Cardiac Enzymes: No results for input(s): CKTOTAL, CKMB, CKMBINDEX, TROPONINI in the last 168 hours. BNP (last 3 results) No results for input(s): PROBNP in the last 8760 hours. HbA1C:  Recent Labs  06/08/15 1208  HGBA1C 7.6*   CBG:  Recent Labs Lab 06/09/15 1154 06/09/15 1715 06/09/15 2203 06/10/15 0820 06/10/15 1135  GLUCAP 99 174* 121* 103* 154*   Lipid Profile:  Recent Labs  06/09/15 0457  CHOL 66  HDL 21*  LDLCALC 33  TRIG  62  CHOLHDL 3.1   Thyroid Function Tests: No results for input(s): TSH, T4TOTAL, FREET4, T3FREE, THYROIDAB in the last 72 hours. Anemia Panel: No results for input(s): VITAMINB12, FOLATE, FERRITIN, TIBC, IRON, RETICCTPCT in the last 72 hours. Urine analysis:    Component Value Date/Time   COLORURINE AMBER* 06/08/2015 [place] 06/08/2015 1204   LABSPEC 1.022 06/08/2015 1204   PHURINE 5.5 06/08/2015 [place] 06/08/2015 1204   HGBUR NEGATIVE 06/08/2015 1204   BILIRUBINUR SMALL* 06/08/2015 1204   KETONESUR 15* 06/08/2015 1204   PROTEINUR NEGATIVE 06/08/2015 1204   NITRITE NEGATIVE 06/08/2015 1204   LEUKOCYTESUR NEGATIVE 06/08/2015 1204   Sepsis Labs: @LABRCNTIP (procalcitonin:4,lacticidven:4)  ) Recent Results (from the past 240 hour(s))  Urine culture     Status: None   Collection Time: 06/08/15 12:04 PM  Result Value Ref Range Status   Specimen Description URINE, RANDOM  Final   Special Requests Normal  Final   Culture NO GROWTH  Final   Report Status 06/10/2015  FINAL  Final  MRSA PCR Screening     Status: Abnormal   Collection Time: 06/09/15  6:06 AM  Result Value Ref Range Status   MRSA by PCR POSITIVE (A) NEGATIVE Final    Comment:        The GeneXpert MRSA Assay (FDA approved for NASAL specimens only), is one component of a comprehensive MRSA colonization surveillance program. It is not intended to diagnose MRSA infection nor to guide or monitor treatment for MRSA infections. RESULT CALLED TO, READ BACK BY AND VERIFIED WITH: Regino Schultze RN 10:20 06/09/15 ([email or web address])       Radiology Studies: Mr Brain Wo Contrast  06/08/2015  CLINICAL DATA:  [place] and hit head 3 days ago.  Right facial droop EXAM: MRI HEAD WITHOUT CONTRAST MRA HEAD WITHOUT CONTRAST TECHNIQUE: Multiplanar, multiecho pulse sequences of the brain and surrounding structures were obtained without intravenous contrast. Angiographic images of the head were obtained using  MRA technique without contrast. COMPARISON:  CT head 06/08/2015 FINDINGS: MRI HEAD FINDINGS The patient was not able to complete the study. Diffusion-weighted imaging only was obtained followed by MRA. No other imaging of the brain. MRA neck not performed. Negative for acute infarct. There is generalized atrophy with ventricular enlargement. 12 mm area of diffusion hyperintensity to the left of the spinal cord at approximately the C1 level. This appears separate from the spinal cord. Review of the recent CT of the face does not show a definite mass lesion in this area. This would require further imaging to determine the etiology and location of this abnormality. MRA HEAD FINDINGS Both vertebral arteries patent to the basilar. AICA patent bilaterally. Superior cerebellar and posterior cerebral arteries patent bilaterally. Left posterior communicating artery patent. Cavernous carotid widely patent bilaterally. Anterior and middle cerebral arteries patent bilaterally without stenosis. Negative for cerebral aneurysm. IMPRESSION: Limited imaging of the brain.  No acute infarct. 12 mm area of diffusion hyperintensity within the spinal canal to the left of the cord at the C1 level uncertain etiology. When the patient is able to hold still, MRI of the cervical spine with contrast suggested for further elucidation Negative MRA of the head. Electronically Signed   By: Franchot Gallo M.D.   On: 06/08/2015 21:00   Mr Lovenia Kim  06/08/2015  CLINICAL DATA:  [place] and hit head 3 days ago.  Right facial droop EXAM: MRI HEAD WITHOUT CONTRAST MRA HEAD WITHOUT CONTRAST TECHNIQUE: Multiplanar, multiecho pulse sequences of the brain and surrounding structures were obtained without intravenous contrast. Angiographic images of the head were obtained using MRA technique without contrast. COMPARISON:  CT head 06/08/2015 FINDINGS: MRI HEAD FINDINGS The patient was not able to complete the study. Diffusion-weighted imaging only was obtained  followed by MRA. No other imaging of the brain. MRA neck not performed. Negative for acute infarct. There is generalized atrophy with ventricular enlargement. 12 mm area of diffusion hyperintensity to the left of the spinal cord at approximately the C1 level. This appears separate from the spinal cord. Review of the recent CT of the face does not show a definite mass lesion in this area. This would require further imaging to determine the etiology and location of this abnormality. MRA HEAD FINDINGS Both vertebral arteries patent to the basilar. AICA patent bilaterally. Superior cerebellar and posterior cerebral arteries patent bilaterally. Left posterior communicating artery patent. Cavernous carotid widely patent bilaterally. Anterior and middle cerebral arteries patent bilaterally without stenosis. Negative for cerebral aneurysm. IMPRESSION: Limited imaging of the brain.  No  acute infarct. 12 mm area of diffusion hyperintensity within the spinal canal to the left of the cord at the C1 level uncertain etiology. When the patient is able to hold still, MRI of the cervical spine with contrast suggested for further elucidation Negative MRA of the head. Electronically Signed   By: Franchot Gallo M.D.   On: 06/08/2015 21:00   Ct Maxillofacial Wo Cm  06/08/2015  CLINICAL DATA:  82 year old male with history of trauma from a fall 3 days ago with laceration in the left frontal region. EXAM: CT MAXILLOFACIAL WITHOUT CONTRAST TECHNIQUE: Multidetector CT imaging of the maxillofacial structures was performed. Multiplanar CT image reconstructions were also generated. A small metallic BB was placed on the right temple in order to reliably differentiate right from left. COMPARISON:  Head CT 06/08/2015. FINDINGS: No acute displaced facial bone fractures. Specifically, pterygoid plates are intact. Mandible is intact, and the mandibular condyles are located bilaterally. Bilateral globes and retro-orbital soft tissues are grossly  normal in appearance. Soft tissue swelling beneath the nose and anterior to the right side of the maxilla. Visualized intracranial contents demonstrate severe cerebral atrophy. Paranasal sinuses and mastoids are well pneumatized. IMPRESSION: 1. No evidence of significant acute traumatic injury to the facial bones. Electronically Signed   By: Vinnie Langton M.D.   On: 06/08/2015 13:14     Scheduled Meds: . Chlorhexidine Gluconate Cloth  6 each Topical Q0600  . DULoxetine  20 mg Oral Daily  . feeding supplement (ENSURE ENLIVE)  237 mL Oral BID BM  . insulin aspart  0-9 Units Subcutaneous TID WC  . mupirocin ointment  1 application Nasal BID  . [START ON 06/11/2015] pantoprazole  40 mg Oral Daily  . senna-docusate  2 tablet Oral Daily  . simvastatin  20 mg Oral QHS  . sodium chloride flush  3 mL Intravenous Q12H  . terbinafine  250 mg Oral Daily   Continuous Infusions:    LOS: 2 days   Time Spent in minutes   30 minutes  Jecenia Leamer D.O. on 06/10/2015 at 12:10 PM  Between 7am to 7pm - Pager - [phone]  After 7pm go to [email or web address] - password [email or web address]  And look for the night coverage person covering for me after hours  [facility] Office  [phone]

## 2015-06-11 LAB — BASIC METABOLIC PANEL
Anion gap: 8 (ref 5–15)
BUN: 10 mg/dL (ref 6–20)
CHLORIDE: 105 mmol/L (ref 101–111)
CO2: 22 mmol/L (ref 22–32)
Calcium: 7.9 mg/dL — ABNORMAL LOW (ref 8.9–10.3)
Creatinine, Ser: 0.65 mg/dL (ref 0.61–1.24)
GFR calc Af Amer: >60 mL/min (ref >60)
GFR calc non Af Amer: >60 mL/min (ref >60)
Glucose, Bld: 100 mg/dL — ABNORMAL HIGH (ref 65–99)
POTASSIUM: 3.7 mmol/L (ref 3.5–5.1)
SODIUM: 135 mmol/L (ref 135–145)

## 2015-06-11 LAB — CBC
HCT: 29.9 % — ABNORMAL LOW (ref 39.0–52.0)
HEMOGLOBIN: 8.4 g/dL — AB (ref 13.0–17.0)
MCH: 19.3 pg — AB (ref 26.0–34.0)
MCHC: 28.1 g/dL — ABNORMAL LOW (ref 30.0–36.0)
MCV: 68.7 fL — AB (ref 78.0–100.0)
Platelets: 299 10*3/uL (ref 150–400)
RBC: 4.35 MIL/uL (ref 4.22–5.81)
RDW: 23.8 % — ABNORMAL HIGH (ref 11.5–15.5)
WBC: 12.3 10*3/uL — ABNORMAL HIGH (ref 4.0–10.5)

## 2015-06-11 LAB — GLUCOSE, CAPILLARY
GLUCOSE-CAPILLARY: 153 mg/dL — AB (ref 65–99)
GLUCOSE-CAPILLARY: 120 mg/dL — AB (ref 65–99)

## 2015-06-11 MED ORDER — TERBINAFINE HCL 250 MG PO TABS: 250.0000 mg | ORAL_TABLET | Freq: Every day | ORAL | Status: DC

## 2015-06-11 MED ORDER — ENSURE ENLIVE PO LIQD: 237.0000 mL | Freq: Two times a day (BID) | ORAL | Status: DC

## 2015-06-11 MED ORDER — HYDROCODONE-ACETAMINOPHEN 5-325 MG PO TABS: 1.0000 | ORAL_TABLET | Freq: Four times a day (QID) | ORAL | Status: AC | PRN

## 2015-06-11 MED ORDER — PANTOPRAZOLE SODIUM 40 MG PO TBEC: 40.0000 mg | DELAYED_RELEASE_TABLET | Freq: Every day | ORAL | Status: DC

## 2015-06-11 NOTE — Discharge Instructions (Signed)
Anemia, Nonspecific Anemia is a condition in which the concentration of red blood cells or hemoglobin in the blood is below normal. Hemoglobin is a substance in red blood cells that carries oxygen to the tissues of the body. Anemia results in not enough oxygen reaching these tissues.  CAUSES  Common causes of anemia include:   Excessive bleeding. Bleeding may be internal or external. This includes excessive bleeding from periods (in women) or from the intestine.   Poor nutrition.   Chronic kidney, thyroid, and liver disease.  Bone marrow disorders that decrease red blood cell production.  Cancer and treatments for cancer.  HIV, AIDS, and their treatments.  Spleen problems that increase red blood cell destruction.  Blood disorders.  Excess destruction of red blood cells due to infection, medicines, and autoimmune disorders. SIGNS AND SYMPTOMS   Minor weakness.   Dizziness.   Headache.  Palpitations.   Shortness of breath, especially with exercise.   Paleness.  Cold sensitivity.  Indigestion.  Nausea.  Difficulty sleeping.  Difficulty concentrating. Symptoms may occur suddenly or they may develop slowly.  DIAGNOSIS  Additional blood tests are often needed. These help your health care provider determine the best treatment. Your health care provider will check your stool for blood and look for other causes of blood loss.  TREATMENT  Treatment varies depending on the cause of the anemia. Treatment can include:   Supplements of iron, vitamin B12, or folic acid.   Hormone medicines.   A blood transfusion. This may be needed if blood loss is severe.   Hospitalization. This may be needed if there is significant continual blood loss.   Dietary changes.  Spleen removal. HOME CARE INSTRUCTIONS Keep all follow-up appointments. It often takes many weeks to correct anemia, and having your health care provider check on your condition and your response to  treatment is very important. SEEK IMMEDIATE MEDICAL CARE IF:   You develop extreme weakness, shortness of breath, or chest pain.   You become dizzy or have trouble concentrating.  You develop heavy vaginal bleeding.   You develop a rash.   You have bloody or black, tarry stools.   You faint.   You vomit up blood.   You vomit repeatedly.   You have abdominal pain.  You have a fever or persistent symptoms for more than 2-3 days.   You have a fever and your symptoms suddenly get worse.   You are dehydrated.  MAKE SURE YOU:  Understand these instructions.  Will watch your condition.  Will get help right away if you are not doing well or get worse.   This information is not intended to replace advice given to you by your health care provider. Make sure you discuss any questions you have with your health care provider.   Document Released: 02/04/2004 Document Revised: 08/29/2012 Document Reviewed: 06/22/2012 Elsevier Interactive Patient Education 2016 [employer]  

## 2015-06-11 NOTE — NC FL2 (Signed)
[place] LEVEL OF CARE SCREENING TOOL     IDENTIFICATION  Patient Name: Spencer [name] Birthdate: [birth year] Sex: male Admission Date (Current Location): 06/08/2015  [place] and [place] Number:  Engineering geologist and Address:  [facility], [address] [place], [place] [address]      Provider Number: [id number]  Attending Physician Name and Address:  Cristal Ford, DO  Relative Name and Phone Number:       Current Level of Care: Hospital Recommended Level of Care: [place] Prior Approval Number:    Date Approved/Denied:   PASRR Number: [id number]  Discharge Plan: SNF    Current Diagnoses: Patient Active Problem List   Diagnosis Date Noted  . Anemia 06/08/2015  . Facial droop 06/08/2015  . Hypertension 06/08/2015  . Aortic stenosis 06/08/2015  . Peripheral vascular disease ([place]) 06/08/2015  . Onychomycosis of left great toe 06/08/2015  . Diabetes mellitus, type 2 ([place]) 06/08/2015  . Stroke-like symptoms 06/08/2015  . Diabetes mellitus with complication ([place])   . Fall   . Bleeding gastrointestinal   . Anemia due to GI blood loss   . 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 Cellulitis 01/21/2015    Orientation RESPIRATION BLADDER Height & Weight     Self, Place  Normal Incontinent Weight: 146 lb 3.2 oz (66.316 kg) Height:  5\' 4"  (162.6 cm)  BEHAVIORAL SYMPTOMS/MOOD NEUROLOGICAL BOWEL NUTRITION STATUS      Incontinent Diet  AMBULATORY STATUS COMMUNICATION OF NEEDS Skin   Extensive Assist Verbally Surgical wounds                       Personal Care Assistance Level of Assistance  Bathing, Dressing           Functional Limitations Info  Hearing   Hearing Info: Impaired      SPECIAL CARE FACTORS FREQUENCY  PT (By licensed PT)  5x a day                  Contractures Contractures Info: Not present    Additional Factors Info  Insulin Sliding  Scale Code Status Info: DNR Allergies Info: NKA   Insulin Sliding Scale Info: 3x a day       Current Medications (06/11/2015):  This is the current hospital active medication list Current Facility-Administered Medications  Medication Dose Route Frequency Provider Last Rate Last Dose  . acetaminophen (TYLENOL) tablet 650 mg  650 mg Oral Q6H PRN Willia Craze, NP       Or  . acetaminophen (TYLENOL) suppository 650 mg  650 mg Rectal Q6H PRN Willia Craze, NP      . Chlorhexidine Gluconate Cloth 2 % PADS 6 each  6 each Topical Q0600 Albertine Patricia, MD   6 each at 06/10/15 0550  . DULoxetine (CYMBALTA) DR capsule 20 mg  20 mg Oral Daily Willia Craze, NP   20 mg at 06/11/15 1030  . feeding supplement (ENSURE ENLIVE) (ENSURE ENLIVE) liquid 237 mL  237 mL Oral BID BM Silver Huguenin Elgergawy, MD   237 mL at 06/11/15 1031  . HYDROcodone-acetaminophen (NORCO/VICODIN) 5-325 MG per tablet 1 tablet  1 tablet Oral Q6H PRN Willia Craze, NP   1 tablet at 06/10/15 1121  . insulin aspart (novoLOG) injection 0-9 Units  0-9 Units Subcutaneous TID WC Willia Craze, NP   2 Units at 06/11/15 [phone]  . LORazepam (ATIVAN) injection 1  mg  1 mg Intravenous Once PRN Albertine Patricia, MD      . mupirocin ointment (BACTROBAN) 2 % 1 application  1 application Nasal BID Albertine Patricia, MD   1 application at [phone]  . ondansetron (ZOFRAN) tablet 4 mg  4 mg Oral Q6H PRN Willia Craze, NP       Or  . ondansetron [facility]) injection 4 mg  4 mg Intravenous Q6H PRN Willia Craze, NP      . pantoprazole (PROTONIX) EC tablet 40 mg  40 mg Oral Daily Albertine Patricia, MD   40 mg at 06/11/15 1031  . polyvinyl alcohol (LIQUIFILM TEARS) 1.4 % ophthalmic solution 1 drop  1 drop Both Eyes QID PRN Willia Craze, NP      . senna-docusate (Senokot-S) tablet 2 tablet  2 tablet Oral Daily Waldemar Dickens, MD   2 tablet at 06/11/15 1030  . simvastatin (ZOCOR) tablet 20 mg  20 mg Oral QHS Willia Craze, NP    20 mg at 06/10/15 2234  . sodium chloride flush (NS) 0.9 % injection 3 mL  3 mL Intravenous Q12H Willia Craze, NP   3 mL at 06/11/15 1031  . terbinafine (LAMISIL) tablet 250 mg  250 mg Oral Daily Willia Craze, NP   250 mg at 06/11/15 1030  . traZODone (DESYREL) tablet 25 mg  25 mg Oral QHS PRN Willia Craze, NP   25 mg at 06/09/15 2203     Discharge Medications: Please see discharge summary for a list of discharge medications.  Relevant Imaging Results:  Relevant Lab Results:   Additional Information SS: [id number]  Ross Ludwig, LCSWA

## 2015-06-11 NOTE — Discharge Summary (Addendum)
Physician Discharge Summary  Spencer [name] [id number] DOB: [birth year] DOA: 06/08/2015  PCP: [name], MD  Admit date: 06/08/2015 Discharge date: 06/11/2015  Time spent: 45 minutes  Recommendations for Outpatient Follow-up:  Patient will be discharged to [facility] skilled nursing facility.  Continue physical and occupational therapy.  Patient will need to follow up with primary care provider within one week of discharge, repeat CBC.  Patient will also need liver function tests in 4 weeks as he was started on lamisil.  Patient should continue medications as prescribed.  Patient should follow a heart healthy/carb modified diet.   Discharge Diagnoses:  Right facial droop Anemia of blood loss/questionable GI bleed Questionable atrial fibrillation Diabetes mellitus, type II Aortic stenosis Depression Hyperlipidemia PVD Left great toe onychomycoses/subungual hematoma from trauma Right inner thigh chafing Protein calorie malnutrition  Discharge Condition: Stable  Diet recommendation: heart healthy/carb modified diet  Filed Weights   06/09/15 0100 06/10/15 0447 06/11/15 0139  Weight: 63.685 kg (140 lb 6.4 oz) 65.046 kg (143 lb 6.4 oz) 66.316 kg (146 lb 3.2 oz)    History of present illness:  On 06/08/2015 by Ms. Tye Savoy, NP Spencer [name] is a 82 y.o. male with multiple medical problems not limited to diabetes, hypertension, aortic stenosis, peripheral last disease and atrial fibrillation. 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 Patient apparently slipped out of his wheelchair, fell and struck his head on Friday but was not brought to the ED. Patient HOH, daughter and son-in-law in room and helped provide history. Patient was not dizzy when he slipped out of his wheelchair Friday. He was leaning down to pick something up and fell out of the wheelchair. He has chronic SOB, no worse than usual. No chest pains or dizziness. Per daughter, no one at nursing home as reported black  stools. Since January family has noticed intermittent confusion, intermittent slurred speech but right facial droop today is new.   Hospital Course:  Right facial droop -Intermittent slurred speech and confusion on admission, appears to have resolved -MRI brain showed no acute infarct -Neurology consulted and appreciated -MRA Head was negative -MRA  Neck was ordered, however patient refused. Spoke with patient's daughter at bedside, she also felt it was needed at time given patient's condition, age -Echocardiogram EF60-65%, Right ventricular systolic pressure was increased consistent with moderate pulmonary hypertension -Aspirin anticoagulation currently held due to GI bleed- restart 81mg  at discharge- monitor CBC in one week. -LDL 33 -PT consulted recommended SNF  Anemia of blood loss/questionable GI bleed -Patient was on Eliquis as well as aspirin -Hemoglobin upon admission 5.7, FOBT positive -Patient transfused 2 units PRBC -Hemoglobin 8.4 -Continue to monitor CBC -Gastroenterology consulted and appreciated, felt patient was not a candidate for invasive workup -Continue PPI  Questionable atrial fibrillation -Was diagnosed with atrial fibrillation at [facility] in January 2017 -Cardiology consulted and appreciated, strips were reviewed, felt this to be wandering atrial pacemaker/Mobitz 1 heart block, no atrial fib seen on strips. Recommended discontinuing Eliquis with no clearly documented Afib and also given GI bleed this admission  Diabetes mellitus, type II -Metformin held-resume upon discharge -Was placed on insulin sliding scale was CBG monitoring -Hemoglobin A1c 7.6  Aortic stenosis -No echocardiogram or records in Epic -Echocardiogram bicuspid morphology cannot be excluded. Severe diffuse thickening calcification. Moderate to severe stenosis, mild regurgitation. -Sees Dr Saralyn Pilar  Depression -Continue Cymbalta  Hyperlipidemia -Continue statin  PVD -Status  post right BKA in March 2017. -Has left lower extremity ulcers on admission, currently  an Haematologist  Left great toe onychomycosis/ subungual hematoma from trauma -Continue Lamisil daily for 12 weeks -Will need outpatient LFTs in one month  Right inner thigh chafing -Continue Silvadene cream  Protein calorie malnutrition -Nutrition consulted and appreciated -Poor oral intake  Code status: DNR  Consultants Cardiology Gastroenterology Neurology  Procedures  Echocardiogram  Discharge Exam: Filed Vitals:   06/10/15 1439 06/10/15 2148  BP: 123/51 141/60  Pulse: 80 76  Temp: 98 F (36.7 C) 97.5 F (36.4 C)  Resp:  17    Exam  General: Well developed, elderly, NAD, appears stated age  HEENT: NCAT, mucous membranes moist.   Cardiovascular: S1 S2 auscultated,3/6SEM, RRR  Respiratory: Clear to auscultation bilaterally  Abdomen: Soft, nontender, nondistended, + bowel sounds  Extremities: Right BKA, LLE in unna boot  Neuro: AAOx3, right facial droop, hard of hearing, otherwise nonfocal  Psych: Normal affect and demeanor  Discharge Instructions      Discharge Instructions    Discharge instructions    Complete by:  As directed   Patient will be discharged to [facility] skilled nursing facility.  Continue physical and occupational therapy.  Patient will need to follow up with primary care provider within one week of discharge, repeat CBC.  Patient will also need liver function tests in 4 weeks as he was started on lamisil.  Patient should continue medications as prescribed.  Patient should follow a heart healthy/carb modified diet.            Medication List    STOP taking these medications        ELIQUIS 5 MG Tabs tablet  Generic drug:  apixaban      TAKE these medications        acetaminophen 325 MG tablet  Commonly known as:  TYLENOL  Take 650 mg by mouth every 4 (four) hours as needed for mild pain or fever.     aspirin EC 81 MG tablet  Take 81  mg by mouth every other day.     DULoxetine 20 MG capsule  Commonly known as:  CYMBALTA  Take 20 mg by mouth daily.     feeding supplement (ENSURE ENLIVE) Liqd  Take 237 mLs by mouth 2 (two) times daily between meals.     furosemide 40 MG tablet  Commonly known as:  LASIX  Take 40 mg by mouth daily as needed for fluid. Reported on 01/23/2015     HYDROcodone-acetaminophen 5-325 MG tablet  Commonly known as:  NORCO/VICODIN  Take 1 tablet by mouth every 6 (six) hours as needed for moderate pain.     metFORMIN 500 MG (MOD) 24 hr tablet  Commonly known as:  GLUMETZA  Take 500 mg by mouth daily with breakfast.     pantoprazole 40 MG tablet  Commonly known as:  PROTONIX  Take 1 tablet (40 mg total) by mouth daily.     polyvinyl alcohol 1.4 % ophthalmic solution  Commonly known as:  LIQUIFILM TEARS  Place 1 drop into both eyes 4 (four) times daily as needed for dry eyes (and irritation).     PROBIOTIC DAILY PO  Take 1 capsule by mouth at bedtime.     sennosides-docusate sodium 8.6-50 MG tablet  Commonly known as:  SENOKOT-S  Take 2 tablets by mouth daily.     simvastatin 20 MG tablet  Commonly known as:  ZOCOR  Take 20 mg by mouth at bedtime.     terbinafine 250 MG tablet  Commonly known as:  LAMISIL  Take 1 tablet (250 mg total) by mouth daily.       No Known Allergies Follow-up Information    Follow up with [name], MD. Schedule an appointment as soon as possible for a visit in 1 week.   Specialties:  Internal Medicine, Pediatrics   Why:  Hospital follow up   Contact information:   [place] [phone]        The results of significant diagnostics from this hospitalization (including imaging, microbiology, ancillary and laboratory) are listed below for reference.    Significant Diagnostic Studies: Ct Head Wo Contrast  06/08/2015  CLINICAL DATA:  Right-sided facial droop. EXAM: CT HEAD WITHOUT CONTRAST TECHNIQUE:  Contiguous axial images were obtained from the base of the skull through the vertex without intravenous contrast. COMPARISON:  01/23/2015 FINDINGS: There is a hyperdense, partially calcified anterior parafalcine mass likely representing a meningioma unchanged compared with the prior exam. There is no evidence of mass effect, midline shift, or extra-axial fluid collections. There is no evidence of a space-occupying lesion or intracranial hemorrhage. There is no evidence of a cortical-based area of acute infarction. There is generalized cerebral atrophy. There is periventricular white matter low attenuation likely secondary to microangiopathy. The ventricles and sulci are appropriate for the patient's age. The basal cisterns are patent. Visualized portions of the orbits are unremarkable. The visualized portions of the paranasal sinuses and mastoid air cells are unremarkable. Cerebrovascular atherosclerotic calcifications are noted. The osseous structures are unremarkable. IMPRESSION: 1. No acute intracranial pathology. 2. Chronic microvascular disease and cerebral atrophy. Electronically Signed   By: Kathreen Devoid   On: 06/08/2015 12:23   Mr Brain Wo Contrast  06/08/2015  CLINICAL DATA:  [place] and hit head 3 days ago.  Right facial droop EXAM: MRI HEAD WITHOUT CONTRAST MRA HEAD WITHOUT CONTRAST TECHNIQUE: Multiplanar, multiecho pulse sequences of the brain and surrounding structures were obtained without intravenous contrast. Angiographic images of the head were obtained using MRA technique without contrast. COMPARISON:  CT head 06/08/2015 FINDINGS: MRI HEAD FINDINGS The patient was not able to complete the study. Diffusion-weighted imaging only was obtained followed by MRA. No other imaging of the brain. MRA neck not performed. Negative for acute infarct. There is generalized atrophy with ventricular enlargement. 12 mm area of diffusion hyperintensity to the left of the spinal cord at approximately the C1 level.  This appears separate from the spinal cord. Review of the recent CT of the face does not show a definite mass lesion in this area. This would require further imaging to determine the etiology and location of this abnormality. MRA HEAD FINDINGS Both vertebral arteries patent to the basilar. AICA patent bilaterally. Superior cerebellar and posterior cerebral arteries patent bilaterally. Left posterior communicating artery patent. Cavernous carotid widely patent bilaterally. Anterior and middle cerebral arteries patent bilaterally without stenosis. Negative for cerebral aneurysm. IMPRESSION: Limited imaging of the brain.  No acute infarct. 12 mm area of diffusion hyperintensity within the spinal canal to the left of the cord at the C1 level uncertain etiology. When the patient is able to hold still, MRI of the cervical spine with contrast suggested for further elucidation Negative MRA of the head. Electronically Signed   By: Franchot Gallo M.D.   On: 06/08/2015 21:00   Mr Lovenia Kim  06/08/2015  CLINICAL DATA:  [place] and hit head 3 days ago.  Right facial droop EXAM: MRI HEAD WITHOUT CONTRAST MRA HEAD WITHOUT CONTRAST TECHNIQUE: Multiplanar, multiecho  pulse sequences of the brain and surrounding structures were obtained without intravenous contrast. Angiographic images of the head were obtained using MRA technique without contrast. COMPARISON:  CT head 06/08/2015 FINDINGS: MRI HEAD FINDINGS The patient was not able to complete the study. Diffusion-weighted imaging only was obtained followed by MRA. No other imaging of the brain. MRA neck not performed. Negative for acute infarct. There is generalized atrophy with ventricular enlargement. 12 mm area of diffusion hyperintensity to the left of the spinal cord at approximately the C1 level. This appears separate from the spinal cord. Review of the recent CT of the face does not show a definite mass lesion in this area. This would require further imaging to determine the  etiology and location of this abnormality. MRA HEAD FINDINGS Both vertebral arteries patent to the basilar. AICA patent bilaterally. Superior cerebellar and posterior cerebral arteries patent bilaterally. Left posterior communicating artery patent. Cavernous carotid widely patent bilaterally. Anterior and middle cerebral arteries patent bilaterally without stenosis. Negative for cerebral aneurysm. IMPRESSION: Limited imaging of the brain.  No acute infarct. 12 mm area of diffusion hyperintensity within the spinal canal to the left of the cord at the C1 level uncertain etiology. When the patient is able to hold still, MRI of the cervical spine with contrast suggested for further elucidation Negative MRA of the head. Electronically Signed   By: Franchot Gallo M.D.   On: 06/08/2015 21:00   Ct Maxillofacial Wo Cm  06/08/2015  CLINICAL DATA:  82 year old male with history of trauma from a fall 3 days ago with laceration in the left frontal region. EXAM: CT MAXILLOFACIAL WITHOUT CONTRAST TECHNIQUE: Multidetector CT imaging of the maxillofacial structures was performed. Multiplanar CT image reconstructions were also generated. A small metallic BB was placed on the right temple in order to reliably differentiate right from left. COMPARISON:  Head CT 06/08/2015. FINDINGS: No acute displaced facial bone fractures. Specifically, pterygoid plates are intact. Mandible is intact, and the mandibular condyles are located bilaterally. Bilateral globes and retro-orbital soft tissues are grossly normal in appearance. Soft tissue swelling beneath the nose and anterior to the right side of the maxilla. Visualized intracranial contents demonstrate severe cerebral atrophy. Paranasal sinuses and mastoids are well pneumatized. IMPRESSION: 1. No evidence of significant acute traumatic injury to the facial bones. Electronically Signed   By: Vinnie Langton M.D.   On: 06/08/2015 13:14    Microbiology: Recent Results (from the past 240  hour(s))  Urine culture     Status: None   Collection Time: 06/08/15 12:04 PM  Result Value Ref Range Status   Specimen Description URINE, RANDOM  Final   Special Requests Normal  Final   Culture NO GROWTH  Final   Report Status 06/10/2015 FINAL  Final  MRSA PCR Screening     Status: Abnormal   Collection Time: 06/09/15  6:06 AM  Result Value Ref Range Status   MRSA by PCR POSITIVE (A) NEGATIVE Final    Comment:        The GeneXpert MRSA Assay (FDA approved for NASAL specimens only), is one component of a comprehensive MRSA colonization surveillance program. It is not intended to diagnose MRSA infection nor to guide or monitor treatment for MRSA infections. RESULT CALLED TO, READ BACK BY AND VERIFIED WITH: Regino Schultze RN 10:20 06/09/15 ([email or web address])      Labs: Basic Metabolic Panel:  Recent Labs Lab 06/08/15 1208 06/09/15 0457 06/10/15 0517 06/11/15 0320  NA 138 139 137 135  K 3.6 3.4*  3.1* 3.7  CL 107 109 109 105  CO2 23 21* 21* 22  GLUCOSE 126* 90 104* 100*  BUN 12 9 8 10   CREATININE 0.73 0.73 0.69 0.65  CALCIUM 8.5* 8.1* 7.8* 7.9*   Liver Function Tests:  Recent Labs Lab 06/08/15 1208  AST 24  ALT 16*  ALKPHOS 68  BILITOT 1.4*  PROT 6.9  ALBUMIN 3.0*   No results for input(s): LIPASE, AMYLASE in the last 168 hours. No results for input(s): AMMONIA in the last 168 hours. CBC:  Recent Labs Lab 06/08/15 1208 06/09/15 0457 06/09/15 1544 06/10/15 0517 06/11/15 0320  WBC 13.5* 10.7*  --  10.1 12.3*  NEUTROABS 10.9*  --   --   --   --   HGB 5.7* 9.1* 9.5* 8.0* 8.4*  HCT 23.2* 31.9* 33.5* 28.0* 29.9*  MCV 62.9* 68.3*  --  67.1* 68.7*  PLT 382 288  --  300 299   Cardiac Enzymes: No results for input(s): CKTOTAL, CKMB, CKMBINDEX, TROPONINI in the last 168 hours. BNP: BNP (last 3 results) No results for input(s): BNP in the last 8760 hours.  ProBNP (last 3 results) No results for input(s): PROBNP in the last 8760 hours.  CBG:  Recent Labs Lab  06/10/15 0820 06/10/15 1135 06/10/15 1659 06/10/15 2149 06/11/15 0802  GLUCAP 103* 154* 145* 138* 153*       Signed:  Kaylynne Andres  [facility] 06/11/2015, 10:06 AM

## 2015-06-11 NOTE — Progress Notes (Signed)
Physical Therapy Treatment Patient Details Name: Spencer [name] MRN: [id number] DOB: 20-Aug-1917 Today's Date: 06/11/2015    History of Present Illness Spencer [name] is a 82 y.o. male with a Past Medical History of diabetes, HTN, HLD, PVD, A. fib who presents with symptomatic anemia, FOBT positive and here to r/o stroke with right facial droop after falling forward out of wheelchair and striking his head.     PT Comments    Pt performed +2 stand pivot transfer from recliner to stretcher with more erect stance but remains to require +2 mod.    Follow Up Recommendations        Equipment Recommendations       Recommendations for Other Services       Precautions / Restrictions Precautions Precautions: Fall Precaution Comments: history of falling out of w/c prior to admission.  Restrictions Weight Bearing Restrictions: No Other Position/Activity Restrictions: Rt BKA    Mobility  Bed Mobility               General bed mobility comments: Pt received in recliner chair.    Transfers Overall transfer level: Needs assistance (Performed to assist transport to stretcher and maintain pt's safety during mobility.  ) Equipment used: None;2 person hand held assist Transfers: Stand Pivot Transfers Sit to Stand: Mod assist;+2 physical assistance Stand pivot transfers: Mod assist;+2 physical assistance       General transfer comment: Pt able to follow commands for B HHA to pivot from recliner to stretcher.  Pt required cues for hand placement and pushing with LLE.    Ambulation/Gait                 Stairs            Wheelchair Mobility    Modified Rankin (Stroke Patients Only)       Balance                                    Cognition Arousal/Alertness: Awake/alert Behavior During Therapy: WFL for tasks assessed/performed Overall Cognitive Status: History of cognitive impairments - at baseline (family reports.  )                       Exercises      General Comments        Pertinent Vitals/Pain Pain Assessment: No/denies pain    Home Living                      Prior Function            PT Goals (current goals can now be found in the care plan section) Acute Rehab PT Goals Patient Stated Goal: get back to where he was before coming to the hospital Potential to Achieve Goals: Good Progress towards PT goals: Progressing toward goals    Frequency       PT Plan      Co-evaluation             End of Session Equipment Utilized During Treatment: Gait belt Activity Tolerance: Patient tolerated treatment well Patient left:  (on stretcher with EMS.  )     [id number] PT Time Calculation (min) (ACUTE ONLY): 8 min  Charges:  $Therapeutic Activity: 8-22 mins                    G Codes:  Spencer [name] Spencer [name] 06/11/2015, 3:14 PM  Spencer [name], PTA pager [phone]

## 2015-06-11 NOTE — Progress Notes (Signed)
Patient discharged to [facility] via [facility] transporter, reported to nurse [employer]

## 2015-06-12 DIAGNOSIS — K922 Gastrointestinal hemorrhage, unspecified: Secondary | ICD-10-CM | POA: Diagnosis not present

## 2015-06-12 DIAGNOSIS — E1142 Type 2 diabetes mellitus with diabetic polyneuropathy: Secondary | ICD-10-CM | POA: Diagnosis not present

## 2015-06-12 DIAGNOSIS — I480 Paroxysmal atrial fibrillation: Secondary | ICD-10-CM | POA: Diagnosis not present

## 2015-06-12 DIAGNOSIS — F0150 Vascular dementia without behavioral disturbance: Secondary | ICD-10-CM

## 2015-06-12 DIAGNOSIS — I350 Nonrheumatic aortic (valve) stenosis: Secondary | ICD-10-CM

## 2015-06-12 DIAGNOSIS — I739 Peripheral vascular disease, unspecified: Secondary | ICD-10-CM | POA: Diagnosis not present

## 2015-07-04 DIAGNOSIS — R2990 Unspecified symptoms and signs involving the nervous system: Secondary | ICD-10-CM | POA: Insufficient documentation

## 2015-07-04 DIAGNOSIS — S0990XA Unspecified injury of head, initial encounter: Secondary | ICD-10-CM | POA: Insufficient documentation

## 2015-08-07 DIAGNOSIS — R600 Localized edema: Secondary | ICD-10-CM | POA: Diagnosis not present

## 2015-08-13 ENCOUNTER — Emergency Department: Payer: Medicare Other

## 2015-08-13 ENCOUNTER — Telehealth: Payer: Self-pay

## 2015-08-13 ENCOUNTER — Emergency Department
Admission: EM | Admit: 2015-08-13 | Discharge: 2015-08-13 | Disposition: A | Discharge status: Home / Self Care | Payer: Medicare Other | Attending: Student | Admitting: Student

## 2015-08-13 DIAGNOSIS — Y939 Activity, unspecified: Secondary | ICD-10-CM | POA: Diagnosis not present

## 2015-08-13 DIAGNOSIS — Y9289 Other specified places as the place of occurrence of the external cause: Secondary | ICD-10-CM | POA: Insufficient documentation

## 2015-08-13 DIAGNOSIS — I10 Essential (primary) hypertension: Secondary | ICD-10-CM | POA: Diagnosis not present

## 2015-08-13 DIAGNOSIS — Z79899 Other long term (current) drug therapy: Secondary | ICD-10-CM | POA: Diagnosis not present

## 2015-08-13 DIAGNOSIS — Y999 Unspecified external cause status: Secondary | ICD-10-CM | POA: Diagnosis not present

## 2015-08-13 DIAGNOSIS — Z87891 Personal history of nicotine dependence: Secondary | ICD-10-CM | POA: Insufficient documentation

## 2015-08-13 DIAGNOSIS — W19XXXA Unspecified fall, initial encounter: Secondary | ICD-10-CM

## 2015-08-13 DIAGNOSIS — M25551 Pain in right hip: Secondary | ICD-10-CM | POA: Diagnosis present

## 2015-08-13 DIAGNOSIS — W06XXXA Fall from bed, initial encounter: Secondary | ICD-10-CM | POA: Insufficient documentation

## 2015-08-13 DIAGNOSIS — Z7982 Long term (current) use of aspirin: Secondary | ICD-10-CM | POA: Insufficient documentation

## 2015-08-13 DIAGNOSIS — Z7984 Long term (current) use of oral hypoglycemic drugs: Secondary | ICD-10-CM | POA: Diagnosis not present

## 2015-08-13 DIAGNOSIS — E119 Type 2 diabetes mellitus without complications: Secondary | ICD-10-CM | POA: Insufficient documentation

## 2015-08-13 MED ORDER — HYDROCODONE-ACETAMINOPHEN 5-325 MG PO TABS
1.0000 | ORAL_TABLET | Freq: Once | ORAL | Status: AC
  Administered 2015-08-13: 1 via ORAL
  Filled 2015-08-13: qty 1

## 2015-08-13 NOTE — Telephone Encounter (Signed)
PLEASE NOTE: All timestamps contained within this report are represented as [place] Standard Time. CONFIDENTIALTY NOTICE: This fax transmission is intended only for the addressee. It contains information that is legally privileged, confidential or otherwise protected from use or disclosure. If you are not the intended recipient, you are strictly prohibited from reviewing, disclosing, copying using or disseminating any of this information or taking any action in reliance on or regarding this information. If you have received this fax in error, please notify us immediately by telephone so that we can arrange for its return to [place]. Phone: [phone], Toll-Free: [phone], Fax: [phone] Page: 1 of 1 Call Id: [id number] [place] Night - Client Nonclinical Telephone Record [place] Night - Client Client Site [place] Physician [name] - MD Contact Type Call Who Is Calling Physician / Provider / Hospital Call Type Provider Call [facility] Page Now Reason for Call Request to speak to Physician Initial Comment Pt is complaining of right hip pain and need to send him to the ER Additional Comment Patient Name Spencer [name] Patient DOB June 21, 1917 Requesting Provider [facility] Physician Number [place] [facility] Paging DoctorName Phone DateTime Result/Outcome Message Type Notes Ronette Deter - MD [phone] 08/13/2015 4:37:57 AM Called On Call Provider - Left Message Doctor Paged This is [facility] with the answering service. Please call back at [phone]. Ronette Deter - MD 08/13/2015 4:40:00 AM Spoke with On Call - General Message Result Call Closed By: Vicente Males Transaction Date/Time: 08/13/2015 4:34:02 AM (ET)

## 2015-08-13 NOTE — ED Notes (Signed)
Patient transported to X-ray 

## 2015-08-13 NOTE — Telephone Encounter (Signed)
Seen in ED and returned to [facility]

## 2015-08-13 NOTE — Telephone Encounter (Signed)
Per chart review tab appears pt seen [facility] ED.

## 2015-08-13 NOTE — ED Provider Notes (Addendum)
[facility] Emergency Department Provider Note   ____________________________________________   First MD Initiated Contact with Patient 08/13/15 [phone]     (approximate)  I have reviewed the triage vital signs and the nursing notes.   HISTORY  Chief Complaint Fall and Hip Pain    HPI Spencer [name] is a 82 y.o. 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 The patient has frequent falls and as a result, his bed is only 6 inches above the floor (it is essentially a mattress on the floor).  He was found on the floor having fallen out of this bed when staff checked on him. He denies hitting his head. He reports that he just "gradually slipped out of the bed". He is complaining of pain in the right stump which is been ongoing for some time as well as right hip pain tonight. No recent illness including no vomiting, diarrhea, fevers or chills. No chest pain or difficulty breathing.   Past Medical History:  Diagnosis Date  . Anemia   . Aortic stenosis   . Arthritis   . Atrial fibrillation ([place])   . Diabetes mellitus without complication ([place])   . High cholesterol   . Hyperlipidemia   . Hypertension   . Peripheral vascular disease ([place])   . Shortness of breath dyspnea     Patient Active Problem List   Diagnosis Date Noted  . Head injury   . Stroke-like symptom   . Anemia 06/08/2015  . Facial droop 06/08/2015  . Hypertension 06/08/2015  . Aortic stenosis 06/08/2015  . Peripheral vascular disease ([place]) 06/08/2015  . Onychomycosis of left great toe 06/08/2015  . Diabetes mellitus, type 2 ([place]) 06/08/2015  . Stroke-like symptoms 06/08/2015  .  Diabetes mellitus with complication ([place])   . Fall   . Bleeding gastrointestinal   . Anemia due to GI blood loss   . Atherosclerosis of lower extremity with gangrene ([place]) 04/10/2015  . Cellulitis 01/21/2015    Past Surgical History:  Procedure Laterality Date  . AMPUTATION Right 01/30/2015   Procedure: AMPUTATION FOOT/ TRANSMETATARSAL;  Surgeon: Samara Deist, DPM;  Location: [facility] ORS;  Service: Podiatry;  Laterality: Right;  . AMPUTATION Right 04/10/2015   Procedure: AMPUTATION BELOW KNEE;  Surgeon: Katha Cabal, MD;  Location: [facility] ORS;  Service: Vascular;  Laterality: Right;  . APPENDECTOMY    . IRRIGATION AND DEBRIDEMENT FOOT Right 01/23/2015   Procedure: IRRIGATION AND DEBRIDEMENT FOOT;  Surgeon: Samara Deist, DPM;  Location: [facility] ORS;  Service: Podiatry;  Laterality: Right;  . PERIPHERAL VASCULAR CATHETERIZATION N/A 01/27/2015   Procedure: Abdominal Aortogram w/Lower Extremity;  Surgeon: Katha Cabal, MD;  Location: [place] [facility];  Service: Cardiovascular;  Laterality: N/A;  . PERIPHERAL VASCULAR CATHETERIZATION  01/27/2015   Procedure: Lower Extremity Intervention;  Surgeon: Katha Cabal, MD;  Location: [place] [facility];  Service: Cardiovascular;;    Prior to Admission medications   Medication Sig Start Date End Date Taking? Authorizing Provider  acetaminophen (TYLENOL) 325 MG tablet Take 650 mg by mouth every 4 (four) hours as needed for mild pain or fever.   Yes Historical Provider, MD  aspirin EC 81 MG tablet Take 81 mg  by mouth every other day.    Yes Historical Provider, MD  DULoxetine (CYMBALTA) 20 MG capsule Take 20 mg by mouth daily.   Yes Historical Provider, MD  ferrous sulfate 325 (65 FE) MG tablet Take 325 mg by mouth daily with breakfast.   Yes Historical Provider, MD  furosemide (LASIX) 40 MG tablet Take 40 mg by mouth daily as needed for fluid. Reported on 01/23/2015   Yes Historical Provider, MD  HYDROcodone-acetaminophen (NORCO/VICODIN)  5-325 MG tablet Take 1 tablet by mouth every 6 (six) hours as needed for moderate pain. 06/11/15  Yes Maryann Mikhail, DO  metFORMIN (GLUMETZA) 500 MG (MOD) 24 hr tablet Take 500 mg by mouth daily with breakfast.   Yes Historical Provider, MD  omeprazole (PRILOSEC) 20 MG capsule Take 20 mg by mouth daily.   Yes Historical Provider, MD  polyvinyl alcohol (LIQUIFILM TEARS) 1.4 % ophthalmic solution Place 1 drop into both eyes 4 (four) times daily as needed for dry eyes (and irritation).    Yes Historical Provider, MD  Probiotic Product (PROBIOTIC DAILY PO) Take 1 capsule by mouth at bedtime.   Yes Historical Provider, MD  sennosides-docusate sodium (SENOKOT-S) 8.6-50 MG tablet Take 2 tablets by mouth daily.   Yes Historical Provider, MD  silver sulfADIAZINE (SILVADENE) 1 % cream Apply 1 application topically once a week. Apply to left leg on Friday   Yes Historical Provider, MD  simvastatin (ZOCOR) 20 MG tablet Take 20 mg by mouth at bedtime.    Yes Historical Provider, MD    Allergies Review of patient's allergies indicates no known allergies.  Family History  Problem Relation Age of Onset  . Prostate cancer Father   . Heart attack Father   . Diabetes Mother     Social History Social History  Substance Use Topics  . Smoking status: Former Research scientist (life sciences)  . Smokeless tobacco: Never Used  . Alcohol use No    Review of Systems Constitutional: No fever/chills Eyes: No visual changes. ENT: No sore throat. Cardiovascular: Denies chest pain. Respiratory: Denies shortness of breath. Gastrointestinal: No abdominal pain.  No nausea, no vomiting.  No diarrhea.  No constipation. Genitourinary: Negative for dysuria. Musculoskeletal: Negative for back pain. Skin: Negative for rash. Neurological: Negative for headaches, focal weakness or numbness.  10-point ROS otherwise negative.  ____________________________________________   PHYSICAL EXAM:  0635 Vital Signs Vital Signs -  Pulse Rate: 79   Pulse Rate Source: Monitor  Bedside Cardiac Monitor On: No  Resp: 18  BP: 132/64  MAP (mmHg): 84  BP Location: Right Arm  BP Method: Automatic  Patient Position (if appropriate): Lying  Oxygen Therapy -  SpO2: 94 %  O2 Device: Room Air  Pulse Oximetry Type: Continuous      VITAL SIGNS: ED Triage Vitals  Enc Vitals Group     BP 08/13/15 0536 (!) 152/68     Pulse Rate 08/13/15 0536 75     Resp 08/13/15 0536 16     Temp 08/13/15 0536 98.5 F (36.9 C)     Temp Source 08/13/15 0536 Oral     SpO2 08/13/15 0536 95 %     Weight 08/13/15 0536 153 lb 12.8 oz (69.8 kg)     Height 08/13/15 0536 5\' 8"  (1.727 m)     Head Circumference --      Peak Flow --      Pain Score 08/13/15 0538 9     Pain Loc --      Pain Edu? --  Excl. in [place]? --     Constitutional: Alert and oriented. Severely hard of hearing. Well appearing and in no acute distress. Eyes: Conjunctivae are normal. PERRL. EOMI. Head: Atraumatic. Nose: No congestion/rhinnorhea. Mouth/Throat: Mucous membranes are moist.  Oropharynx non-erythematous. Neck: No stridor.  No cervical spine tenderness to palpation. Cardiovascular: Normal rate, regular rhythm. Grossly normal heart sounds.  Good peripheral circulation. Respiratory: Normal respiratory effort.  No retractions. Lungs CTAB. Gastrointestinal: Soft and nontender. No distention. No CVA tenderness. Genitourinary: deferred Musculoskeletal: Status post BKA in the right leg, mild tenderness to palpation in the right hip/groin but full passive range of motion at the right hip. The right stump is well healed, there is some mild erythema and tenderness in the posterior aspect of the stump distal and proximal to the right knee. Left leg with chronic erythema. Pulses stable to rock and compression. No midline T or L-spine tenderness to palpation. Neurologic:  Normal speech and language. No gross focal neurologic deficits are appreciated.  Skin:  Skin is warm, dry and intact. No rash  noted. Psychiatric: Mood and affect are normal. Speech and behavior are normal.  ____________________________________________   LABS (all labs ordered are listed, but only abnormal results are displayed)  Labs Reviewed - No data to display ____________________________________________  EKG  none ____________________________________________  RADIOLOGY  Xray right hip  FINDINGS: There is no evidence of hip fracture or dislocation. There is no evidence of arthropathy or other focal bone abnormality. Osteopenia. Phleboliths in the pelvis. Moderate vascular calcifications.  IMPRESSION: Negative.  Venous doppler ultrasound of right leg  IMPRESSION: No evidence of right lower extremity deep venous thrombosis. ____________________________________________   PROCEDURES  Procedure(s) performed: None  Procedures  Critical Care performed: No  ____________________________________________   INITIAL IMPRESSION / ASSESSMENT AND PLAN / ED COURSE  Pertinent labs & imaging results that were available during my care of the patient were reviewed by me and considered in my medical decision making (see chart for details).  Spencer [name] is a 82 y.o. 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 Fortunately, it was only a 6 inch fall from bed. On exam he is well-appearing and in no acute distress. Vital signs stable, he is afebrile. Head and neck atraumatic. He has mild tenderness in the right hip but good range of motion. He does have some mild erythema and tenderness in the posterior aspect of the right leg/stump so will obtain a venous Doppler ultrasound to rule out DVT. On chart review, there is a note of "5 chaffing" in his dc summary from June so  this may be a chronic finding. We'll obtain plain films of the right thigh to rule out acute fracture or dislocation though I doubt either of these, We'll treat him with his home pain medicines, reassess for disposition.  ----------------------------------------- 6:59 AM on 08/13/2015 ----------------------------------------- Plain films negative for fracture dislocation. Ultrasound negative for DVT. Pain well controlled. I suspect the color change findings in his thigh are chronic. DC with return precautions and close PCP for follow-up. He is comfortable with the discharge plan.   Clinical Course     ____________________________________________   FINAL CLINICAL IMPRESSION(S) / ED DIAGNOSES  Final diagnoses:  Fall, initial encounter  Right hip pain      NEW  MEDICATIONS STARTED DURING THIS VISIT:  New Prescriptions   No medications on file     Note:  This document was prepared using Dragon voice recognition software and may include unintentional dictation errors.    Joanne Gavel, MD 08/13/15 0700    Joanne Gavel, MD 08/13/15 [place], MD 08/13/15 [phone]

## 2015-08-13 NOTE — ED Triage Notes (Addendum)
Pt arrives to ED via [facility] from [facility] d/t a 4-6" fall from a mattress to the floor. Pt denies any head injury, denies LOC. Pt's only c/o is RIGHT hip pain and pain in the RIGHT leg r/t a BKA performed (according to the pt) approximately 3 weeks ago. Pt is A&Ox4, in NAD, with respirations even, regular and unlabored. EMS reports CBG of 131 mg/dL.

## 2015-08-26 DIAGNOSIS — F333 Major depressive disorder, recurrent, severe with psychotic symptoms: Secondary | ICD-10-CM

## 2015-08-26 DIAGNOSIS — I4891 Unspecified atrial fibrillation: Secondary | ICD-10-CM

## 2015-08-26 DIAGNOSIS — E1142 Type 2 diabetes mellitus with diabetic polyneuropathy: Secondary | ICD-10-CM | POA: Diagnosis not present

## 2015-08-26 DIAGNOSIS — Z89511 Acquired absence of right leg below knee: Secondary | ICD-10-CM

## 2015-08-26 DIAGNOSIS — I350 Nonrheumatic aortic (valve) stenosis: Secondary | ICD-10-CM

## 2015-08-26 DIAGNOSIS — I739 Peripheral vascular disease, unspecified: Secondary | ICD-10-CM | POA: Diagnosis not present

## 2015-08-26 DIAGNOSIS — E785 Hyperlipidemia, unspecified: Secondary | ICD-10-CM | POA: Diagnosis not present

## 2015-09-04 ENCOUNTER — Telehealth: Payer: Self-pay

## 2015-09-04 NOTE — Telephone Encounter (Signed)
Spencer [name] pts son (do not see DPR) left v/m; pt lives at [facility]; pt had doctors appt outside of clinic on 09/03/15 and black spot on lt foot was found. Spencer [name] wants to verify Dr Silvio Pate is aware of this since pt has already had rt foot amputated. Michael request cb with next course of action.

## 2015-09-04 NOTE — Telephone Encounter (Signed)
Spoke to son, Legrand [place]. He will call [facility] and ask for [facility].

## 2015-09-04 NOTE — Telephone Encounter (Signed)
Please let him know that we have wound care specialists caring for him at [facility] and we are aware of the black spots (and concerned about them). The PA who comes is named [employer]. He can ask the nurses to have [place] call him for updates about his plans. We did check the circulation to the leg and it is pretty good.

## 2015-09-23 DIAGNOSIS — L02811 Cutaneous abscess of head [any part, except face]: Secondary | ICD-10-CM | POA: Diagnosis not present

## 2015-10-12 DIAGNOSIS — M86072 Acute hematogenous osteomyelitis, left ankle and foot: Secondary | ICD-10-CM | POA: Diagnosis not present

## 2016-01-11 DEATH — deceased

## 2016-06-18 IMAGING — CR DG CHEST 2V
1 series · 2 of 2 positions shown · non-contrast
Comparison: January 25, 2015.

CLINICAL DATA: Preop for left below-knee amputation.

EXAM:
CHEST  2 VIEW

[Series 1: dg chest 2 view · 0.14mm/px · 2 of 2 slices shown]
[im 1/2]
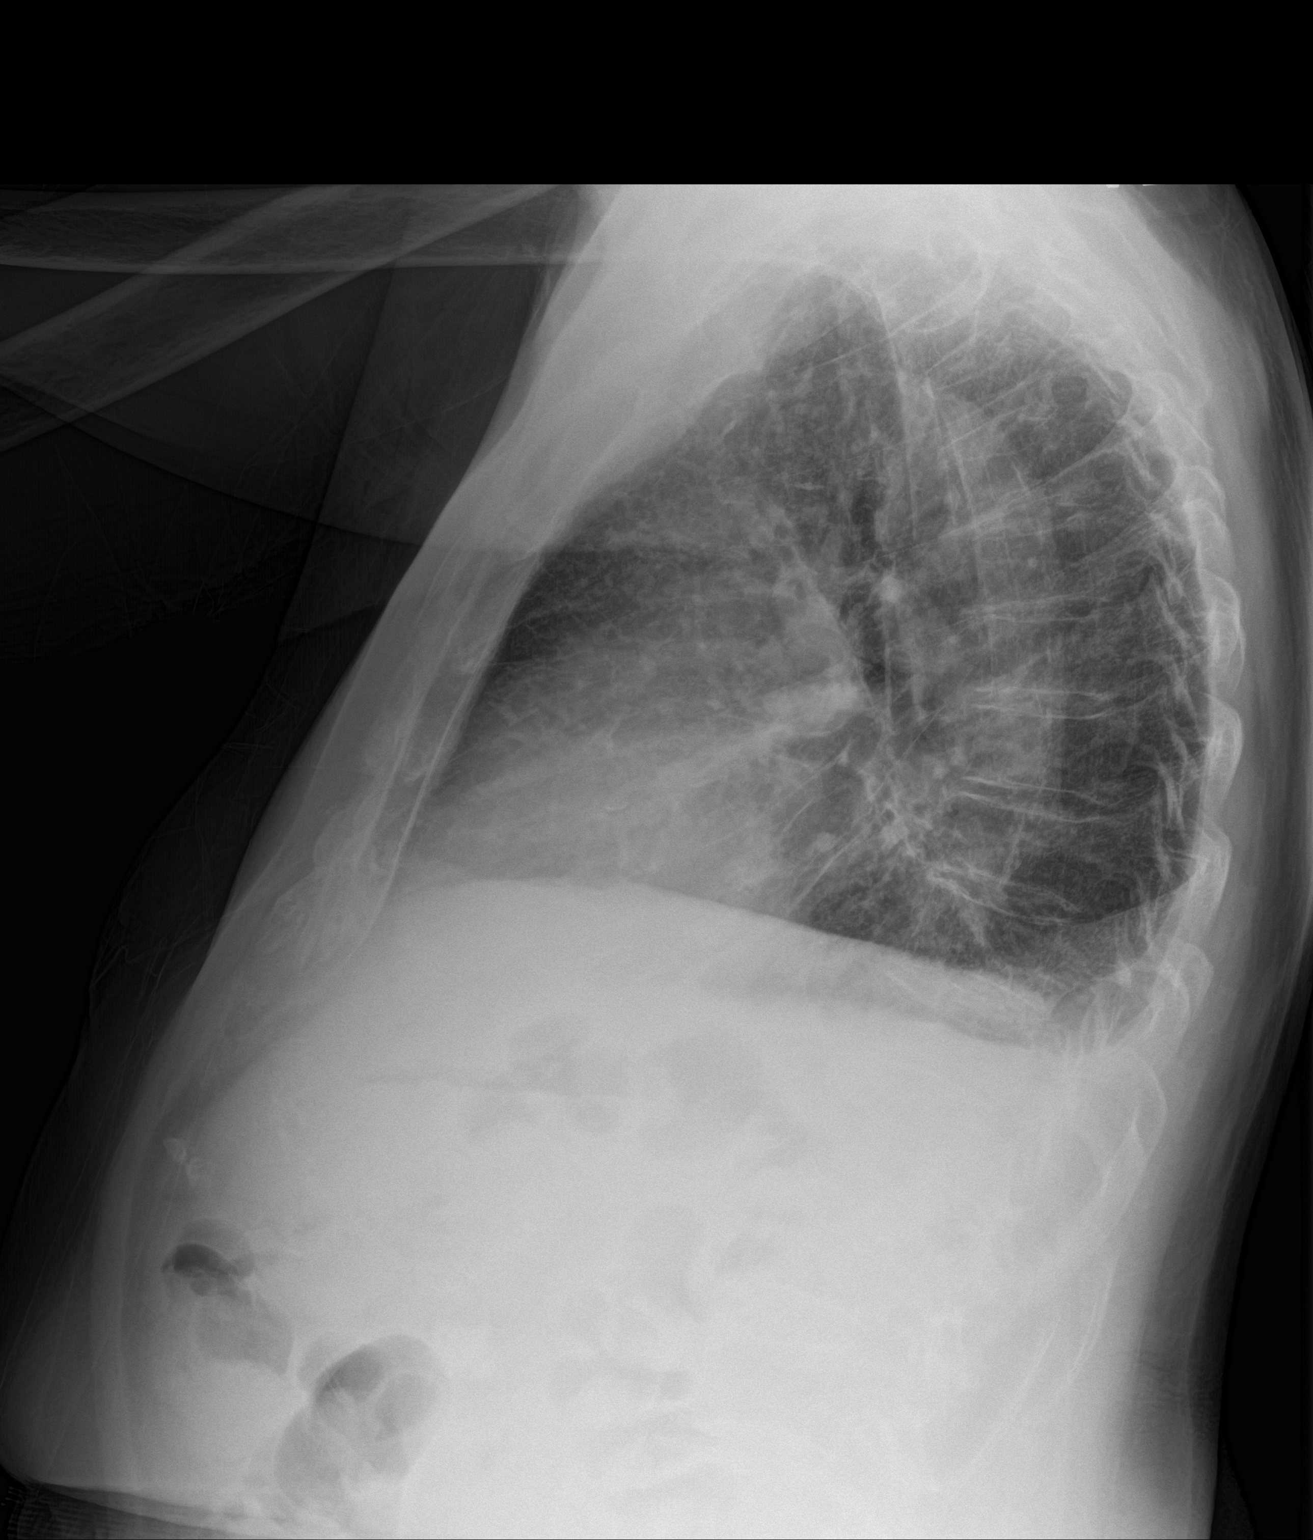
[im 2/2]
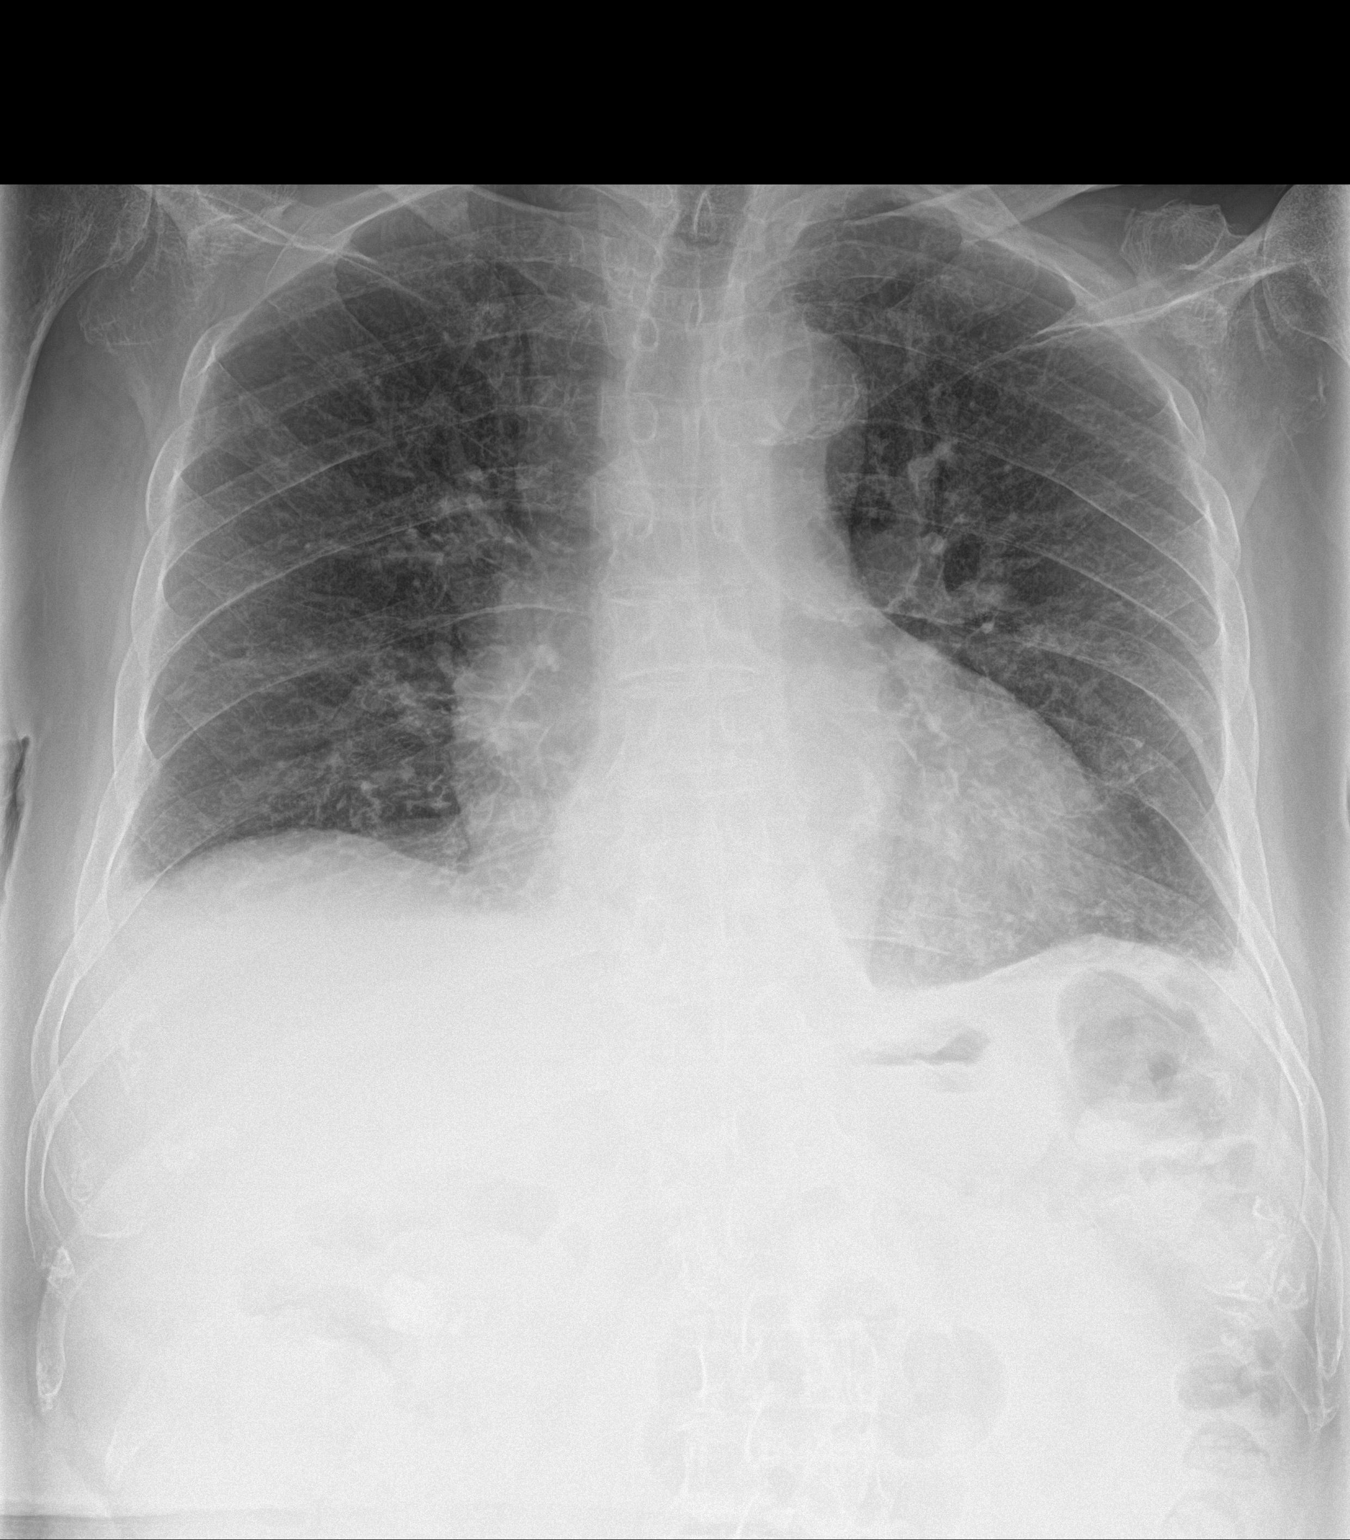

[2 of 2 positions shown; findings below may reference images not displayed]

FINDINGS: Stable cardiomediastinal silhouette. No pneumothorax is noted. No
acute pulmonary disease is noted. Minimal bilateral pleural
effusions are noted. Narrowing of the subacromial space is seen
bilaterally suggesting rotator cuff injury.
IMPRESSION: Minimal bilateral pleural effusions.

## 2016-08-20 IMAGING — CT CT HEAD W/O CM
3 series · 15 of 47 positions shown, 18 images · non-contrast
Comparison: 01/23/2015

CLINICAL DATA: Right-sided facial droop.

EXAM:
CT HEAD WITHOUT CONTRAST
TECHNIQUE: Contiguous axial images were obtained from the base of the skull
through the vertex without intravenous contrast.

[Series 2: head 5.0 st · axial · 0.46mm/px · z∈[+1244,+1394]mm · 9 of 36 slices shown, 12 images]
[im 3/36  brain]
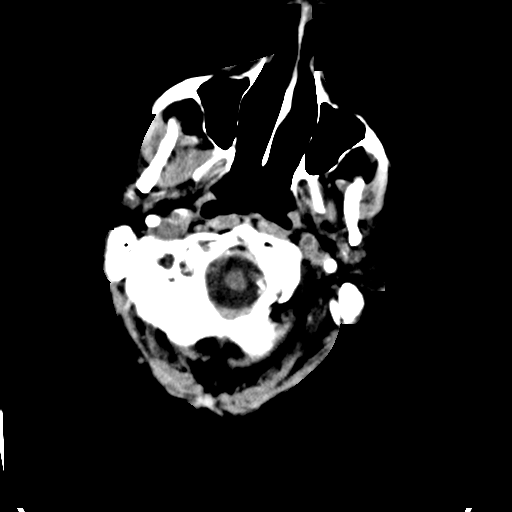
[im 3/36  bone]
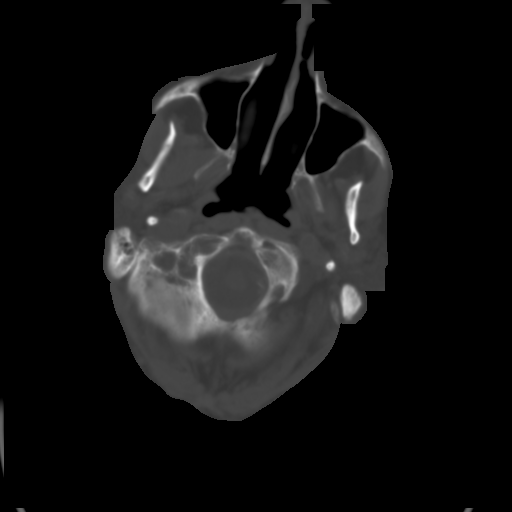
[im 7/36  brain]
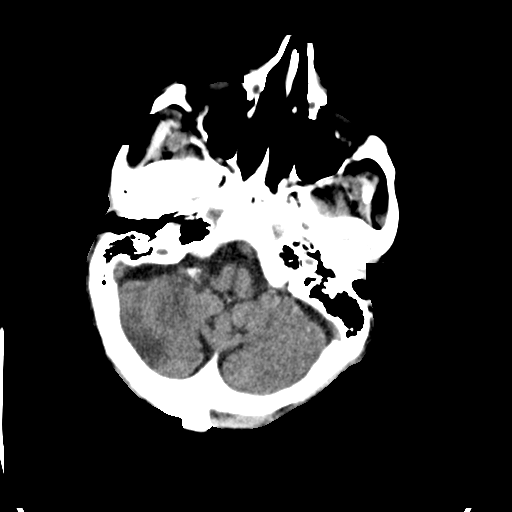
[im 10/36  brain]
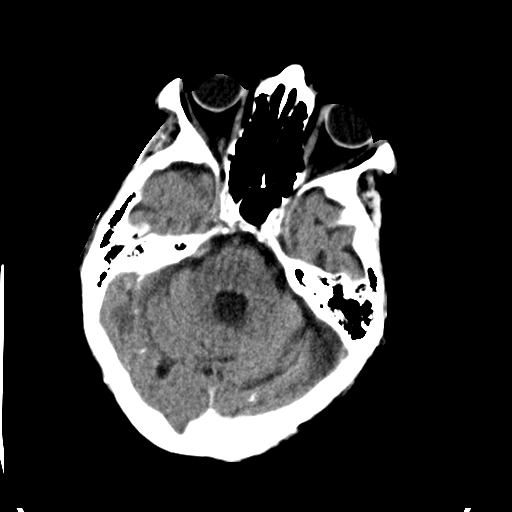
[im 14/36  brain]
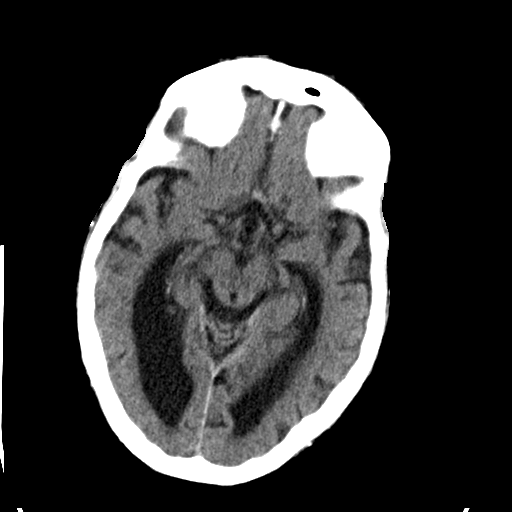
[im 19/36  brain]
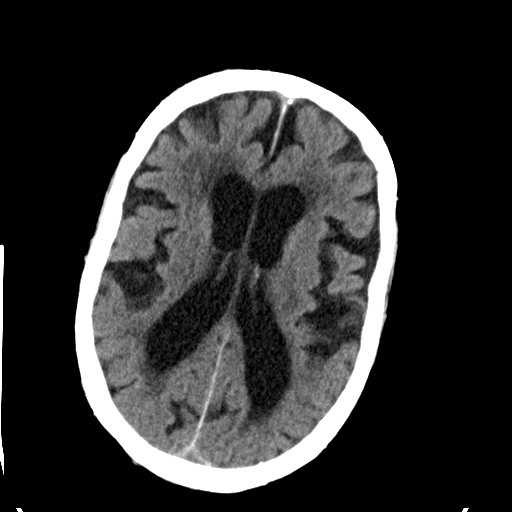
[im 19/36  bone]
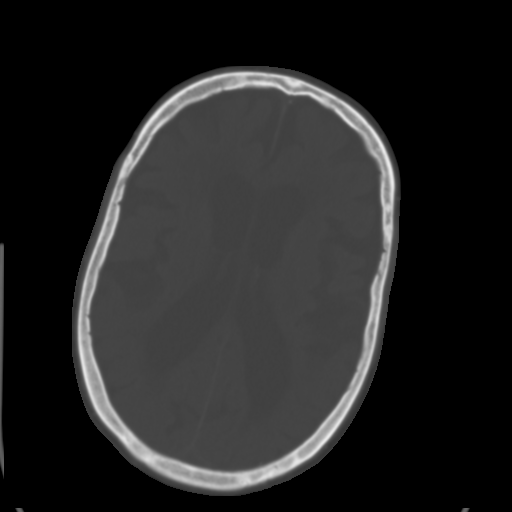
[im 22/36  brain]
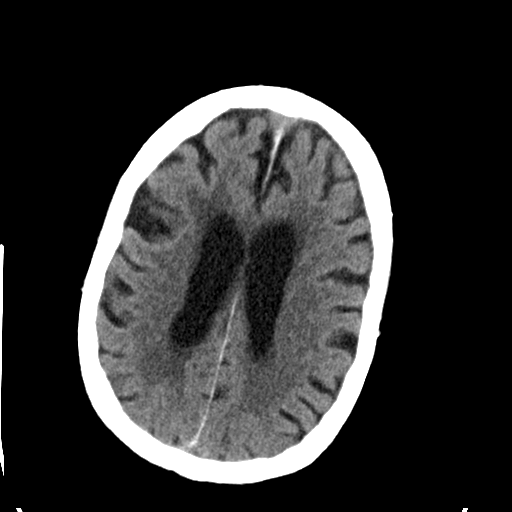
[im 26/36  brain]
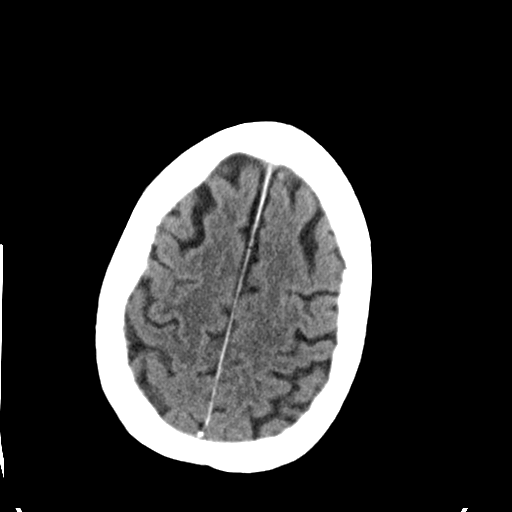
[im 29/36  brain]
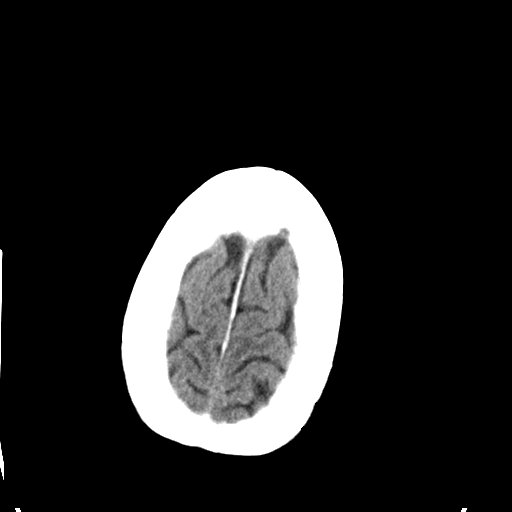
[im 33/36  brain]
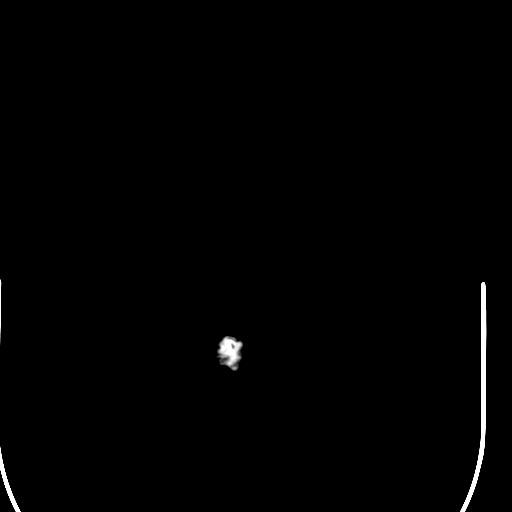
[im 33/36  bone]
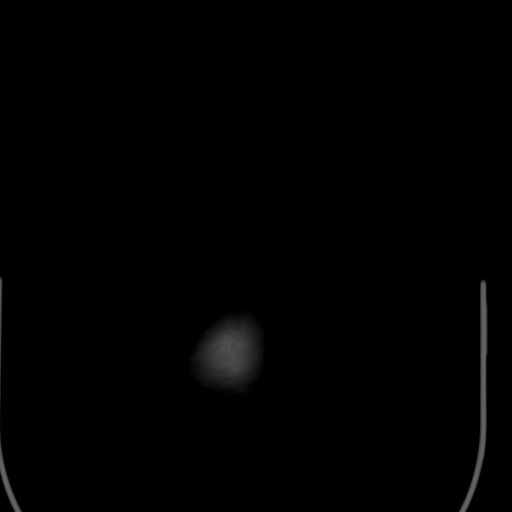

[Series 4: head 3.0 cor st · coronal · 0.35mm/px · 3 of 75 slices shown]
[im 25/75  brain]
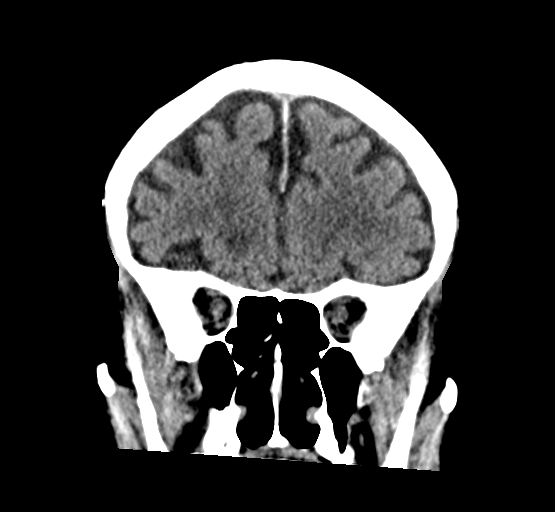
[im 33/75  brain]
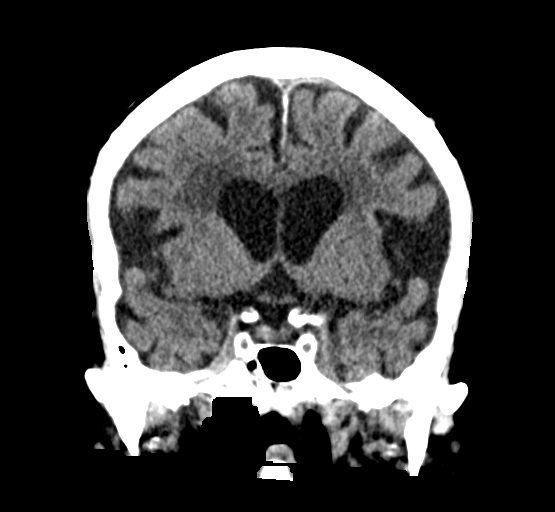
[im 42/75  brain]
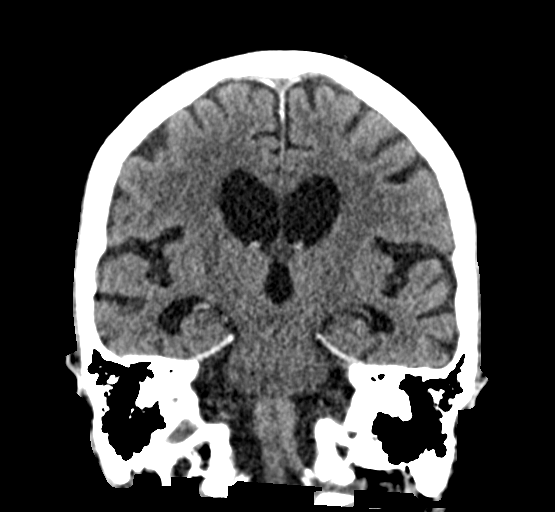

[Series 5: head 3.0 sag st · sagittal · 0.35mm/px · 3 of 66 slices shown]
[im 22/66  brain]
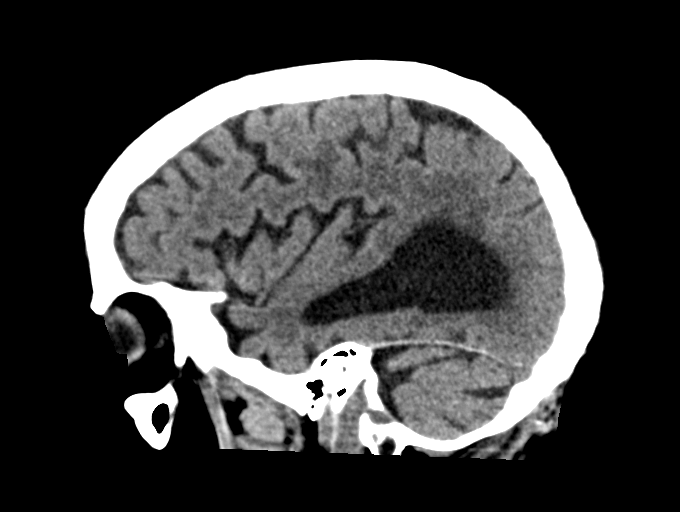
[im 33/66  brain]
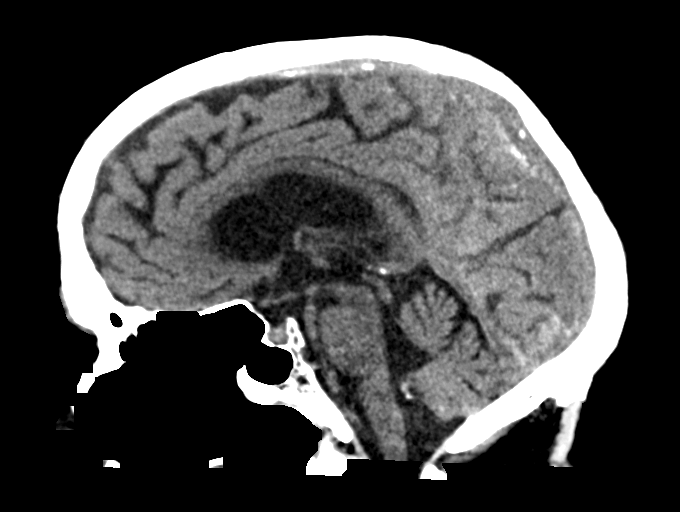
[im 44/66  brain]
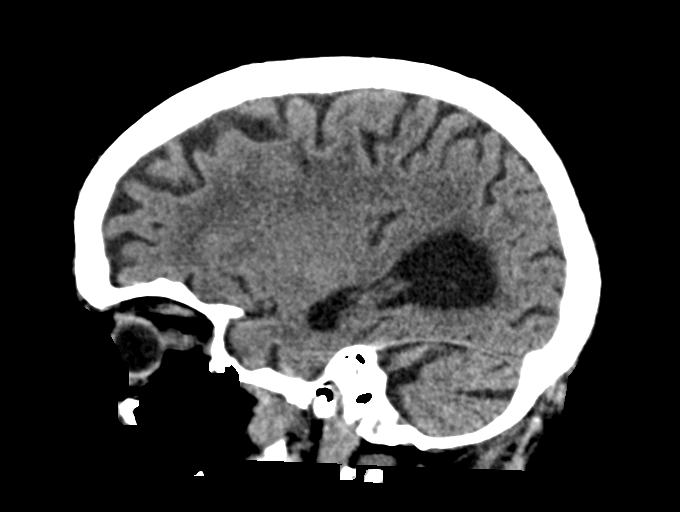

[15 of 47 positions shown; findings below may reference images not displayed]

FINDINGS: There is a hyperdense, partially calcified anterior parafalcine mass
likely representing a meningioma unchanged compared with the prior
exam.

There is no evidence of mass effect, midline shift, or extra-axial
fluid collections. There is no evidence of a space-occupying lesion
or intracranial hemorrhage. There is no evidence of a cortical-based
area of acute infarction. There is generalized cerebral atrophy.
There is periventricular white matter low attenuation likely
secondary to microangiopathy.

The ventricles and sulci are appropriate for the patient's age. The
basal cisterns are patent.

Visualized portions of the orbits are unremarkable. The visualized
portions of the paranasal sinuses and mastoid air cells are
unremarkable. Cerebrovascular atherosclerotic calcifications are
noted.

The osseous structures are unremarkable.
IMPRESSION: 1. No acute intracranial pathology.
2. Chronic microvascular disease and cerebral atrophy.

## 2018-05-18 IMAGING — US US EXTREM LOW VENOUS*R*
1 series · 13 of 24 positions shown · non-contrast
Comparison: None.

CLINICAL DATA: Right-sided leg pain.



[Series 1: us extrem low venous*right* · 0.08mm/px · 13 of 29 slices shown]
[im 1/29]
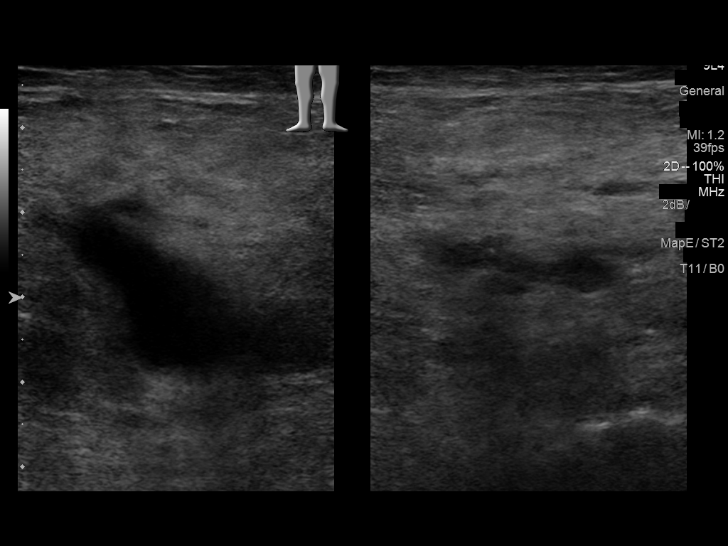
[im 3/29]
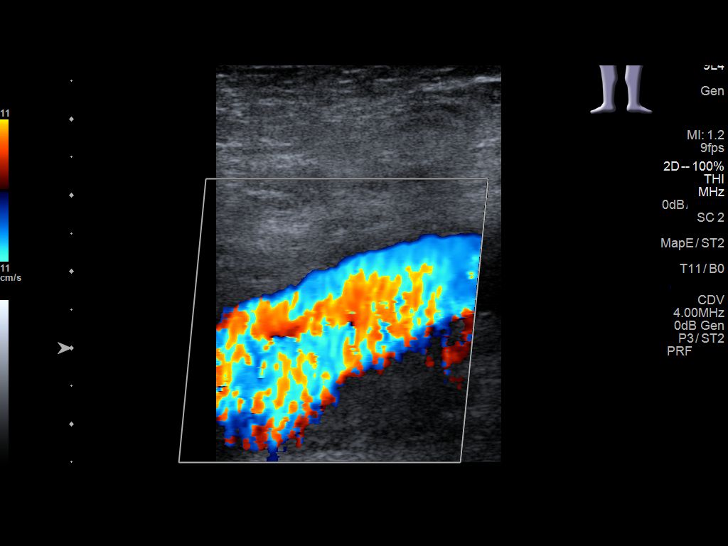
[im 5/29]
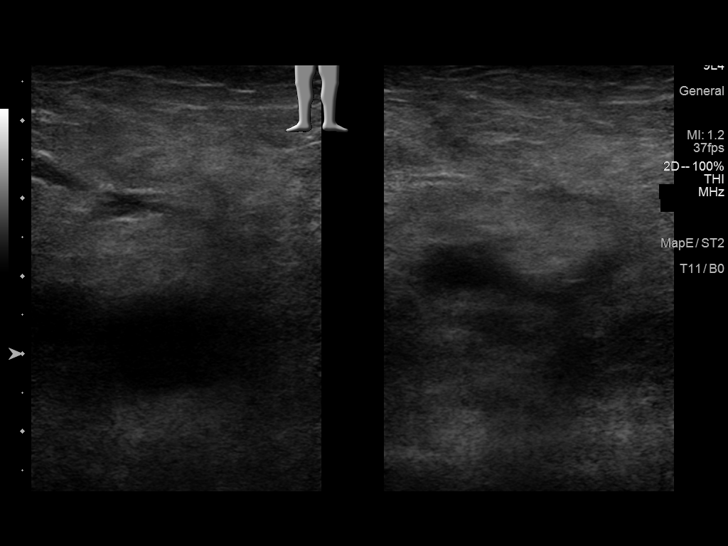
[im 8/29]
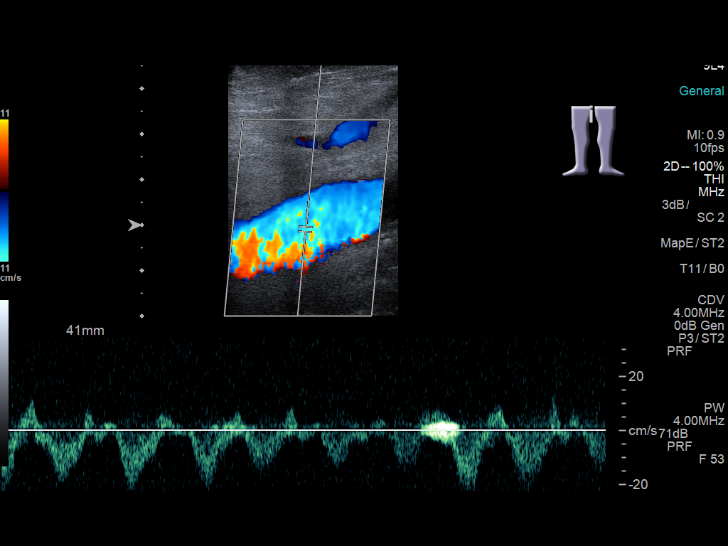
[im 10/29]
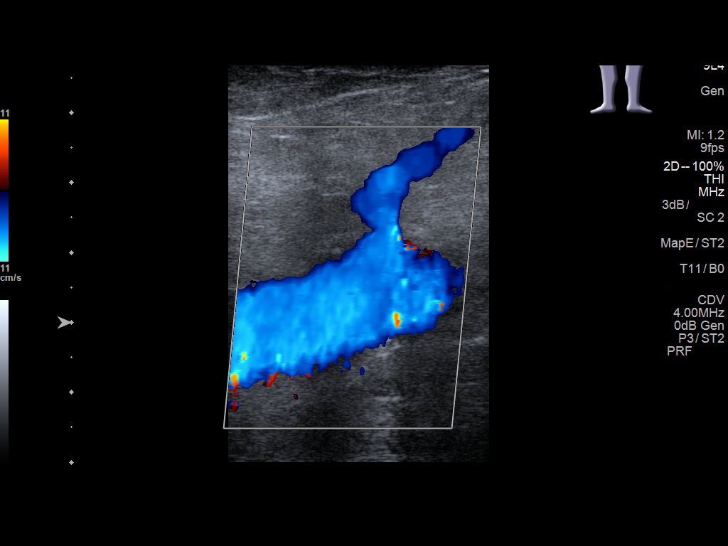
[im 13/29]
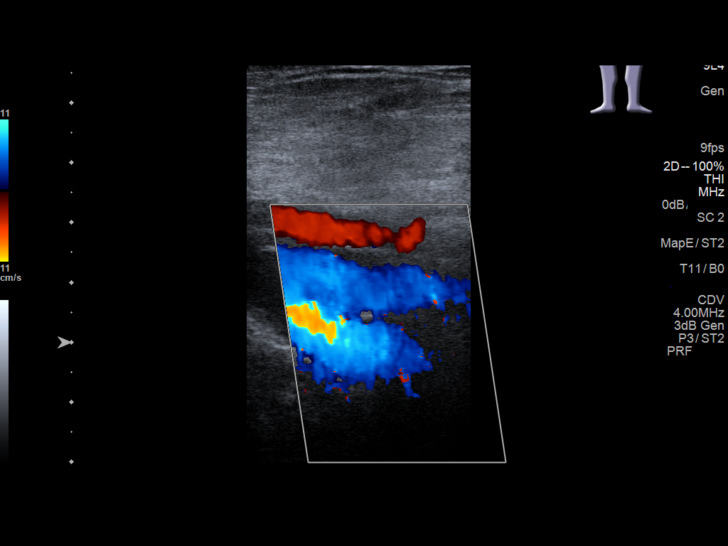
[im 15/29]
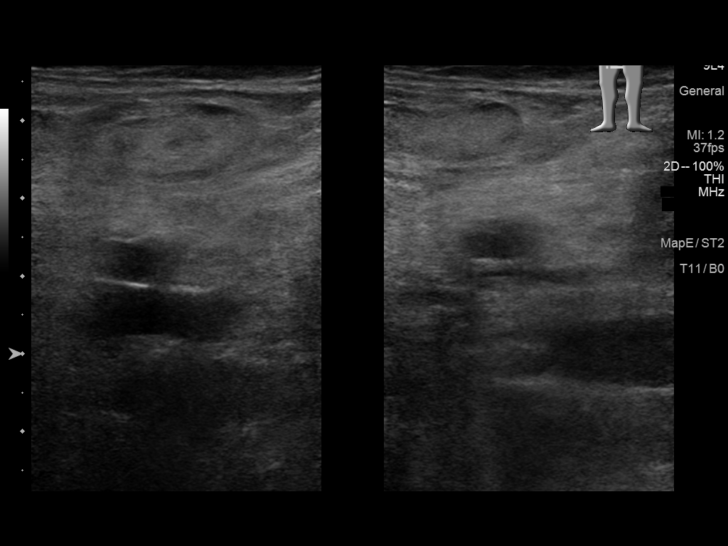
[im 16/29]
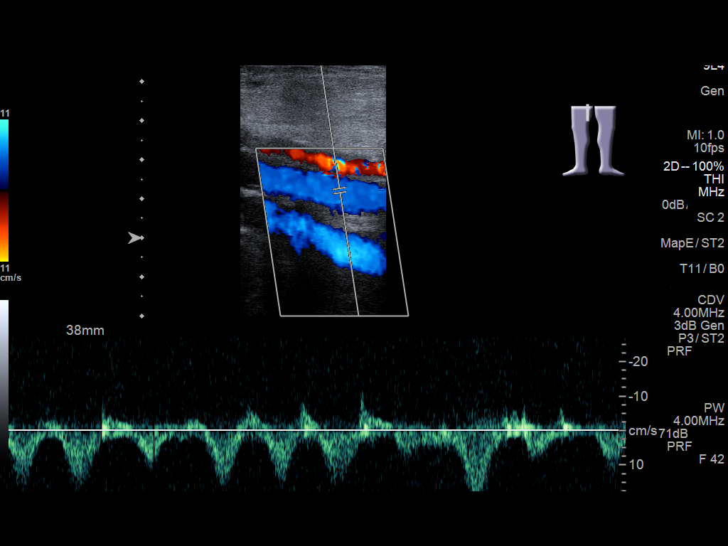
[im 19/29]
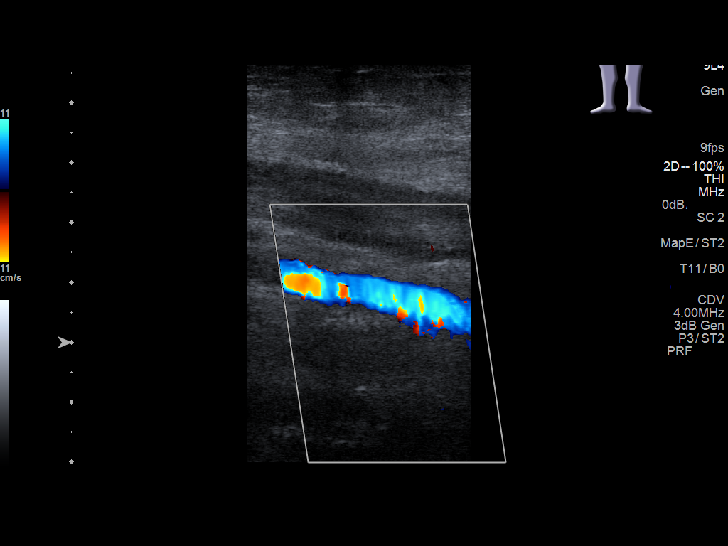
[im 21/29]
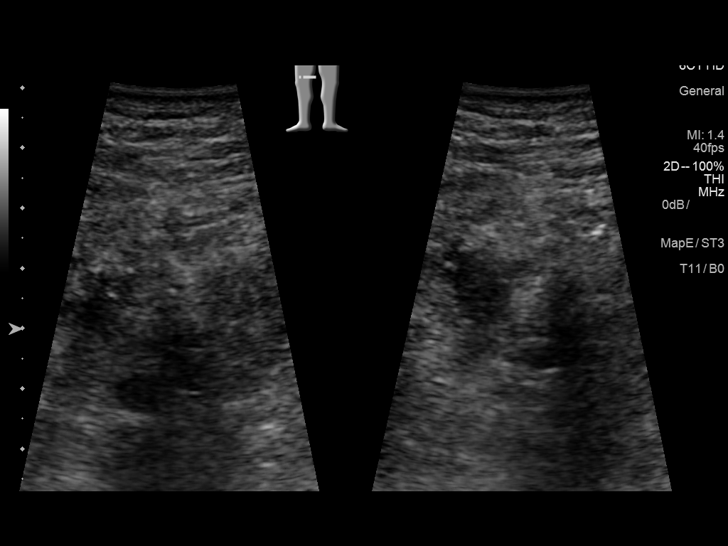
[im 24/29]
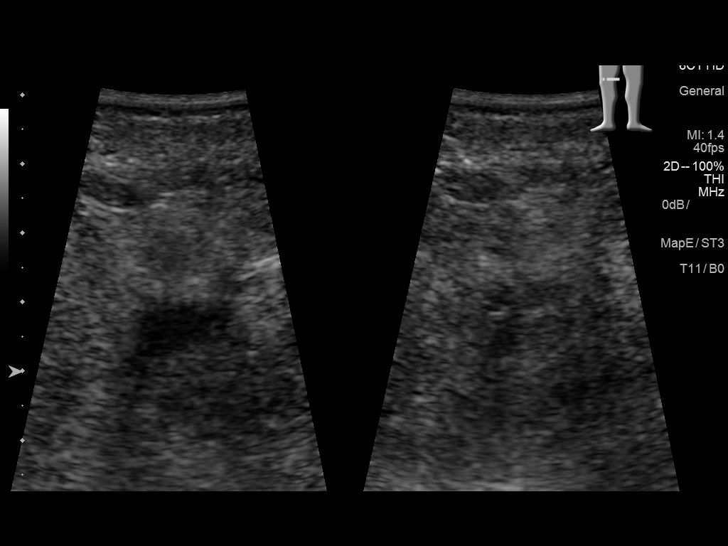
[im 26/29]
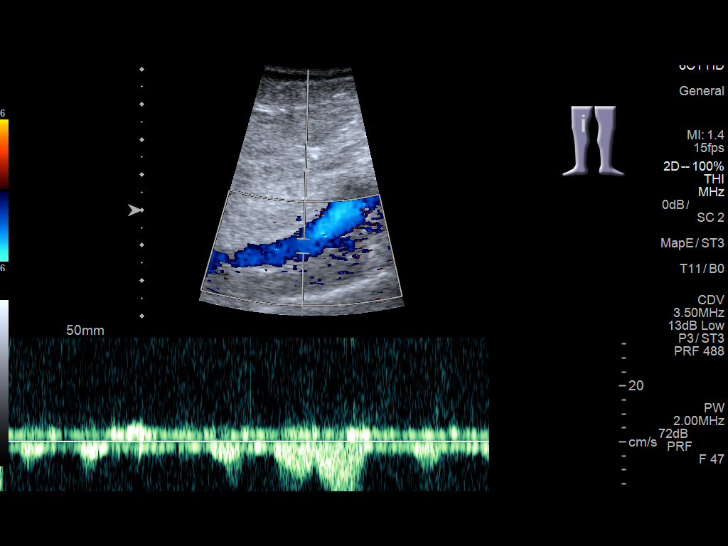
[im 29/29]
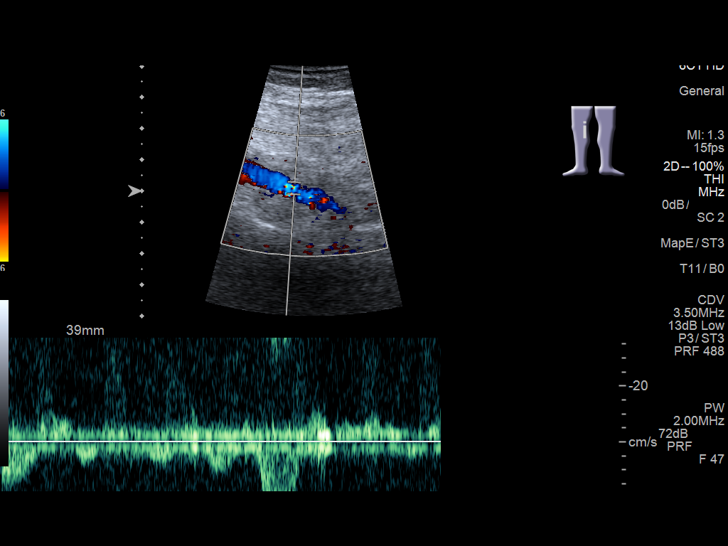

[13 of 24 positions shown; findings below may reference images not displayed]

FINDINGS: Contralateral Common Femoral Vein: Respiratory phasicity is normal
and symmetric with the symptomatic side. No evidence of thrombus.
Normal compressibility.

Common Femoral Vein: No evidence of thrombus.

Saphenofemoral Junction: No evidence of thrombus.

Profunda Femoral Vein: No evidence of thrombus.

Femoral Vein: No evidence of thrombus.

Popliteal Vein: No evidence of thrombus.

Calf Veins: Status post below the knee amputation.
IMPRESSION: No evidence of right lower extremity deep venous thrombosis.
